# Patient Record
Sex: Male | Born: 1950 | Race: White | Hispanic: No | Smoking: Current every day smoker
Health system: Southern US, Community
[De-identification: ages and names within clinical notes are randomized; demographics above are authoritative.]

## PROBLEM LIST (undated history)

## (undated) DIAGNOSIS — R809 Proteinuria, unspecified: Secondary | ICD-10-CM

## (undated) DIAGNOSIS — I10 Essential (primary) hypertension: Secondary | ICD-10-CM

## (undated) DIAGNOSIS — I255 Ischemic cardiomyopathy: Secondary | ICD-10-CM

## (undated) DIAGNOSIS — N189 Chronic kidney disease, unspecified: Secondary | ICD-10-CM

## (undated) DIAGNOSIS — E119 Type 2 diabetes mellitus without complications: Secondary | ICD-10-CM

## (undated) DIAGNOSIS — I2510 Atherosclerotic heart disease of native coronary artery without angina pectoris: Secondary | ICD-10-CM

## (undated) DIAGNOSIS — E875 Hyperkalemia: Secondary | ICD-10-CM

## (undated) DIAGNOSIS — E785 Hyperlipidemia, unspecified: Secondary | ICD-10-CM

## (undated) HISTORY — DX: Hyperlipidemia, unspecified: E78.5

## (undated) HISTORY — PX: SHOULDER SURGERY: SHX246

## (undated) HISTORY — DX: Type 2 diabetes mellitus without complications: E11.9

## (undated) HISTORY — DX: Essential (primary) hypertension: I10

## (undated) HISTORY — DX: Chronic kidney disease, unspecified: N18.9

---

## 1997-05-08 ENCOUNTER — Inpatient Hospital Stay: Admission: AD | Admit: 1997-05-08 | Discharge: 1997-05-11 | Payer: Self-pay | Admitting: Cardiology

## 2003-02-24 ENCOUNTER — Ambulatory Visit (HOSPITAL_BASED_OUTPATIENT_CLINIC_OR_DEPARTMENT_OTHER): Admission: RE | Admit: 2003-02-24 | Discharge: 2003-02-24 | Payer: Self-pay | Admitting: Orthopaedic Surgery

## 2003-02-24 ENCOUNTER — Ambulatory Visit (HOSPITAL_COMMUNITY): Admission: RE | Admit: 2003-02-24 | Discharge: 2003-02-24 | Payer: Self-pay | Admitting: Orthopaedic Surgery

## 2010-05-09 ENCOUNTER — Inpatient Hospital Stay (HOSPITAL_COMMUNITY)
Admission: EM | Admit: 2010-05-09 | Discharge: 2010-05-16 | DRG: 247 | Disposition: A | Discharge status: Home / Self Care | Payer: 59 | Source: Ambulatory Visit | Attending: Cardiology | Admitting: Cardiology

## 2010-05-09 DIAGNOSIS — IMO0001 Reserved for inherently not codable concepts without codable children: Secondary | ICD-10-CM | POA: Diagnosis present

## 2010-05-09 DIAGNOSIS — I252 Old myocardial infarction: Secondary | ICD-10-CM

## 2010-05-09 DIAGNOSIS — F17200 Nicotine dependence, unspecified, uncomplicated: Secondary | ICD-10-CM | POA: Diagnosis present

## 2010-05-09 DIAGNOSIS — Z79899 Other long term (current) drug therapy: Secondary | ICD-10-CM

## 2010-05-09 DIAGNOSIS — I10 Essential (primary) hypertension: Secondary | ICD-10-CM | POA: Diagnosis present

## 2010-05-09 DIAGNOSIS — I2510 Atherosclerotic heart disease of native coronary artery without angina pectoris: Secondary | ICD-10-CM | POA: Diagnosis present

## 2010-05-09 DIAGNOSIS — Z7982 Long term (current) use of aspirin: Secondary | ICD-10-CM

## 2010-05-09 DIAGNOSIS — E7800 Pure hypercholesterolemia, unspecified: Secondary | ICD-10-CM | POA: Diagnosis present

## 2010-05-09 DIAGNOSIS — I2119 ST elevation (STEMI) myocardial infarction involving other coronary artery of inferior wall: Principal | ICD-10-CM | POA: Diagnosis present

## 2010-05-09 DIAGNOSIS — Z8249 Family history of ischemic heart disease and other diseases of the circulatory system: Secondary | ICD-10-CM

## 2010-05-09 LAB — LIPID PANEL
Cholesterol: 209 mg/dL — ABNORMAL HIGH (ref 0–200)
LDL Cholesterol: UNDETERMINED mg/dL (ref 0–99)

## 2010-05-09 LAB — COMPREHENSIVE METABOLIC PANEL
Alkaline Phosphatase: 65 U/L (ref 39–117)
BUN: 18 mg/dL (ref 6–23)
Chloride: 97 mEq/L (ref 96–112)
GFR calc non Af Amer: >60 mL/min (ref >60)
Glucose, Bld: 267 mg/dL — ABNORMAL HIGH (ref 70–99)
Potassium: 3.6 mEq/L (ref 3.5–5.1)
Total Bilirubin: 0.4 mg/dL (ref 0.3–1.2)

## 2010-05-09 LAB — PROTIME-INR: INR: 3.98 — ABNORMAL HIGH (ref 0.00–1.49)

## 2010-05-09 LAB — MRSA PCR SCREENING: MRSA by PCR: NEGATIVE

## 2010-05-09 LAB — CBC
HCT: 42.7 % (ref 39.0–52.0)
MCHC: 35.6 g/dL (ref 30.0–36.0)
RDW: 11.9 % (ref 11.5–15.5)

## 2010-05-09 LAB — CARDIAC PANEL(CRET KIN+CKTOT+MB+TROPI)
CK, MB: 6.9 ng/mL (ref 0.3–4.0)
Total CK: 87 U/L (ref 7–232)

## 2010-05-10 LAB — BASIC METABOLIC PANEL
BUN: 17 mg/dL (ref 6–23)
CO2: 24 mEq/L (ref 19–32)
Chloride: 105 mEq/L (ref 96–112)
Creatinine, Ser: 0.74 mg/dL (ref 0.4–1.5)
GFR calc Af Amer: >60 mL/min (ref >60)
Potassium: 3.4 mEq/L — ABNORMAL LOW (ref 3.5–5.1)

## 2010-05-10 LAB — CARDIAC PANEL(CRET KIN+CKTOT+MB+TROPI)
Relative Index: INVALID (ref 0.0–2.5)
Total CK: 78 U/L (ref 7–232)
Troponin I: 1.85 ng/mL (ref <0.30)

## 2010-05-10 LAB — GLUCOSE, CAPILLARY: Glucose-Capillary: 221 mg/dL — ABNORMAL HIGH (ref 70–99)

## 2010-05-10 LAB — POCT I-STAT, CHEM 8
Calcium, Ion: 1.28 mmol/L (ref 1.12–1.32)
Chloride: 105 mEq/L (ref 96–112)
Creatinine, Ser: 1 mg/dL (ref 0.4–1.5)
Glucose, Bld: 278 mg/dL — ABNORMAL HIGH (ref 70–99)
HCT: 46 % (ref 39.0–52.0)

## 2010-05-10 LAB — HEMOGLOBIN A1C: Hgb A1c MFr Bld: 11.3 % — ABNORMAL HIGH (ref <5.7)

## 2010-05-10 LAB — CBC
MCH: 31.9 pg (ref 26.0–34.0)
MCHC: 34.5 g/dL (ref 30.0–36.0)
Platelets: 132 10*3/uL — ABNORMAL LOW (ref 150–400)

## 2010-05-10 LAB — CK TOTAL AND CKMB (NOT AT ARMC): Relative Index: INVALID (ref 0.0–2.5)

## 2010-05-10 NOTE — Cardiovascular Report (Signed)
NAME:  YANDRIEL, BEHM              ACCOUNT NO.:  [id number]  MEDICAL RECORD NO.:  [id number]           PATIENT TYPE:  I  LOCATION:  2905                         FACILITY:  [facility]  PHYSICIAN:  Allegra Lai. Terrence Dupont, M.D. DATE OF BIRTH:  [birth year]  DATE OF PROCEDURE:  05/09/2010 DATE OF DISCHARGE:                           CARDIAC CATHETERIZATION   PROCEDURES: 1. Left cardiac cath with selective left and right coronary     angiography via right groin using Judkins technique. 2. Successful PTCA to mid RCA using 2.5 x 12 mm long mini Trek     balloon. 3. Successful deployment of 4.0 x 24 mm long Promus Element drug-     eluting stent in mid RCA. 4. Successful postdilatation of this stent using 4.0 x 15 mm long [place]     Trek balloon.  INDICATIONS FOR PROCEDURE:  Mr. Sahaj Raj is a 60 year old white male with past medical history significant for coronary artery disease, history of MI approximately 13 years ago, hypertension, non-insulin- dependent diabetes mellitus, hypercholesteremia, morbid obesity, tobacco abuse, positive family history of coronary artery disease.  Father died at the age of 102 due to MI.  He came to the [place] from [facility] by EMS, complaining of recurrent retrosternal chest pain, radiating to the left arm, associated with diaphoresis and mild shortness of breath since 1 a.m.  He states chest pain resolved in 15-20 minutes, did not seek medical attention, and again this afternoon had similar chest pain associated with nausea, diaphoresis, chest pain radiating to the left shoulder and left arm.  He took 4 baby aspirin with relief of chest pain.  He states stopped all his medications except metformin and aspirin.  PAST MEDICAL HISTORY:  As above.  PAST SURGICAL HISTORY:  He had left shoulder surgery in the past.  ALLERGIES:  No known drug allergies.  MEDICATIONS AT HOME:  He takes aspirin and metformin.  SOCIAL HISTORY:  He is  married, smokes 1-1/2 pack per day.  No history of alcohol abuse.  Works as Games developer.  FAMILY HISTORY:  Positive for coronary artery disease,  PHYSICAL EXAMINATION:  GENERAL:  He was alert, awake, and oriented x3, in no acute distress. VITAL SIGNS:  Blood pressure was 154/90, pulse was 96 and regular. HEENT:  Conjunctivae pink. NECK:  Supple.  No JVD.  No bruit. LUNGS:  Clear to auscultation. CARDIOVASCULAR:  S1 and S2 was normal.  There was soft systolic murmur and S4 gallop. ABDOMEN:  Soft.  Bowel sounds were present, nontender. EXTREMITIES:  There is no clubbing, cyanosis, or edema.  EKG done on the field showed sinus tachycardia with minimal ST elevation in inferior leads with no significant reciprocal changes, also had poor R-wave progression in V1-V3.  Discussed with the patient regarding emergency left cath, possible PTCA stenting, its risks and benefits i.e. death, MI, stroke, need for emergency CABG, local vascular complications, etc., and consented for PCI.  DESCRIPTION OF PROCEDURE:  After obtaining the informed consent, the patient was brought to the cath lab and was placed on fluoroscopy table. The right groin was prepped and draped in usual fashion.  A 1% Xylocaine was used for local anesthesia in the right groin.  With the help of thin- wall needle, a 6-French arterial sheath was placed.  The sheath was aspirated and flushed.  Next, a 6-French right Judkins catheter was advanced over the wire under fluoroscopic guidance up to the ascending aorta.  Wire was pulled out.  The catheter was aspirated and connected to the manifold.  Catheter was further advanced and engaged into right coronary ostium.  The single view of right coronary artery was obtained. Next, the catheter was disengaged and was pulled out over the wire and was replaced with 6-French left Judkins catheter which was advanced over the wire under fluoroscopic guidance up to the ascending aorta.  Wire was  pulled out.  The catheter was aspirated and connected to the manifold.  Catheter was further advanced and engaged into left coronary ostium.  Multiple views of the left system were taken.  Next, the catheter was disengaged and was pulled out over the wire and was replaced with 6-French pigtail catheter which was advanced over the wire under fluoroscopic guidance up to the ascending aorta.  At the end of the procedure, catheter was further advanced across the aortic valve into the LV.  LV pressures were recorded.  Next, LV graft was done in 30- degree RAO position.  Post angiographic pressures were recorded from LV and then pullback pressures were recorded from aorta.  There was no gradient across aortic valve.  Next, the pigtail catheter was pulled out over the wire.  Sheaths were aspirated and flushed.  FINDINGS:  LV showed anterolateral and inferior wall hypokinesia, LVH, EF of 30-35%.  Left main was patent.  The LAD has 20-25% proximal stenosis and 70-75% proximal and mid junction focal stenosis.  Diagonal 1 is small which has 30-40% stenosis.  Diagonal 2 and 3 were very small. Left circumflex has 30-40% proximal stenosis.  OM-1 is very, very small. OM-2 is moderate size which has 20% proximal and 30% mid stenosis.  RCA has 20-25% proximal stenosis and 95-99% mid stenosis with ruptured plaque with haziness with TIMI grade 2 plus distal flow.  INTERVENTIONAL PROCEDURE:  Successful PTCA to mid RCA was done using 2.5 x 12 mm long mini Trek balloon for predilatation and then 4.0 x 24 mm long Promus Element Plus drug-eluting stent was deployed at 11 atmospheric pressure.  Stent was postdilated using 4.0 x 15 mm long [place] Trek balloon, going up to 15 atmospheric pressure.  Lesion dilated from 95 to 99% with ruptured plaque and thrombosed to 0% residual with excellent TIMI grade 3 distal flow without evidence of dissection or distal embolization.  The patient received weight-based Angiomax and  60 mg of Effient and also 5 mg of IV Lopressor q.5 minutes x2 during the procedure.  The patient tolerated procedure well.  There were no complications.  The patient was transferred to CCU in stable condition. Total balloon time was 24 minutes.     Allegra Lai. Terrence Dupont, M.D.     MNH/MEDQ  D:  05/09/2010  T:  05/10/2010  Job:  [id number]  Electronically Signed by Charolette Forward M.D. on 05/10/2010 08:39:02 AM

## 2010-05-11 LAB — GLUCOSE, CAPILLARY
Glucose-Capillary: 183 mg/dL — ABNORMAL HIGH (ref 70–99)
Glucose-Capillary: 198 mg/dL — ABNORMAL HIGH (ref 70–99)
Glucose-Capillary: 172 mg/dL — ABNORMAL HIGH (ref 70–99)

## 2010-05-11 LAB — BASIC METABOLIC PANEL
Chloride: 103 mEq/L (ref 96–112)
GFR calc Af Amer: >60 mL/min (ref >60)
GFR calc non Af Amer: >60 mL/min (ref >60)
Potassium: 3.6 mEq/L (ref 3.5–5.1)
Sodium: 136 mEq/L (ref 135–145)

## 2010-05-11 LAB — CARDIAC PANEL(CRET KIN+CKTOT+MB+TROPI)
CK, MB: 3 ng/mL (ref 0.3–4.0)
Total CK: 44 U/L (ref 7–232)

## 2010-05-11 LAB — CBC
HCT: 41.3 % (ref 39.0–52.0)
Hemoglobin: 14.2 g/dL (ref 13.0–17.0)
MCH: 31.6 pg (ref 26.0–34.0)
MCV: 91.8 fL (ref 78.0–100.0)
Platelets: 124 10*3/uL — ABNORMAL LOW (ref 150–400)
RBC: 4.5 MIL/uL (ref 4.22–5.81)

## 2010-05-12 LAB — GLUCOSE, CAPILLARY
Glucose-Capillary: 195 mg/dL — ABNORMAL HIGH (ref 70–99)
Glucose-Capillary: 111 mg/dL — ABNORMAL HIGH (ref 70–99)

## 2010-05-13 LAB — GLUCOSE, CAPILLARY
Glucose-Capillary: 200 mg/dL — ABNORMAL HIGH (ref 70–99)
Glucose-Capillary: 279 mg/dL — ABNORMAL HIGH (ref 70–99)

## 2010-05-14 LAB — GLUCOSE, CAPILLARY
Glucose-Capillary: 194 mg/dL — ABNORMAL HIGH (ref 70–99)
Glucose-Capillary: 201 mg/dL — ABNORMAL HIGH (ref 70–99)

## 2010-05-14 LAB — CK TOTAL AND CKMB (NOT AT ARMC)
CK, MB: 1.7 ng/mL (ref 0.3–4.0)
Relative Index: INVALID (ref 0.0–2.5)
Total CK: 43 U/L (ref 7–232)

## 2010-05-14 LAB — POCT ACTIVATED CLOTTING TIME: Activated Clotting Time: 364 seconds

## 2010-05-15 LAB — BASIC METABOLIC PANEL
CO2: 24 mEq/L (ref 19–32)
Calcium: 9.2 mg/dL (ref 8.4–10.5)
Creatinine, Ser: 0.68 mg/dL (ref 0.4–1.5)
GFR calc Af Amer: >60 mL/min (ref >60)
GFR calc non Af Amer: >60 mL/min (ref >60)
Sodium: 138 mEq/L (ref 135–145)

## 2010-05-15 LAB — CBC
MCH: 31.8 pg (ref 26.0–34.0)
MCHC: 34.9 g/dL (ref 30.0–36.0)
Platelets: 147 10*3/uL — ABNORMAL LOW (ref 150–400)
RDW: 11.7 % (ref 11.5–15.5)

## 2010-05-15 LAB — GLUCOSE, CAPILLARY
Glucose-Capillary: 253 mg/dL — ABNORMAL HIGH (ref 70–99)
Glucose-Capillary: 173 mg/dL — ABNORMAL HIGH (ref 70–99)

## 2010-05-27 NOTE — Cardiovascular Report (Signed)
NAME:  Austin Weber, Austin Weber              ACCOUNT NO.:  [id number]  MEDICAL RECORD NO.:  [id number]           PATIENT TYPE:  I  LOCATION:  6533                         FACILITY:  [facility]  PHYSICIAN:  Wadell Craddock N. Terrence Dupont, M.D. DATE OF BIRTH:  Jun 09, 1950  DATE OF PROCEDURE:  05/14/2010 DATE OF DISCHARGE:                           CARDIAC CATHETERIZATION   PROCEDURE: 1. Left cardiac cath with selective left and right coronary     angiography via right groin using Judkins technique. 2. Successful PTCA to proximal and mid junction of LAD using 2.5 x 8-     mm long mini Trek balloon. 3. Successful deployment of 3.0 x 16-mm long thrombosed element Plus     drug-eluting stent in proximal and mid junction of LAD. 4. Successful postdilatation of this stent using 3.5 x 12-mm long [place]     Trek balloon going up to 20 atmospheric pressure.  INDICATIONS FOR PROCEDURE:  Austin Weber is a 60 year old white male with past medical history significant for coronary artery disease, history of MI approximately 13 years ago, hypertension, non-insulin-dependent diabetes mellitus, hypercholesteremia, morbid obesity, tobacco abuse, positive family history of coronary artery disease who was admitted on May 09, 2010, as code STEMI was called and the patient was noted to have inferior wall MI requiring emergency PCI to mid RCA.  The patient was noted to have critical stenosis in proximal LAD.  The patient is brought to the cath lab for staged PCI to LAD.  After obtaining the informed consent, the patient was brought to the cath lab and was placed on fluoroscopy table.  Right groin was prepped and draped in usual fashion.  Xylocaine 1% was used for local anesthesia in the right groin.  With the help of thin-wall needle, 6-French arterial sheath was placed.  The sheath was aspirated and flushed. Next, a 6-French right Judkins catheter was advanced over the wire under fluoroscopic guidance up to the ascending aorta.  Wire  was pulled out, the catheter was aspirated and connected to the manifold.  Catheter was further advanced and engaged into right coronary ostium.  The single view of right coronary artery was obtained.  Next, the catheter was disengaged and was pulled out over the wire and was replaced with 6- [place] XB LAD guiding catheter, which was advanced over the wire under fluoroscopic guidance up to the ascending aorta.  Wire was pulled out, the catheter was aspirated and connected to the manifold.  Catheter was further advanced and engaged into left coronary ostium.  Multiple views of the left system were taken.  FINDINGS:  RCA was patent at prior PTCA and stented site.  LAD had 75- 80% proximal and mid junction stenosis as before.  INTERVENTIONAL PROCEDURE:  Successful PTCA to proximal and mid junction of LAD was done using 2.5 x 8-mm long mini Trek balloon for predilatation and then 3.0 x 16-mm long thrombosed element Plus drug- eluting stent was deployed in proximal and mid junction of LAD at 11 atmospheric pressure.  Stent was postdilated using 3.5 x 12-mm long [place] Trek balloon going up to 20 atmospheric pressure.  Lesion dilated from 75-80% to 0%  residual with excellent TIMI grade 3 distal flow without evidence of dissection or distal embolization.  The patient received weight-based Angiomax and 30 mg of prasugrel during the procedure.  Arteriotomy was brought by [facility] without difficulty with good hemostasis.  The patient tolerated procedure well.  There were no complications.  The patient was transferred to recovery room in stable condition.     Allegra Lai. Terrence Dupont, M.D.     MNH/MEDQ  D:  05/15/2010  T:  05/16/2010  Job:  [id number]  Electronically Signed by Charolette Forward M.D. on 05/27/2010 08:55:40 PM

## 2010-05-27 NOTE — Discharge Summary (Signed)
NAME:  Austin Weber, Austin Weber              ACCOUNT NO.:  [id number]  MEDICAL RECORD NO.:  [id number]           PATIENT TYPE:  I  LOCATION:  6533                         FACILITY:  [facility]  PHYSICIAN:  Kadeem Hyle N. Terrence Dupont, M.D. DATE OF BIRTH:  1950/10/20  DATE OF ADMISSION:  05/09/2010 DATE OF DISCHARGE:  05/16/2010                              DISCHARGE SUMMARY   DATE OF TENTATIVE DISCHARGE:  May 16, 2010.  ADMITTING DIAGNOSES: 1. Acute inferior wall myocardial infarction. 2. Coronary artery disease. 3. History of myocardial infarction in the past. 4. Uncontrolled hypertension. 5. Uncontrolled diabetes mellitus. 6. Hypercholesteremia. 7. Morbid obesity.  DISCHARGE DIAGNOSES: 1. Status post inferior wall myocardial infarction. 2. Status post percutaneous coronary intervention to right coronary     artery. 3. Multivessel coronary artery disease, status post percutaneous     transluminal coronary angioplasty/stenting to left anterior     descending. 4. Hypertension. 5. Diabetes mellitus. 6. Hypercholesteremia. 7. Tobacco abuse. 8. Morbid obesity.  DISCHARGE HOME MEDICATIONS: 1. Enteric-coated aspirin 325 mg 1 tablet daily. 2. Prasugrel 10 mg 1 tablet daily. 3. Ramipril 5 mg one capsule twice daily. 4. Carvedilol 6.25 mg 1 tablet twice daily. 5. Crestor 40 mg 1 tablet daily. 6. Metformin 500 mg 1 tablet twice daily. 7. Nitrostat 0.4 mg sublingual q.5 minutes as needed. 8. Avelox 400 mg 1 tablet daily for 5 days.  DIET:  Low-salt, low-cholesterol, 1800-calorie ADA diet.  The patient has been advised to monitor blood sugar and blood pressure daily.  Follow up with me in 1 week.  CONDITION AT DISCHARGE:  Stable.  Post PTCA stent instructions have been given.  BRIEF HISTORY AND HOSPITAL COURSE:  Austin Weber is a 60 year old white male with past medical history significant for coronary artery disease, history of MI approximately 13 years ago, hypertension, non-insulin- dependent  diabetes mellitus, hypercholesteremia, morbid obesity, tobacco abuse, positive family history of coronary artery disease.  He came to [place] from [facility] by EMS complaining of recurrent retrosternal chest pain radiating to the left arm associated with diaphoresis and mild shortness of breath since 1:00 a.m. States chest pain resolved in 15-20 minutes, did not seek any medical attention, and again this afternoon had similar chest pain associated with nausea, diaphoresis, radiating to left shoulder and left arm, four baby aspirin with relief of chest pain.  States stopped all his medication except metformin and aspirin.  PAST MEDICAL HISTORY:  As above.  PAST SURGICAL HISTORY:  He had left shoulder surgery in the past.  ALLERGIES:  No known drug allergies.  MEDICATION AT HOME:  He takes aspirin and metformin.  SOCIAL HISTORY:  He is married.  Smokes one and a half packs per day for many years.  No history of alcohol abuse.  Works as a Games developer.  FAMILY HISTORY:  Positive for coronary artery disease.  Father died of MI at the age of 67.  PHYSICAL EXAMINATION:  GENERAL:  He was alert, awake, and oriented x3 in no acute distress. VITAL SIGNS:  Blood pressure was 154/90, pulse was 96 and regular. HEENT:  Conjunctivae were pink. NECK:  Supple.  No JVD, no bruit. LUNGS:  Clear to auscultation without rhonchi or rales. CARDIOVASCULAR:  S1 and S2 were normal.  There was soft systolic murmur and S4 gallop. ABDOMEN:  Soft.  Bowel sounds were present, nontender. EXTREMITIES:  There is no clubbing, cyanosis, or edema.  EKG showed normal sinus rhythm with sinus tachycardia, minimal ST elevation in inferior leads with no significant reciprocal changes, and also poor R-wave progression in V1-V3.  LABORATORY DATA:  Hemoglobin was 15.2, hematocrit 42.7, white count of 8.3.  Sodium was 129, potassium 3.6, glucose 267, BUN 18, creatinine 0.85.  his cholesterol  was 209, triglycerides 490, HDL 28, CK was 87, MB 6.9, troponin-I was 1.27.  Repeat troponin-I was 2.71.  On May 3, CK 78, MB 7.0, troponin-I was 1.85.  On May 4, CK 44, MB 3.0, troponin-I was 1.07.  On May 7 post PCI, CK 43, MB 1.7.  His hemoglobin A1c was 11.3. His labs today, hemoglobin is 14.7, hematocrit 42.1, white count of 7.1, platelet count 147,000.  Sodium is 138, potassium 3.6, BUN 10, creatinine 0.68, glucose is 148.  BRIEF HOSPITAL COURSE:  The patient was directly brought to the [facility] and underwent emergency left cath and PTCA/stenting to RCA as per procedure report.  The patient tolerated the procedure well.  The patient was noted to have critical proximal LAD stenosis with some haziness.  The patient did not have any episodes of chest pain during the hospital stay.  Phase I Cardiac Rehab was called and the patient subsequently underwent PTCA/stenting to proximal LAD in view of critical stenosis with haziness and poor LV systolic function. The patient tolerated the procedure well.  There were no complications. Postprocedure, the patient has been ambulating in hallway without any problems.  The patient will be discharged home in morning and will be scheduled for Phase II Cardiac Rehab as outpatient.     Allegra Lai. Terrence Dupont, M.D.     MNH/MEDQ  D:  05/15/2010  T:  05/16/2010  Job:  [id number]  Electronically Signed by Charolette Forward M.D. on 05/27/2010 08:55:36 PM

## 2015-02-23 DIAGNOSIS — F17210 Nicotine dependence, cigarettes, uncomplicated: Secondary | ICD-10-CM | POA: Insufficient documentation

## 2018-08-24 ENCOUNTER — Other Ambulatory Visit: Payer: Self-pay

## 2018-08-25 ENCOUNTER — Encounter: Payer: Self-pay | Admitting: Family Medicine

## 2018-08-25 ENCOUNTER — Ambulatory Visit (INDEPENDENT_AMBULATORY_CARE_PROVIDER_SITE_OTHER): Payer: Medicare Other | Admitting: Family Medicine

## 2018-08-25 DIAGNOSIS — N529 Male erectile dysfunction, unspecified: Secondary | ICD-10-CM | POA: Diagnosis not present

## 2018-08-25 DIAGNOSIS — I10 Essential (primary) hypertension: Secondary | ICD-10-CM | POA: Insufficient documentation

## 2018-08-25 DIAGNOSIS — E1159 Type 2 diabetes mellitus with other circulatory complications: Secondary | ICD-10-CM

## 2018-08-25 DIAGNOSIS — E785 Hyperlipidemia, unspecified: Secondary | ICD-10-CM

## 2018-08-25 DIAGNOSIS — E1169 Type 2 diabetes mellitus with other specified complication: Secondary | ICD-10-CM | POA: Insufficient documentation

## 2018-08-25 MED ORDER — METFORMIN HCL ER 500 MG PO TB24: 1000.0000 mg | ORAL_TABLET | Freq: Two times a day (BID) | ORAL | 3 refills | Status: DC

## 2018-08-25 MED ORDER — CARVEDILOL 6.25 MG PO TABS: 6.2500 mg | ORAL_TABLET | Freq: Three times a day (TID) | ORAL | 3 refills | Status: DC

## 2018-08-25 MED ORDER — REPAGLINIDE 2 MG PO TABS: 2.0000 mg | ORAL_TABLET | Freq: Three times a day (TID) | ORAL | 11 refills | Status: DC

## 2018-08-25 MED ORDER — ATORVASTATIN CALCIUM 20 MG PO TABS: 20.0000 mg | ORAL_TABLET | Freq: Every day | ORAL | 3 refills | Status: DC

## 2018-08-25 MED ORDER — LOSARTAN POTASSIUM 100 MG PO TABS: 100.0000 mg | ORAL_TABLET | Freq: Every day | ORAL | 11 refills | Status: DC

## 2018-08-25 MED ORDER — TADALAFIL 5 MG PO TABS: 5.0000 mg | ORAL_TABLET | Freq: Every day | ORAL | 3 refills | Status: DC

## 2018-08-25 MED ORDER — GLIPIZIDE ER 10 MG PO TB24: 10.0000 mg | ORAL_TABLET | Freq: Two times a day (BID) | ORAL | 3 refills | Status: DC

## 2018-08-25 NOTE — Progress Notes (Signed)
BP (!) 188/111   Pulse 100   Temp 98.7 F (37.1 C) (Temporal)   Ht 6' (1.829 m)   Wt 210 lb (95.3 kg)   BMI 28.48 kg/m    Subjective:    Patient ID: Austin Weber, male    DOB: Jun 21, 1950, 68 y.o.   MRN: [id number]  HPI: Austin Weber is a 68 y.o. male presenting on 08/25/2018 for New Patient (Initial Visit) ([place] ) and Establish Care   HPI Type 2 diabetes mellitus Patient comes in today for recheck of his diabetes. Patient has been currently taking glipizide and metformin and Prandin. Patient is currently on an ACE inhibitor/ARB. Patient has not seen an ophthalmologist this year. Patient denies any issues with their feet.   Hypertension Patient is currently on losartan and carvedilol, and their blood pressure today is 188/111, patient says at home they are running in the 120s 130s and 140s and no higher.. Patient denies any lightheadedness or dizziness. Patient denies headaches, blurred vision, chest pains, shortness of breath, or weakness. Denies any side effects from medication and is content with current medication.   Hyperlipidemia Patient is coming in for recheck of his hyperlipidemia. The patient is currently taking atorvastatin. They deny any issues with myalgias or history of liver damage from it. They deny any focal numbness or weakness or chest pain.   Erectile dysfunction Patient currently takes Cialis 5 mg daily for erectile dysfunction.  Patient says it works very well for him and denies any side effects from it.  Relevant past medical, surgical, family and social history reviewed and updated as indicated. Interim medical history since our last visit reviewed. Allergies and medications reviewed and updated.  Review of Systems  Constitutional: Negative for chills and fever.  Eyes: Negative for visual disturbance.  Respiratory: Negative for shortness of breath and wheezing.   Cardiovascular: Negative for chest pain and leg swelling.  Musculoskeletal: Negative  for back pain and gait problem.  Skin: Negative for rash.  Neurological: Negative for dizziness, weakness, light-headedness and numbness.  All other systems reviewed and are negative.   Per HPI unless specifically indicated above  Social History   Socioeconomic History  . Marital status: Married    Spouse name: Not on file  . Number of children: Not on file  . Years of education: Not on file  . Highest education level: Not on file  Occupational History  . Not on file  Social Needs  . Financial resource strain: Not on file  . Food insecurity    Worry: Not on file    Inability: Not on file  . Transportation needs    Medical: Not on file    Non-medical: Not on file  Tobacco Use  . Smoking status: Current Every Day Smoker    Packs/day: 0.50    Types: Cigarettes  . Smokeless tobacco: Never Used  Substance and Sexual Activity  . Alcohol use: Never    Frequency: Never  . Drug use: Never  . Sexual activity: Not on file  Lifestyle  . Physical activity    Days per week: Not on file    Minutes per session: Not on file  . Stress: Not on file  Relationships  . Social Herbalist on phone: Not on file    Gets together: Not on file    Attends religious service: Not on file    Active member of club or organization: Not on file    Attends meetings of clubs or  organizations: Not on file    Relationship status: Not on file  . Intimate partner violence    Fear of current or ex partner: Not on file    Emotionally abused: Not on file    Physically abused: Not on file    Forced sexual activity: Not on file  Other Topics Concern  . Not on file  Social History Narrative  . Not on file    Past Surgical History:  Procedure Laterality Date  . SHOULDER SURGERY Left     Family History  Problem Relation Age of Onset  . Heart disease Father     Allergies as of 08/25/2018   Not on File     Medication List       Accurate as of August 25, 2018  9:56 AM. If you have  any questions, ask your nurse or doctor.        aspirin EC 81 MG tablet Take 81 mg by mouth 2 (two) times daily.   atorvastatin 20 MG tablet Commonly known as: LIPITOR Take 1 tablet by mouth daily.   carvedilol 6.25 MG tablet Commonly known as: COREG Take 1 tablet by mouth 3 (three) times daily.   glipiZIDE 10 MG 24 hr tablet Commonly known as: GLUCOTROL XL Take 1 tablet by mouth 2 (two) times daily.   losartan 100 MG tablet Commonly known as: COZAAR Take 1 tablet by mouth daily.   metFORMIN 500 MG 24 hr tablet Commonly known as: GLUCOPHAGE-XR Take 2 tablets by mouth 2 (two) times daily.   repaglinide 2 MG tablet Commonly known as: PRANDIN Take 1 tablet by mouth 3 (three) times daily.   tadalafil 5 MG tablet Commonly known as: CIALIS Take 1 tablet by mouth daily.          Objective:    BP (!) 188/111   Pulse 100   Temp 98.7 F (37.1 C) (Temporal)   Ht 6' (1.829 m)   Wt 210 lb (95.3 kg)   BMI 28.48 kg/m   Wt Readings from Last 3 Encounters:  08/25/18 210 lb (95.3 kg)    Physical Exam Vitals signs and nursing note reviewed.  Constitutional:      General: He is not in acute distress.    Appearance: He is well-developed. He is not diaphoretic.  Eyes:     General: No scleral icterus.    Conjunctiva/sclera: Conjunctivae normal.  Neck:     Musculoskeletal: Neck supple.     Thyroid: No thyromegaly.  Cardiovascular:     Rate and Rhythm: Normal rate and regular rhythm.     Heart sounds: Normal heart sounds. No murmur.  Pulmonary:     Effort: Pulmonary effort is normal. No respiratory distress.     Breath sounds: Normal breath sounds. No wheezing.  Musculoskeletal: Normal range of motion.  Lymphadenopathy:     Cervical: No cervical adenopathy.  Skin:    General: Skin is warm and dry.     Findings: No rash.  Neurological:     Mental Status: He is alert and oriented to person, place, and time.     Coordination: Coordination normal.  Psychiatric:         Behavior: Behavior normal.         Assessment & Plan:   Problem List Items Addressed This Visit      Cardiovascular and Mediastinum   Hypertension associated with diabetes ([place])   Relevant Medications   aspirin EC 81 MG tablet   atorvastatin (LIPITOR) 20 MG  tablet   carvedilol (COREG) 6.25 MG tablet   glipiZIDE (GLUCOTROL XL) 10 MG 24 hr tablet   losartan (COZAAR) 100 MG tablet   metFORMIN (GLUCOPHAGE-XR) 500 MG 24 hr tablet   repaglinide (PRANDIN) 2 MG tablet   tadalafil (CIALIS) 5 MG tablet   Other Relevant Orders   CMP14+EGFR     Endocrine   Type 2 diabetes mellitus with other specified complication (HCC)   Relevant Medications   aspirin EC 81 MG tablet   atorvastatin (LIPITOR) 20 MG tablet   glipiZIDE (GLUCOTROL XL) 10 MG 24 hr tablet   losartan (COZAAR) 100 MG tablet   metFORMIN (GLUCOPHAGE-XR) 500 MG 24 hr tablet   repaglinide (PRANDIN) 2 MG tablet   Other Relevant Orders   CBC with Differential/Platelet   Bayer DCA Hb A1c Waived   Hyperlipidemia associated with type 2 diabetes mellitus (HCC)   Relevant Medications   aspirin EC 81 MG tablet   atorvastatin (LIPITOR) 20 MG tablet   glipiZIDE (GLUCOTROL XL) 10 MG 24 hr tablet   losartan (COZAAR) 100 MG tablet   metFORMIN (GLUCOPHAGE-XR) 500 MG 24 hr tablet   repaglinide (PRANDIN) 2 MG tablet   Other Relevant Orders   Lipid panel     Other   Erectile dysfunction      Continue medication and recheck in 13mhs bp  Changes in blood pressure because home BPs are better, he will call in some numbers over the next 2 weeks. Follow up plan: Return in about 3 months (around 11/25/2018) for diabetes.  JCaryl Pina MD [facility] 08/25/2018, 9:56 AM

## 2018-11-26 ENCOUNTER — Other Ambulatory Visit: Payer: Medicare Other

## 2018-11-26 ENCOUNTER — Other Ambulatory Visit: Payer: Self-pay

## 2018-11-26 DIAGNOSIS — E1159 Type 2 diabetes mellitus with other circulatory complications: Secondary | ICD-10-CM

## 2018-11-26 DIAGNOSIS — E1169 Type 2 diabetes mellitus with other specified complication: Secondary | ICD-10-CM

## 2018-11-26 DIAGNOSIS — E785 Hyperlipidemia, unspecified: Secondary | ICD-10-CM

## 2018-11-26 LAB — BAYER DCA HB A1C WAIVED: HB A1C (BAYER DCA - WAIVED): 7 % — ABNORMAL HIGH (ref <7.0)

## 2018-11-27 ENCOUNTER — Encounter: Payer: Self-pay | Admitting: Family Medicine

## 2018-11-27 ENCOUNTER — Ambulatory Visit (INDEPENDENT_AMBULATORY_CARE_PROVIDER_SITE_OTHER): Payer: Medicare Other | Admitting: Family Medicine

## 2018-11-27 VITALS — BP 194/106 | HR 97 | Temp 98.0°F | Ht 73.0 in | Wt 209.8 lb

## 2018-11-27 DIAGNOSIS — E1169 Type 2 diabetes mellitus with other specified complication: Secondary | ICD-10-CM

## 2018-11-27 DIAGNOSIS — I10 Essential (primary) hypertension: Secondary | ICD-10-CM

## 2018-11-27 DIAGNOSIS — I152 Hypertension secondary to endocrine disorders: Secondary | ICD-10-CM

## 2018-11-27 DIAGNOSIS — E1159 Type 2 diabetes mellitus with other circulatory complications: Secondary | ICD-10-CM

## 2018-11-27 DIAGNOSIS — N179 Acute kidney failure, unspecified: Secondary | ICD-10-CM

## 2018-11-27 DIAGNOSIS — Z23 Encounter for immunization: Secondary | ICD-10-CM

## 2018-11-27 DIAGNOSIS — E785 Hyperlipidemia, unspecified: Secondary | ICD-10-CM

## 2018-11-27 LAB — CMP14+EGFR
ALT: 13 IU/L (ref 0–44)
AST: 12 IU/L (ref 0–40)
Albumin/Globulin Ratio: 1.8 (ref 1.2–2.2)
Albumin: 4.5 g/dL (ref 3.8–4.8)
Alkaline Phosphatase: 85 IU/L (ref 39–117)
BUN/Creatinine Ratio: 22 (ref 10–24)
BUN: 30 mg/dL — ABNORMAL HIGH (ref 8–27)
Bilirubin Total: 0.4 mg/dL (ref 0.0–1.2)
CO2: 18 mmol/L — ABNORMAL LOW (ref 20–29)
Calcium: 9.6 mg/dL (ref 8.6–10.2)
Chloride: 108 mmol/L — ABNORMAL HIGH (ref 96–106)
Creatinine, Ser: 1.35 mg/dL — ABNORMAL HIGH (ref 0.76–1.27)
GFR calc Af Amer: 61 mL/min/{1.73_m2} (ref >59)
GFR calc non Af Amer: 53 mL/min/{1.73_m2} — ABNORMAL LOW (ref >59)
Globulin, Total: 2.5 g/dL (ref 1.5–4.5)
Glucose: 106 mg/dL — ABNORMAL HIGH (ref 65–99)
Potassium: 4.8 mmol/L (ref 3.5–5.2)
Sodium: 142 mmol/L (ref 134–144)
Total Protein: 7 g/dL (ref 6.0–8.5)

## 2018-11-27 LAB — CBC WITH DIFFERENTIAL/PLATELET
Basophils Absolute: 0.1 10*3/uL (ref 0.0–0.2)
Basos: 1 %
EOS (ABSOLUTE): 0.2 10*3/uL (ref 0.0–0.4)
Eos: 3 %
Hematocrit: 45.5 % (ref 37.5–51.0)
Hemoglobin: 15.1 g/dL (ref 13.0–17.7)
Immature Grans (Abs): 0 10*3/uL (ref 0.0–0.1)
Immature Granulocytes: 0 %
Lymphocytes Absolute: 2.1 10*3/uL (ref 0.7–3.1)
Lymphs: 25 %
MCH: 31.1 pg (ref 26.6–33.0)
MCHC: 33.2 g/dL (ref 31.5–35.7)
MCV: 94 fL (ref 79–97)
Monocytes Absolute: 0.7 10*3/uL (ref 0.1–0.9)
Monocytes: 8 %
Neutrophils Absolute: 5.4 10*3/uL (ref 1.4–7.0)
Neutrophils: 63 %
Platelets: 184 10*3/uL (ref 150–450)
RBC: 4.85 x10E6/uL (ref 4.14–5.80)
RDW: 11.7 % (ref 11.6–15.4)
WBC: 8.5 10*3/uL (ref 3.4–10.8)

## 2018-11-27 LAB — LIPID PANEL
Chol/HDL Ratio: 3.8 ratio (ref 0.0–5.0)
Cholesterol, Total: 137 mg/dL (ref 100–199)
HDL: 36 mg/dL — ABNORMAL LOW (ref >39)
LDL Chol Calc (NIH): 74 mg/dL (ref 0–99)
Triglycerides: 156 mg/dL — ABNORMAL HIGH (ref 0–149)
VLDL Cholesterol Cal: 27 mg/dL (ref 5–40)

## 2018-11-27 MED ORDER — AMLODIPINE BESYLATE 5 MG PO TABS: 5.0000 mg | ORAL_TABLET | Freq: Every day | ORAL | 3 refills | Status: DC

## 2018-11-27 NOTE — Progress Notes (Signed)
BP (!) 202/102   Pulse 97   Temp 98 F (36.7 C) (Temporal)   Ht '6\' 1"'  (1.854 m)   Wt 209 lb 12.8 oz (95.2 kg)   SpO2 98%   BMI 27.68 kg/m    Subjective:   Patient ID: Austin Weber, male    DOB: 07-14-1950, 68 y.o.   MRN: [id number]  HPI: Austin Weber is a 68 y.o. male presenting on 11/27/2018 for Diabetes (3 month follow up)   HPI Type 2 diabetes mellitus Patient comes in today for recheck of his diabetes. Patient has been currently taking glipizide and metformin and repaglinide. Patient is currently on an ACE inhibitor/ARB. Patient has not seen an ophthalmologist this year. Patient denies any issues with their feet.   Hypertension Patient is currently on carvedilol and losartan, and their blood pressure today is 202/102. Patient denies any lightheadedness or dizziness. Patient denies headaches, blurred vision, chest pains, shortness of breath, or weakness. Denies any side effects from medication and is content with current medication.   Hyperlipidemia Patient is coming in for recheck of his hyperlipidemia. The patient is currently taking Lipitor. They deny any issues with myalgias or history of liver damage from it. They deny any focal numbness or weakness or chest pain.   Relevant past medical, surgical, family and social history reviewed and updated as indicated. Interim medical history since our last visit reviewed. Allergies and medications reviewed and updated.  Review of Systems  Constitutional: Negative for chills and fever.  Eyes: Negative for discharge.  Respiratory: Negative for shortness of breath and wheezing.   Cardiovascular: Negative for chest pain and leg swelling.  Musculoskeletal: Negative for back pain and gait problem.  Skin: Negative for rash.  All other systems reviewed and are negative.   Per HPI unless specifically indicated above   Allergies as of 11/27/2018   No Known Allergies     Medication List       Accurate as of November 27, 2018  8:55 AM. If you have any questions, ask your nurse or doctor.        amLODipine 5 MG tablet Commonly known as: NORVASC Take 1 tablet (5 mg total) by mouth daily. Started by: Worthy Rancher, MD   aspirin EC 81 MG tablet Take 81 mg by mouth 2 (two) times daily.   atorvastatin 20 MG tablet Commonly known as: LIPITOR Take 1 tablet (20 mg total) by mouth daily.   carvedilol 6.25 MG tablet Commonly known as: COREG Take 1 tablet (6.25 mg total) by mouth 3 (three) times daily.   glipiZIDE 10 MG 24 hr tablet Commonly known as: GLUCOTROL XL Take 1 tablet (10 mg total) by mouth 2 (two) times daily.   losartan 100 MG tablet Commonly known as: COZAAR Take 1 tablet (100 mg total) by mouth daily.   metFORMIN 500 MG 24 hr tablet Commonly known as: GLUCOPHAGE-XR Take 2 tablets (1,000 mg total) by mouth 2 (two) times daily.   repaglinide 2 MG tablet Commonly known as: PRANDIN Take 1 tablet (2 mg total) by mouth 3 (three) times daily.   tadalafil 5 MG tablet Commonly known as: CIALIS Take 1 tablet (5 mg total) by mouth daily.        Objective:   BP (!) 202/102   Pulse 97   Temp 98 F (36.7 C) (Temporal)   Ht '6\' 1"'  (1.854 m)   Wt 209 lb 12.8 oz (95.2 kg)   SpO2 98%   BMI 27.68 kg/m  Wt Readings from Last 3 Encounters:  11/27/18 209 lb 12.8 oz (95.2 kg)  08/25/18 210 lb (95.3 kg)    Physical Exam Vitals signs and nursing note reviewed.  Constitutional:      General: He is not in acute distress.    Appearance: He is well-developed. He is not diaphoretic.  Eyes:     General: No scleral icterus.    Conjunctiva/sclera: Conjunctivae normal.  Neck:     Musculoskeletal: Neck supple.     Thyroid: No thyromegaly.  Cardiovascular:     Rate and Rhythm: Normal rate and regular rhythm.     Heart sounds: Normal heart sounds. No murmur.  Pulmonary:     Effort: Pulmonary effort is normal. No respiratory distress.     Breath sounds: Normal breath sounds. No wheezing.   Musculoskeletal: Normal range of motion.  Lymphadenopathy:     Cervical: No cervical adenopathy.  Skin:    General: Skin is warm and dry.     Findings: No rash.  Neurological:     Mental Status: He is alert and oriented to person, place, and time.     Coordination: Coordination normal.  Psychiatric:        Behavior: Behavior normal.     Results for orders placed or performed in visit on 11/26/18  Lipid panel  Result Value Ref Range   Cholesterol, Total 137 100 - 199 mg/dL   Triglycerides 156 (H) 0 - 149 mg/dL   HDL 36 (L) >39 mg/dL   VLDL Cholesterol Cal 27 5 - 40 mg/dL   LDL Chol Calc (NIH) 74 0 - 99 mg/dL   Chol/HDL Ratio 3.8 0.0 - 5.0 ratio  CBC with Differential/Platelet  Result Value Ref Range   WBC 8.5 3.4 - 10.8 x10E3/uL   RBC 4.85 4.14 - 5.80 x10E6/uL   Hemoglobin 15.1 13.0 - 17.7 g/dL   Hematocrit 45.5 37.5 - 51.0 %   MCV 94 79 - 97 fL   MCH 31.1 26.6 - 33.0 pg   MCHC 33.2 31.5 - 35.7 g/dL   RDW 11.7 11.6 - 15.4 %   Platelets 184 150 - 450 x10E3/uL   Neutrophils 63 Not Estab. %   Lymphs 25 Not Estab. %   Monocytes 8 Not Estab. %   Eos 3 Not Estab. %   Basos 1 Not Estab. %   Neutrophils Absolute 5.4 1.4 - 7.0 x10E3/uL   Lymphocytes Absolute 2.1 0.7 - 3.1 x10E3/uL   Monocytes Absolute 0.7 0.1 - 0.9 x10E3/uL   EOS (ABSOLUTE) 0.2 0.0 - 0.4 x10E3/uL   Basophils Absolute 0.1 0.0 - 0.2 x10E3/uL   Immature Granulocytes 0 Not Estab. %   Immature Grans (Abs) 0.0 0.0 - 0.1 x10E3/uL  CMP14+EGFR  Result Value Ref Range   Glucose 106 (H) 65 - 99 mg/dL   BUN 30 (H) 8 - 27 mg/dL   Creatinine, Ser 1.35 (H) 0.76 - 1.27 mg/dL   GFR calc non Af Amer 53 (L) >59 mL/min/1.73   GFR calc Af Amer 61 >59 mL/min/1.73   BUN/Creatinine Ratio 22 10 - 24   Sodium 142 134 - 144 mmol/L   Potassium 4.8 3.5 - 5.2 mmol/L   Chloride 108 (H) 96 - 106 mmol/L   CO2 18 (L) 20 - 29 mmol/L   Calcium 9.6 8.6 - 10.2 mg/dL   Total Protein 7.0 6.0 - 8.5 g/dL   Albumin 4.5 3.8 - 4.8 g/dL    Globulin, Total 2.5 1.5 - 4.5 g/dL  Albumin/Globulin Ratio 1.8 1.2 - 2.2   Bilirubin Total 0.4 0.0 - 1.2 mg/dL   Alkaline Phosphatase 85 39 - 117 IU/L   AST 12 0 - 40 IU/L   ALT 13 0 - 44 IU/L  hgba1c  Result Value Ref Range   HB A1C (BAYER DCA - WAIVED) 7.0 (H) <7.0 %    Assessment & Plan:   Problem List Items Addressed This Visit      Cardiovascular and Mediastinum   Hypertension associated with diabetes ([place]) - Primary   Relevant Medications   amLODipine (NORVASC) 5 MG tablet     Endocrine   Type 2 diabetes mellitus with other specified complication (HCC)   Relevant Orders   hgba1c   CMP14+EGFR   BMP8+EGFR   Hyperlipidemia associated with type 2 diabetes mellitus ([place])   Relevant Orders   Lipid panel    Other Visit Diagnoses    AKI (acute kidney injury) ([place])       Relevant Orders   BMP8+EGFR      Will add amlodipine to his regiment, continue current medication for diabetes, A1c was 7.0.  The rest of his blood work looks pretty good, his renal function was up slightly and we will recheck that today  Follow up plan: Return in about 3 months (around 02/27/2019), or if symptoms worsen or fail to improve, for Hypertension and diabetes and cholesterol.  Counseling provided for all of the vaccine components Orders Placed This Encounter  Procedures  . hgba1c  . CMP14+EGFR  . Lipid panel    Caryl Pina, MD [facility] 11/27/2018, 8:55 AM

## 2019-03-03 ENCOUNTER — Other Ambulatory Visit: Payer: Self-pay

## 2019-03-03 ENCOUNTER — Other Ambulatory Visit: Payer: Medicare Other

## 2019-03-03 DIAGNOSIS — E785 Hyperlipidemia, unspecified: Secondary | ICD-10-CM | POA: Diagnosis not present

## 2019-03-03 DIAGNOSIS — E1169 Type 2 diabetes mellitus with other specified complication: Secondary | ICD-10-CM | POA: Diagnosis not present

## 2019-03-03 LAB — BAYER DCA HB A1C WAIVED: HB A1C (BAYER DCA - WAIVED): 7.3 % — ABNORMAL HIGH (ref <7.0)

## 2019-03-04 ENCOUNTER — Encounter: Payer: Self-pay | Admitting: Family Medicine

## 2019-03-04 ENCOUNTER — Ambulatory Visit (INDEPENDENT_AMBULATORY_CARE_PROVIDER_SITE_OTHER): Payer: Medicare Other | Admitting: Family Medicine

## 2019-03-04 VITALS — BP 195/93 | HR 100 | Temp 98.4°F | Ht 73.0 in | Wt 210.0 lb

## 2019-03-04 DIAGNOSIS — N183 Chronic kidney disease, stage 3 unspecified: Secondary | ICD-10-CM

## 2019-03-04 DIAGNOSIS — E1122 Type 2 diabetes mellitus with diabetic chronic kidney disease: Secondary | ICD-10-CM | POA: Diagnosis not present

## 2019-03-04 DIAGNOSIS — E1159 Type 2 diabetes mellitus with other circulatory complications: Secondary | ICD-10-CM | POA: Diagnosis not present

## 2019-03-04 DIAGNOSIS — I10 Essential (primary) hypertension: Secondary | ICD-10-CM

## 2019-03-04 DIAGNOSIS — E785 Hyperlipidemia, unspecified: Secondary | ICD-10-CM | POA: Diagnosis not present

## 2019-03-04 DIAGNOSIS — E1169 Type 2 diabetes mellitus with other specified complication: Secondary | ICD-10-CM | POA: Diagnosis not present

## 2019-03-04 DIAGNOSIS — N189 Chronic kidney disease, unspecified: Secondary | ICD-10-CM | POA: Insufficient documentation

## 2019-03-04 LAB — CMP14+EGFR
ALT: 15 IU/L (ref 0–44)
AST: 15 IU/L (ref 0–40)
Albumin/Globulin Ratio: 1.8 (ref 1.2–2.2)
Albumin: 4.2 g/dL (ref 3.8–4.8)
Alkaline Phosphatase: 83 IU/L (ref 39–117)
BUN/Creatinine Ratio: 24 (ref 10–24)
BUN: 40 mg/dL — ABNORMAL HIGH (ref 8–27)
Bilirubin Total: 0.5 mg/dL (ref 0.0–1.2)
CO2: 18 mmol/L — ABNORMAL LOW (ref 20–29)
Calcium: 9.4 mg/dL (ref 8.6–10.2)
Chloride: 106 mmol/L (ref 96–106)
Creatinine, Ser: 1.66 mg/dL — ABNORMAL HIGH (ref 0.76–1.27)
GFR calc Af Amer: 48 mL/min/{1.73_m2} — ABNORMAL LOW (ref >59)
GFR calc non Af Amer: 41 mL/min/{1.73_m2} — ABNORMAL LOW (ref >59)
Globulin, Total: 2.4 g/dL (ref 1.5–4.5)
Glucose: 117 mg/dL — ABNORMAL HIGH (ref 65–99)
Potassium: 4.8 mmol/L (ref 3.5–5.2)
Sodium: 140 mmol/L (ref 134–144)
Total Protein: 6.6 g/dL (ref 6.0–8.5)

## 2019-03-04 LAB — LIPID PANEL
Chol/HDL Ratio: 4 ratio (ref 0.0–5.0)
Cholesterol, Total: 140 mg/dL (ref 100–199)
HDL: 35 mg/dL — ABNORMAL LOW (ref >39)
LDL Chol Calc (NIH): 76 mg/dL (ref 0–99)
Triglycerides: 168 mg/dL — ABNORMAL HIGH (ref 0–149)
VLDL Cholesterol Cal: 29 mg/dL (ref 5–40)

## 2019-03-04 MED ORDER — AMLODIPINE BESYLATE 10 MG PO TABS: 10.0000 mg | ORAL_TABLET | Freq: Every day | ORAL | 3 refills | Status: DC

## 2019-03-04 NOTE — Progress Notes (Signed)
BP (!) 195/93   Pulse 100   Temp 98.4 F (36.9 C) (Temporal)   Ht '6\' 1"'  (1.854 m)   Wt 210 lb (95.3 kg)   SpO2 96%   BMI 27.71 kg/m    Subjective:   Patient ID: Austin Weber, male    DOB: 02/28/1950, 69 y.o.   MRN: [id number]  HPI: Austin Weber is a 69 y.o. male presenting on 03/04/2019 for Diabetes (3 month follow up)   HPI Type 2 diabetes mellitus Patient comes in today for recheck of his diabetes. Patient has been currently taking glipizide and Metformin and Prandin, A1c yesterday was 7.3 which is slightly up. Patient is currently on an ACE inhibitor/ARB. Patient has not seen an ophthalmologist this year. Patient denies any issues with their feet.  Patient is showing signs of early CKD over the past 6 months, but likely due to elevated blood pressure, will just get blood pressure down and see if that improves it.  Hypertension Patient is currently on amlodipine 5 and carvedilol and losartan, and their blood pressure today is 195/93. Patient denies any lightheadedness or dizziness. Patient denies headaches, blurred vision, chest pains, shortness of breath, or weakness. Denies any side effects from medication and is content with current medication.   Hyperlipidemia Patient is coming in for recheck of his hyperlipidemia. The patient is currently taking atorvastatin. They deny any issues with myalgias or history of liver damage from it. They deny any focal numbness or weakness or chest pain.   Relevant past medical, surgical, family and social history reviewed and updated as indicated. Interim medical history since our last visit reviewed. Allergies and medications reviewed and updated.  Review of Systems  Constitutional: Negative for chills and fever.  Eyes: Negative for visual disturbance.  Respiratory: Negative for shortness of breath and wheezing.   Cardiovascular: Negative for chest pain and leg swelling.  Musculoskeletal: Negative for back pain and gait problem.  Skin:  Negative for rash.  Neurological: Negative for dizziness, weakness and light-headedness.  All other systems reviewed and are negative.   Per HPI unless specifically indicated above   Allergies as of 03/04/2019   No Known Allergies     Medication List       Accurate as of March 04, 2019  8:27 AM. If you have any questions, ask your nurse or doctor.        amLODipine 10 MG tablet Commonly known as: NORVASC Take 1 tablet (10 mg total) by mouth daily. What changed:   medication strength  how much to take Changed by: Worthy Rancher, MD   aspirin EC 81 MG tablet Take 81 mg by mouth 2 (two) times daily.   atorvastatin 20 MG tablet Commonly known as: LIPITOR Take 1 tablet (20 mg total) by mouth daily.   carvedilol 6.25 MG tablet Commonly known as: COREG Take 1 tablet (6.25 mg total) by mouth 3 (three) times daily.   glipiZIDE 10 MG 24 hr tablet Commonly known as: GLUCOTROL XL Take 1 tablet (10 mg total) by mouth 2 (two) times daily.   losartan 100 MG tablet Commonly known as: COZAAR Take 1 tablet (100 mg total) by mouth daily.   metFORMIN 500 MG 24 hr tablet Commonly known as: GLUCOPHAGE-XR Take 2 tablets (1,000 mg total) by mouth 2 (two) times daily.   repaglinide 2 MG tablet Commonly known as: PRANDIN Take 1 tablet (2 mg total) by mouth 3 (three) times daily.   tadalafil 5 MG tablet Commonly known as:  CIALIS Take 1 tablet (5 mg total) by mouth daily.        Objective:   BP (!) 195/93   Pulse 100   Temp 98.4 F (36.9 C) (Temporal)   Ht '6\' 1"'  (1.854 m)   Wt 210 lb (95.3 kg)   SpO2 96%   BMI 27.71 kg/m   Wt Readings from Last 3 Encounters:  03/04/19 210 lb (95.3 kg)  11/27/18 209 lb 12.8 oz (95.2 kg)  08/25/18 210 lb (95.3 kg)    Physical Exam Vitals and nursing note reviewed.  Constitutional:      General: He is not in acute distress.    Appearance: He is well-developed. He is not diaphoretic.  Eyes:     General: No scleral  icterus.    Conjunctiva/sclera: Conjunctivae normal.  Neck:     Thyroid: No thyromegaly.  Cardiovascular:     Rate and Rhythm: Normal rate and regular rhythm.     Heart sounds: Normal heart sounds. No murmur.  Pulmonary:     Effort: Pulmonary effort is normal. No respiratory distress.     Breath sounds: Normal breath sounds. No wheezing.  Abdominal:     General: Abdomen is flat. Bowel sounds are normal. There is no distension.     Tenderness: There is no abdominal tenderness. There is no right CVA tenderness, left CVA tenderness, guarding or rebound.  Musculoskeletal:        General: Normal range of motion.     Cervical back: Neck supple.  Lymphadenopathy:     Cervical: No cervical adenopathy.  Skin:    General: Skin is warm and dry.     Findings: No rash.  Neurological:     Mental Status: He is alert and oriented to person, place, and time.     Coordination: Coordination normal.  Psychiatric:        Behavior: Behavior normal.     Results for orders placed or performed in visit on 03/03/19  Lipid panel  Result Value Ref Range   Cholesterol, Total 140 100 - 199 mg/dL   Triglycerides 168 (H) 0 - 149 mg/dL   HDL 35 (L) >39 mg/dL   VLDL Cholesterol Cal 29 5 - 40 mg/dL   LDL Chol Calc (NIH) 76 0 - 99 mg/dL   Chol/HDL Ratio 4.0 0.0 - 5.0 ratio  CMP14+EGFR  Result Value Ref Range   Glucose 117 (H) 65 - 99 mg/dL   BUN 40 (H) 8 - 27 mg/dL   Creatinine, Ser 1.66 (H) 0.76 - 1.27 mg/dL   GFR calc non Af Amer 41 (L) >59 mL/min/1.73   GFR calc Af Amer 48 (L) >59 mL/min/1.73   BUN/Creatinine Ratio 24 10 - 24   Sodium 140 134 - 144 mmol/L   Potassium 4.8 3.5 - 5.2 mmol/L   Chloride 106 96 - 106 mmol/L   CO2 18 (L) 20 - 29 mmol/L   Calcium 9.4 8.6 - 10.2 mg/dL   Total Protein 6.6 6.0 - 8.5 g/dL   Albumin 4.2 3.8 - 4.8 g/dL   Globulin, Total 2.4 1.5 - 4.5 g/dL   Albumin/Globulin Ratio 1.8 1.2 - 2.2   Bilirubin Total 0.5 0.0 - 1.2 mg/dL   Alkaline Phosphatase 83 39 - 117 IU/L    AST 15 0 - 40 IU/L   ALT 15 0 - 44 IU/L  hgba1c  Result Value Ref Range   HB A1C (BAYER DCA - WAIVED) 7.3 (H) <7.0 %    Assessment & Plan:  Problem List Items Addressed This Visit      Cardiovascular and Mediastinum   Hypertension associated with diabetes ([place])   Relevant Medications   amLODipine (NORVASC) 10 MG tablet   Other Relevant Orders   CMP14+EGFR     Endocrine   Type 2 diabetes mellitus with other specified complication ([place]) - Primary   Relevant Orders   Bayer DCA Hb A1c Waived   CMP14+EGFR   Hyperlipidemia associated with type 2 diabetes mellitus ([place])   Relevant Orders   Lipid panel   CKD stage 3 due to type 2 diabetes mellitus ([place])   Relevant Orders   CBC with Differential/Platelet   CMP14+EGFR      Increased amlodipine to 10 mg to see if helps blood pressure and will monitor closely kidney function.  A1c 7.3 the rest of blood work looks pretty good, no other medication changes Follow up plan: Return in about 3 months (around 06/01/2019), or if symptoms worsen or fail to improve, for Hypertension and diabetes.  Counseling provided for all of the vaccine components Orders Placed This Encounter  Procedures  . Bayer DCA Hb A1c Waived  . CBC with Differential/Platelet  . CMP14+EGFR  . Lipid panel    Caryl Pina, MD [facility] 03/04/2019, 8:27 AM

## 2019-03-08 ENCOUNTER — Ambulatory Visit: Payer: Medicare Other

## 2019-04-01 ENCOUNTER — Ambulatory Visit (INDEPENDENT_AMBULATORY_CARE_PROVIDER_SITE_OTHER): Payer: Medicare Other | Admitting: *Deleted

## 2019-04-01 DIAGNOSIS — Z0000 Encounter for general adult medical examination without abnormal findings: Secondary | ICD-10-CM | POA: Diagnosis not present

## 2019-04-01 NOTE — Progress Notes (Signed)
MEDICARE ANNUAL WELLNESS VISIT  04/01/2019  Telephone Visit Disclaimer This Medicare AWV was conducted by telephone due to national recommendations for restrictions regarding the COVID-19 Pandemic (e.g. social distancing).  I verified, using two identifiers, that I am speaking with Austin Weber or their authorized healthcare agent. I discussed the limitations, risks, security, and privacy concerns of performing an evaluation and management service by telephone and the potential availability of an in-person appointment in the future. The patient expressed understanding and agreed to proceed.   Subjective:  Austin Weber is a 69 y.o. male patient of Dettinger, Fransisca Kaufmann, MD who had a Medicare Annual Wellness Visit today via telephone. Austin Weber is Retired and lives alone. He has 1 children. He reports that he is socially active and does interact with friends/family regularly. He is not physically active and enjoys fishing.  Patient Care Team: Dettinger, Fransisca Kaufmann, MD as PCP - General (Family Medicine)  Advanced Directives 04/01/2019  Does Patient Have a Medical Advance Directive? No;Yes  Type of Advance Directive Living will  Does patient want to make changes to medical advance directive? No - Patient declined    Hospital Utilization Over the Past 12 Months: # of hospitalizations or ER visits: 0 # of surgeries: 0  Review of Systems    Patient reports that his overall health is unchanged compared to last year.  History obtained from chart review and the patient  Patient Reported Readings (BP, Pulse, CBG, Weight, etc) CBG-108  Pain Assessment Pain : No/denies pain     Current Medications & Allergies (verified) Allergies as of 04/01/2019   No Known Allergies     Medication List       Accurate as of April 01, 2019  8:36 AM. If you have any questions, ask your nurse or doctor.        amLODipine 10 MG tablet Commonly known as: NORVASC Take 1 tablet (10 mg total) by mouth  daily.   aspirin EC 81 MG tablet Take 81 mg by mouth 2 (two) times daily.   atorvastatin 20 MG tablet Commonly known as: LIPITOR Take 1 tablet (20 mg total) by mouth daily.   carvedilol 6.25 MG tablet Commonly known as: COREG Take 1 tablet (6.25 mg total) by mouth 3 (three) times daily.   glipiZIDE 10 MG 24 hr tablet Commonly known as: GLUCOTROL XL Take 1 tablet (10 mg total) by mouth 2 (two) times daily.   losartan 100 MG tablet Commonly known as: COZAAR Take 1 tablet (100 mg total) by mouth daily.   metFORMIN 500 MG 24 hr tablet Commonly known as: GLUCOPHAGE-XR Take 2 tablets (1,000 mg total) by mouth 2 (two) times daily.   repaglinide 2 MG tablet Commonly known as: PRANDIN Take 1 tablet (2 mg total) by mouth 3 (three) times daily.   tadalafil 5 MG tablet Commonly known as: CIALIS Take 1 tablet (5 mg total) by mouth daily.       History (reviewed): Past Medical History:  Diagnosis Date  . Chronic kidney disease   . Diabetes mellitus without complication ([place])   . Hyperlipidemia   . Hypertension    Past Surgical History:  Procedure Laterality Date  . SHOULDER SURGERY Left    Family History  Problem Relation Age of Onset  . Heart disease Father    Social History   Socioeconomic History  . Marital status: Married    Spouse name: Zay Yeargan  . Number of children: 1  . Years of education: Not  on file  . Highest education level: 12th grade  Occupational History  . Occupation: Retired  Tobacco Use  . Smoking status: Current Every Day Smoker    Packs/day: 0.50    Years: 40.00    Pack years: 20.00    Types: Cigarettes  . Smokeless tobacco: Never Used  . Tobacco comment: down from 2 ppd  Substance and Sexual Activity  . Alcohol use: Never  . Drug use: Never  . Sexual activity: Not Currently  Other Topics Concern  . Not on file  Social History Narrative  . Not on file   Social Determinants of Health   Financial Resource Strain:   .  Difficulty of Paying Living Expenses:   Food Insecurity:   . Worried About Charity fundraiser in the Last Year:   . Arboriculturist in the Last Year:   Transportation Needs:   . Film/video editor (Medical):   Marland Kitchen Lack of Transportation (Non-Medical):   Physical Activity:   . Days of Exercise per Week:   . Minutes of Exercise per Session:   Stress:   . Feeling of Stress :   Social Connections:   . Frequency of Communication with Friends and Family:   . Frequency of Social Gatherings with Friends and Family:   . Attends Religious Services:   . Active Member of Clubs or Organizations:   . Attends Archivist Meetings:   Marland Kitchen Marital Status:     Activities of Daily Living In your present state of health, do you have any difficulty performing the following activities: 04/01/2019  Hearing? N  Vision? N  Difficulty concentrating or making decisions? Y  Comment Every Now and then will forget things  Walking or climbing stairs? N  Dressing or bathing? N  Doing errands, shopping? N  Preparing Food and eating ? N  Using the Toilet? N  In the past six months, have you accidently leaked urine? N  Do you have problems with loss of bowel control? N  Managing your Medications? N  Managing your Finances? N  Housekeeping or managing your Housekeeping? N  Some recent data might be hidden    Patient Education/ Literacy How often do you need to have someone help you when you read instructions, pamphlets, or other written materials from your doctor or pharmacy?: 1 - Never What is the last grade level you completed in school?: 12th  Exercise Current Exercise Habits: The patient does not participate in regular exercise at present  Diet Patient reports consuming 3 meals a day and 1 snack(s) a day Patient reports that his primary diet is: Regular Patient reports that she does have regular access to food.   Depression Screen PHQ 2/9 Scores 04/01/2019 03/04/2019 11/27/2018 08/25/2018    PHQ - 2 Score 0 0 0 0     Fall Risk Fall Risk  04/01/2019 03/04/2019 11/27/2018 08/25/2018  Falls in the past year? 0 0 0 0     Objective:  Austin Weber seemed alert and oriented and he participated appropriately during our telephone visit.  Blood Pressure Weight BMI  BP Readings from Last 3 Encounters:  03/04/19 (!) 195/93  11/27/18 (!) 194/106  08/25/18 (!) 188/111   Wt Readings from Last 3 Encounters:  03/04/19 210 lb (95.3 kg)  11/27/18 209 lb 12.8 oz (95.2 kg)  08/25/18 210 lb (95.3 kg)   BMI Readings from Last 1 Encounters:  03/04/19 27.71 kg/m    *Unable to obtain current vital signs,  weight, and BMI due to telephone visit type  Hearing/Vision  . Aum did not seem to have difficulty with hearing/understanding during the telephone conversation . Reports that he has had a formal eye exam by an eye care professional within the past year . Reports that he has not had a formal hearing evaluation within the past year *Unable to fully assess hearing and vision during telephone visit type  Cognitive Function: 6CIT Screen 04/01/2019  What Year? 0 points  What month? 0 points  What time? 0 points  Count back from 20 0 points  Months in reverse 0 points  Repeat phrase 0 points  Total Score 0   (Normal:0-7, Significant for Dysfunction: >8)  Normal Cognitive Function Screening: Yes   Immunization & Health Maintenance Record Immunization History  Administered Date(s) Administered  . Fluad Quad(high Dose 65+) 11/27/2018  . Influenza, High Dose Seasonal PF 11/17/2014, 11/29/2015, 09/26/2016, 10/09/2017  . Influenza-Unspecified 12/29/2013  . Pneumococcal Conjugate-13 02/28/2016  . Pneumococcal Polysaccharide-23 03/31/2017  . Tdap 05/07/2009    Health Maintenance  Topic Date Due  . OPHTHALMOLOGY EXAM  Never done  . COLONOSCOPY  11/27/2019 (Originally 06/04/1998)  . Hepatitis C Screening  11/27/2019 (Originally 01-Jul-1950)  . TETANUS/TDAP  05/08/2019  . HEMOGLOBIN  A1C  08/31/2019  . FOOT EXAM  11/27/2019  . INFLUENZA VACCINE  Completed  . PNA vac Low Risk Adult  Completed       Assessment  This is a routine wellness examination for Austin Weber.  Health Maintenance: Due or Overdue Health Maintenance Due  Topic Date Due  . OPHTHALMOLOGY EXAM  Never done    Austin Weber does not need a referral for Community Assistance: Care Management:   no Social Work:    no Prescription Assistance:  no Nutrition/Diabetes Education:  no   Plan:  Personalized Goals Goals Addressed   None    Personalized Health Maintenance & Screening Recommendations  Diabetic Eye Exam. Pt states he has had and we will request records.   Lung Cancer Screening Recommended: yes- Discuss with Dettinger at next visit (Low Dose CT Chest recommended if Age 48-80 years, 30 pack-year currently smoking OR have quit w/in past 15 years) Hepatitis C Screening recommended: no HIV Screening recommended: no  Advanced Directives: Written information was not prepared per patient's request.  Referrals & Orders No orders of the defined types were placed in this encounter.   Follow-up Plan . Follow-up with Dettinger, Fransisca Kaufmann, MD as planned . Discuss Low Dose CT scan with Dettinger at next appointment.     I have personally reviewed and noted the following in the patient's chart:   . Medical and social history . Use of alcohol, tobacco or illicit drugs  . Current medications and supplements . Functional ability and status . Nutritional status . Physical activity . Advanced directives . List of other physicians . Hospitalizations, surgeries, and ER visits in previous 12 months . Vitals . Screenings to include cognitive, depression, and falls . Referrals and appointments  In addition, I have reviewed and discussed with Austin Weber certain preventive protocols, quality metrics, and best practice recommendations. A written personalized care plan for preventive  services as well as general preventive health recommendations is available and can be mailed to the patient at his request.      Lynnea Ferrier, LPN  1/95/[id number]

## 2019-04-23 ENCOUNTER — Other Ambulatory Visit: Payer: Self-pay | Admitting: Family Medicine

## 2019-05-31 ENCOUNTER — Other Ambulatory Visit: Payer: Self-pay

## 2019-05-31 ENCOUNTER — Other Ambulatory Visit: Payer: Medicare Other

## 2019-05-31 DIAGNOSIS — E1122 Type 2 diabetes mellitus with diabetic chronic kidney disease: Secondary | ICD-10-CM

## 2019-05-31 DIAGNOSIS — E1169 Type 2 diabetes mellitus with other specified complication: Secondary | ICD-10-CM | POA: Diagnosis not present

## 2019-05-31 DIAGNOSIS — I152 Hypertension secondary to endocrine disorders: Secondary | ICD-10-CM

## 2019-05-31 DIAGNOSIS — I10 Essential (primary) hypertension: Secondary | ICD-10-CM | POA: Diagnosis not present

## 2019-05-31 DIAGNOSIS — E1159 Type 2 diabetes mellitus with other circulatory complications: Secondary | ICD-10-CM | POA: Diagnosis not present

## 2019-05-31 DIAGNOSIS — E785 Hyperlipidemia, unspecified: Secondary | ICD-10-CM | POA: Diagnosis not present

## 2019-05-31 LAB — CMP14+EGFR
ALT: 13 IU/L (ref 0–44)
AST: 12 IU/L (ref 0–40)
Albumin/Globulin Ratio: 1.5 (ref 1.2–2.2)
Albumin: 4 g/dL (ref 3.8–4.8)
Alkaline Phosphatase: 79 IU/L (ref 48–121)
BUN/Creatinine Ratio: 20 (ref 10–24)
BUN: 30 mg/dL — ABNORMAL HIGH (ref 8–27)
Bilirubin Total: 0.2 mg/dL (ref 0.0–1.2)
CO2: 18 mmol/L — ABNORMAL LOW (ref 20–29)
Calcium: 9.4 mg/dL (ref 8.6–10.2)
Chloride: 109 mmol/L — ABNORMAL HIGH (ref 96–106)
Creatinine, Ser: 1.49 mg/dL — ABNORMAL HIGH (ref 0.76–1.27)
GFR calc Af Amer: 54 mL/min/{1.73_m2} — ABNORMAL LOW (ref >59)
GFR calc non Af Amer: 47 mL/min/{1.73_m2} — ABNORMAL LOW (ref >59)
Globulin, Total: 2.6 g/dL (ref 1.5–4.5)
Glucose: 125 mg/dL — ABNORMAL HIGH (ref 65–99)
Potassium: 4.7 mmol/L (ref 3.5–5.2)
Sodium: 140 mmol/L (ref 134–144)
Total Protein: 6.6 g/dL (ref 6.0–8.5)

## 2019-05-31 LAB — CBC WITH DIFFERENTIAL/PLATELET
Basophils Absolute: 0 10*3/uL (ref 0.0–0.2)
Basos: 1 %
EOS (ABSOLUTE): 0.2 10*3/uL (ref 0.0–0.4)
Eos: 3 %
Hematocrit: 40 % (ref 37.5–51.0)
Hemoglobin: 13.5 g/dL (ref 13.0–17.7)
Immature Grans (Abs): 0 10*3/uL (ref 0.0–0.1)
Immature Granulocytes: 0 %
Lymphocytes Absolute: 2 10*3/uL (ref 0.7–3.1)
Lymphs: 24 %
MCH: 31.5 pg (ref 26.6–33.0)
MCHC: 33.8 g/dL (ref 31.5–35.7)
MCV: 94 fL (ref 79–97)
Monocytes Absolute: 0.7 10*3/uL (ref 0.1–0.9)
Monocytes: 9 %
Neutrophils Absolute: 5.3 10*3/uL (ref 1.4–7.0)
Neutrophils: 63 %
Platelets: 189 10*3/uL (ref 150–450)
RBC: 4.28 x10E6/uL (ref 4.14–5.80)
RDW: 11.6 % (ref 11.6–15.4)
WBC: 8.4 10*3/uL (ref 3.4–10.8)

## 2019-05-31 LAB — LIPID PANEL
Chol/HDL Ratio: 3.5 ratio (ref 0.0–5.0)
Cholesterol, Total: 120 mg/dL (ref 100–199)
HDL: 34 mg/dL — ABNORMAL LOW (ref >39)
LDL Chol Calc (NIH): 58 mg/dL (ref 0–99)
Triglycerides: 161 mg/dL — ABNORMAL HIGH (ref 0–149)
VLDL Cholesterol Cal: 28 mg/dL (ref 5–40)

## 2019-05-31 LAB — BAYER DCA HB A1C WAIVED: HB A1C (BAYER DCA - WAIVED): 7.4 % — ABNORMAL HIGH (ref <7.0)

## 2019-06-02 ENCOUNTER — Encounter: Payer: Self-pay | Admitting: Family Medicine

## 2019-06-02 ENCOUNTER — Other Ambulatory Visit: Payer: Self-pay

## 2019-06-02 ENCOUNTER — Ambulatory Visit (INDEPENDENT_AMBULATORY_CARE_PROVIDER_SITE_OTHER): Payer: Medicare Other | Admitting: Family Medicine

## 2019-06-02 VITALS — BP 156/75 | HR 90 | Temp 98.0°F | Ht 73.0 in | Wt 202.4 lb

## 2019-06-02 DIAGNOSIS — I10 Essential (primary) hypertension: Secondary | ICD-10-CM | POA: Diagnosis not present

## 2019-06-02 DIAGNOSIS — E785 Hyperlipidemia, unspecified: Secondary | ICD-10-CM | POA: Diagnosis not present

## 2019-06-02 DIAGNOSIS — E1159 Type 2 diabetes mellitus with other circulatory complications: Secondary | ICD-10-CM

## 2019-06-02 DIAGNOSIS — E1122 Type 2 diabetes mellitus with diabetic chronic kidney disease: Secondary | ICD-10-CM

## 2019-06-02 DIAGNOSIS — E1169 Type 2 diabetes mellitus with other specified complication: Secondary | ICD-10-CM | POA: Diagnosis not present

## 2019-06-02 DIAGNOSIS — N183 Chronic kidney disease, stage 3 unspecified: Secondary | ICD-10-CM

## 2019-06-02 NOTE — Progress Notes (Signed)
BP (!) 156/75   Pulse 90   Temp 98 F (36.7 C) (Temporal)   Ht '6\' 1"'  (1.854 m)   Wt 202 lb 6 oz (91.8 kg)   BMI 26.70 kg/m    Subjective:   Patient ID: Austin Weber, male    DOB: 12/17/1950, 69 y.o.   MRN: [id number]  HPI: Austin Weber is a 69 y.o. male presenting on 06/02/2019 for Medical Management of Chronic Issues   HPI Type 2 diabetes mellitus Patient comes in today for recheck of his diabetes. Patient has been currently taking Prandin and Metformin and glipizide. Patient is currently on an ACE inhibitor/ARB. Patient has not seen an ophthalmologist this year. Patient denies any issues with their feet. The symptom started onset as an adult hypertension and hyperlipidemia and CKD stage III, CKD is stable. ARE RELATED TO DM   Hypertension Patient is currently on amlodipine and carvedilol and losartan, and their blood pressure today is 156/75. Patient denies any lightheadedness or dizziness. Patient denies headaches, blurred vision, chest pains, shortness of breath, or weakness. Denies any side effects from medication and is content with current medication.   Hyperlipidemia Patient is coming in for recheck of his hyperlipidemia. The patient is currently taking Lipitor. They deny any issues with myalgias or history of liver damage from it. They deny any focal numbness or weakness or chest pain.   Relevant past medical, surgical, family and social history reviewed and updated as indicated. Interim medical history since our last visit reviewed. Allergies and medications reviewed and updated.  Review of Systems  Constitutional: Negative for chills and fever.  Respiratory: Negative for shortness of breath and wheezing.   Cardiovascular: Negative for chest pain and leg swelling.  Musculoskeletal: Negative for back pain and gait problem.  Skin: Negative for rash.  Neurological: Negative for dizziness, weakness and light-headedness.  All other systems reviewed and are  negative.   Per HPI unless specifically indicated above   Allergies as of 06/02/2019   No Known Allergies     Medication List       Accurate as of Jun 02, 2019  8:24 AM. If you have any questions, ask your nurse or doctor.        amLODipine 10 MG tablet Commonly known as: NORVASC Take 1 tablet (10 mg total) by mouth daily.   aspirin EC 81 MG tablet Take 81 mg by mouth 2 (two) times daily.   atorvastatin 20 MG tablet Commonly known as: LIPITOR Take 1 tablet (20 mg total) by mouth daily.   carvedilol 6.25 MG tablet Commonly known as: COREG Take 1 tablet (6.25 mg total) by mouth 3 (three) times daily.   glipiZIDE 10 MG 24 hr tablet Commonly known as: GLUCOTROL XL Take 1 tablet (10 mg total) by mouth 2 (two) times daily.   losartan 100 MG tablet Commonly known as: COZAAR Take 1 tablet (100 mg total) by mouth daily.   metFORMIN 500 MG 24 hr tablet Commonly known as: GLUCOPHAGE-XR Take 2 tablets (1,000 mg total) by mouth 2 (two) times daily.   repaglinide 2 MG tablet Commonly known as: PRANDIN Take 1 tablet (2 mg total) by mouth 3 (three) times daily.   tadalafil 5 MG tablet Commonly known as: CIALIS TAKE ONE (1) TABLET EACH DAY        Objective:   BP (!) 156/75   Pulse 90   Temp 98 F (36.7 C) (Temporal)   Ht '6\' 1"'  (1.854 m)   Wt 202 lb  6 oz (91.8 kg)   BMI 26.70 kg/m   Wt Readings from Last 3 Encounters:  06/02/19 202 lb 6 oz (91.8 kg)  03/04/19 210 lb (95.3 kg)  11/27/18 209 lb 12.8 oz (95.2 kg)    Physical Exam Vitals and nursing note reviewed.  Constitutional:      General: He is not in acute distress.    Appearance: He is well-developed. He is not diaphoretic.  Eyes:     General: No scleral icterus.    Conjunctiva/sclera: Conjunctivae normal.  Neck:     Thyroid: No thyromegaly.  Cardiovascular:     Rate and Rhythm: Normal rate and regular rhythm.     Heart sounds: Normal heart sounds. No murmur.  Pulmonary:     Effort: Pulmonary  effort is normal. No respiratory distress.     Breath sounds: Normal breath sounds. No wheezing.  Musculoskeletal:        General: Normal range of motion.     Cervical back: Neck supple.  Lymphadenopathy:     Cervical: No cervical adenopathy.  Skin:    General: Skin is warm and dry.     Findings: No rash.  Neurological:     Mental Status: He is alert and oriented to person, place, and time.     Coordination: Coordination normal.  Psychiatric:        Behavior: Behavior normal.     Results for orders placed or performed in visit on 05/31/19  Lipid panel  Result Value Ref Range   Cholesterol, Total 120 100 - 199 mg/dL   Triglycerides 161 (H) 0 - 149 mg/dL   HDL 34 (L) >39 mg/dL   VLDL Cholesterol Cal 28 5 - 40 mg/dL   LDL Chol Calc (NIH) 58 0 - 99 mg/dL   Chol/HDL Ratio 3.5 0.0 - 5.0 ratio  CMP14+EGFR  Result Value Ref Range   Glucose 125 (H) 65 - 99 mg/dL   BUN 30 (H) 8 - 27 mg/dL   Creatinine, Ser 1.49 (H) 0.76 - 1.27 mg/dL   GFR calc non Af Amer 47 (L) >59 mL/min/1.73   GFR calc Af Amer 54 (L) >59 mL/min/1.73   BUN/Creatinine Ratio 20 10 - 24   Sodium 140 134 - 144 mmol/L   Potassium 4.7 3.5 - 5.2 mmol/L   Chloride 109 (H) 96 - 106 mmol/L   CO2 18 (L) 20 - 29 mmol/L   Calcium 9.4 8.6 - 10.2 mg/dL   Total Protein 6.6 6.0 - 8.5 g/dL   Albumin 4.0 3.8 - 4.8 g/dL   Globulin, Total 2.6 1.5 - 4.5 g/dL   Albumin/Globulin Ratio 1.5 1.2 - 2.2   Bilirubin Total 0.2 0.0 - 1.2 mg/dL   Alkaline Phosphatase 79 48 - 121 IU/L   AST 12 0 - 40 IU/L   ALT 13 0 - 44 IU/L  CBC with Differential/Platelet  Result Value Ref Range   WBC 8.4 3.4 - 10.8 x10E3/uL   RBC 4.28 4.14 - 5.80 x10E6/uL   Hemoglobin 13.5 13.0 - 17.7 g/dL   Hematocrit 40.0 37.5 - 51.0 %   MCV 94 79 - 97 fL   MCH 31.5 26.6 - 33.0 pg   MCHC 33.8 31.5 - 35.7 g/dL   RDW 11.6 11.6 - 15.4 %   Platelets 189 150 - 450 x10E3/uL   Neutrophils 63 Not Estab. %   Lymphs 24 Not Estab. %   Monocytes 9 Not Estab. %   Eos 3  Not Estab. %  Basos 1 Not Estab. %   Neutrophils Absolute 5.3 1.4 - 7.0 x10E3/uL   Lymphocytes Absolute 2.0 0.7 - 3.1 x10E3/uL   Monocytes Absolute 0.7 0.1 - 0.9 x10E3/uL   EOS (ABSOLUTE) 0.2 0.0 - 0.4 x10E3/uL   Basophils Absolute 0.0 0.0 - 0.2 x10E3/uL   Immature Granulocytes 0 Not Estab. %   Immature Grans (Abs) 0.0 0.0 - 0.1 x10E3/uL  Bayer DCA Hb A1c Waived  Result Value Ref Range   HB A1C (BAYER DCA - WAIVED) 7.4 (H) <7.0 %    Assessment & Plan:   Problem List Items Addressed This Visit      Cardiovascular and Mediastinum   Hypertension associated with diabetes ([place])     Endocrine   Type 2 diabetes mellitus with other specified complication ([place]) - Primary   Relevant Orders   Bayer DCA Hb A1c Waived   Hyperlipidemia associated with type 2 diabetes mellitus ([place])   CKD stage 3 due to type 2 diabetes mellitus ([place])   Relevant Orders   CMP14+EGFR      Allowing permissive hypertension, slightly elevated, he does not want to do any new medicines, will monitor for now.  A1c is slightly elevated as well and we discussed that, he will focus on avoiding Posta as it sounds like his biggest culprit at this point. Follow up plan: Return in about 3 months (around 09/02/2019), or if symptoms worsen or fail to improve, for Diabetes and hypertension.  Counseling provided for all of the vaccine components Orders Placed This Encounter  Procedures  . Bayer DCA Hb A1c Waived  . [place] Rupinder Livingston, MD [facility] 06/02/2019, 8:24 AM

## 2019-08-20 ENCOUNTER — Other Ambulatory Visit: Payer: Self-pay | Admitting: Family Medicine

## 2019-08-20 NOTE — Telephone Encounter (Signed)
Upcoming appointment 09/06/2019 Last office visit 06/02/2019 Last refill 04/23/2019, #30, 3 refills

## 2019-08-27 ENCOUNTER — Other Ambulatory Visit: Payer: Self-pay | Admitting: Family Medicine

## 2019-09-03 ENCOUNTER — Other Ambulatory Visit: Payer: Self-pay

## 2019-09-03 ENCOUNTER — Other Ambulatory Visit: Payer: Medicare Other

## 2019-09-03 DIAGNOSIS — E1122 Type 2 diabetes mellitus with diabetic chronic kidney disease: Secondary | ICD-10-CM | POA: Diagnosis not present

## 2019-09-03 DIAGNOSIS — N183 Chronic kidney disease, stage 3 unspecified: Secondary | ICD-10-CM | POA: Diagnosis not present

## 2019-09-03 DIAGNOSIS — E1169 Type 2 diabetes mellitus with other specified complication: Secondary | ICD-10-CM | POA: Diagnosis not present

## 2019-09-03 LAB — BAYER DCA HB A1C WAIVED: HB A1C (BAYER DCA - WAIVED): 6.7 % (ref <7.0)

## 2019-09-04 LAB — CMP14+EGFR
ALT: 15 IU/L (ref 0–44)
AST: 14 IU/L (ref 0–40)
Albumin/Globulin Ratio: 1.8 (ref 1.2–2.2)
Albumin: 4.2 g/dL (ref 3.7–4.7)
Alkaline Phosphatase: 73 IU/L (ref 48–121)
BUN/Creatinine Ratio: 24 (ref 10–24)
BUN: 37 mg/dL — ABNORMAL HIGH (ref 8–27)
Bilirubin Total: 0.3 mg/dL (ref 0.0–1.2)
CO2: 19 mmol/L — ABNORMAL LOW (ref 20–29)
Calcium: 9.7 mg/dL (ref 8.6–10.2)
Chloride: 108 mmol/L — ABNORMAL HIGH (ref 96–106)
Creatinine, Ser: 1.53 mg/dL — ABNORMAL HIGH (ref 0.76–1.27)
GFR calc Af Amer: 52 mL/min/{1.73_m2} — ABNORMAL LOW (ref >59)
GFR calc non Af Amer: 45 mL/min/{1.73_m2} — ABNORMAL LOW (ref >59)
Globulin, Total: 2.4 g/dL (ref 1.5–4.5)
Glucose: 132 mg/dL — ABNORMAL HIGH (ref 65–99)
Potassium: 5.3 mmol/L — ABNORMAL HIGH (ref 3.5–5.2)
Sodium: 141 mmol/L (ref 134–144)
Total Protein: 6.6 g/dL (ref 6.0–8.5)

## 2019-09-06 ENCOUNTER — Other Ambulatory Visit: Payer: Self-pay

## 2019-09-06 ENCOUNTER — Ambulatory Visit (INDEPENDENT_AMBULATORY_CARE_PROVIDER_SITE_OTHER): Payer: Medicare Other | Admitting: Family Medicine

## 2019-09-06 ENCOUNTER — Encounter: Payer: Self-pay | Admitting: Family Medicine

## 2019-09-06 VITALS — BP 170/84 | HR 90 | Temp 98.6°F | Ht 73.0 in | Wt 204.4 lb

## 2019-09-06 DIAGNOSIS — E1122 Type 2 diabetes mellitus with diabetic chronic kidney disease: Secondary | ICD-10-CM

## 2019-09-06 DIAGNOSIS — I10 Essential (primary) hypertension: Secondary | ICD-10-CM

## 2019-09-06 DIAGNOSIS — E1169 Type 2 diabetes mellitus with other specified complication: Secondary | ICD-10-CM | POA: Diagnosis not present

## 2019-09-06 DIAGNOSIS — E785 Hyperlipidemia, unspecified: Secondary | ICD-10-CM

## 2019-09-06 DIAGNOSIS — E1159 Type 2 diabetes mellitus with other circulatory complications: Secondary | ICD-10-CM | POA: Diagnosis not present

## 2019-09-06 DIAGNOSIS — N183 Chronic kidney disease, stage 3 unspecified: Secondary | ICD-10-CM

## 2019-09-06 MED ORDER — ATORVASTATIN CALCIUM 20 MG PO TABS: 20.0000 mg | ORAL_TABLET | Freq: Every day | ORAL | 3 refills | Status: DC

## 2019-09-06 MED ORDER — METFORMIN HCL ER 500 MG PO TB24: 1000.0000 mg | ORAL_TABLET | Freq: Two times a day (BID) | ORAL | 3 refills | Status: DC

## 2019-09-06 MED ORDER — GLIPIZIDE ER 10 MG PO TB24: 10.0000 mg | ORAL_TABLET | Freq: Two times a day (BID) | ORAL | 3 refills | Status: DC

## 2019-09-06 MED ORDER — REPAGLINIDE 2 MG PO TABS: 2.0000 mg | ORAL_TABLET | Freq: Three times a day (TID) | ORAL | 3 refills | Status: DC

## 2019-09-06 MED ORDER — LOSARTAN POTASSIUM 100 MG PO TABS: 100.0000 mg | ORAL_TABLET | Freq: Every day | ORAL | 11 refills | Status: DC

## 2019-09-06 NOTE — Progress Notes (Signed)
BP (!) 170/84   Pulse 90   Temp 98.6 F (37 C)   Ht '6\' 1"'  (1.854 m)   Wt 204 lb 6 oz (92.7 kg)   SpO2 97%   BMI 26.96 kg/m    Subjective:   Patient ID: Austin Weber, male    DOB: [birth year], 69 y.o.   MRN: [id number]  HPI: Austin Weber is a 69 y.o. male presenting on 09/06/2019 for Medical Management of Chronic Issues, Diabetes, and Hypertension   HPI Type 2 diabetes mellitus Patient comes in today for recheck of his diabetes. Patient has been currently taking Metformin and glipizide and Prandin. Patient is currently on an ACE inhibitor/ARB. Patient has not seen an ophthalmologist this year. Patient denies any issues with their feet. The symptom started onset as an adult hypertension and hyperlipidemia ARE RELATED TO DM   Hypertension Patient is currently on losartan and amlodipine, and their blood pressure today is 185/93 and 174/80. Patient denies any lightheadedness or dizziness. Patient denies headaches, blurred vision, chest pains, shortness of breath, or weakness. Denies any side effects from medication and is content with current medication.   Hyperlipidemia Patient is coming in for recheck of his hyperlipidemia. The patient is currently taking atorvastatin. They deny any issues with myalgias or history of liver damage from it. They deny any focal numbness or weakness or chest pain.   Relevant past medical, surgical, family and social history reviewed and updated as indicated. Interim medical history since our last visit reviewed. Allergies and medications reviewed and updated.  Review of Systems  Constitutional: Negative for chills and fever.  Eyes: Negative for visual disturbance.  Respiratory: Negative for shortness of breath and wheezing.   Cardiovascular: Negative for chest pain and leg swelling.  Musculoskeletal: Negative for back pain and gait problem.  Skin: Negative for rash.  Neurological: Negative for dizziness, weakness and numbness.  All other systems  reviewed and are negative.   Per HPI unless specifically indicated above   Allergies as of 09/06/2019   No Known Allergies     Medication List       Accurate as of September 06, 2019  8:30 AM. If you have any questions, ask your nurse or doctor.        amLODipine 10 MG tablet Commonly known as: NORVASC Take 1 tablet (10 mg total) by mouth daily.   aspirin EC 81 MG tablet Take 81 mg by mouth 2 (two) times daily.   atorvastatin 20 MG tablet Commonly known as: LIPITOR Take 1 tablet (20 mg total) by mouth daily.   carvedilol 6.25 MG tablet Commonly known as: COREG TAKE ONE (1) TABLET THREE (3) TIMES EACH DAY   glipiZIDE 10 MG 24 hr tablet Commonly known as: GLUCOTROL XL Take 1 tablet (10 mg total) by mouth 2 (two) times daily.   losartan 100 MG tablet Commonly known as: COZAAR Take 1 tablet (100 mg total) by mouth daily.   metFORMIN 500 MG 24 hr tablet Commonly known as: GLUCOPHAGE-XR Take 2 tablets (1,000 mg total) by mouth 2 (two) times daily.   repaglinide 2 MG tablet Commonly known as: PRANDIN Take 1 tablet (2 mg total) by mouth 3 (three) times daily before meals. What changed: See the new instructions. Changed by: Fransisca Kaufmann Azaiah Mello, MD   tadalafil 5 MG tablet Commonly known as: CIALIS TAKE ONE (1) TABLET EACH DAY        Objective:   BP (!) 170/84   Pulse 90   Temp 98.6  F (37 C)   Ht '6\' 1"'  (1.854 m)   Wt 204 lb 6 oz (92.7 kg)   SpO2 97%   BMI 26.96 kg/m   Wt Readings from Last 3 Encounters:  09/06/19 204 lb 6 oz (92.7 kg)  06/02/19 202 lb 6 oz (91.8 kg)  03/04/19 210 lb (95.3 kg)    Physical Exam Vitals and nursing note reviewed.  Constitutional:      General: He is not in acute distress.    Appearance: He is well-developed. He is not diaphoretic.  Eyes:     General: No scleral icterus.    Conjunctiva/sclera: Conjunctivae normal.  Neck:     Thyroid: No thyromegaly.  Cardiovascular:     Rate and Rhythm: Normal rate and regular rhythm.       Heart sounds: Normal heart sounds. No murmur heard.   Pulmonary:     Effort: Pulmonary effort is normal. No respiratory distress.     Breath sounds: Normal breath sounds. No wheezing.  Musculoskeletal:        General: Normal range of motion.     Cervical back: Neck supple.  Lymphadenopathy:     Cervical: No cervical adenopathy.  Skin:    General: Skin is warm and dry.     Findings: No rash.  Neurological:     Mental Status: He is alert and oriented to person, place, and time.     Coordination: Coordination normal.  Psychiatric:        Behavior: Behavior normal.     Results for orders placed or performed in visit on 09/03/19  CMP14+EGFR  Result Value Ref Range   Glucose 132 (H) 65 - 99 mg/dL   BUN 37 (H) 8 - 27 mg/dL   Creatinine, Ser 1.53 (H) 0.76 - 1.27 mg/dL   GFR calc non Af Amer 45 (L) >59 mL/min/1.73   GFR calc Af Amer 52 (L) >59 mL/min/1.73   BUN/Creatinine Ratio 24 10 - 24   Sodium 141 134 - 144 mmol/L   Potassium 5.3 (H) 3.5 - 5.2 mmol/L   Chloride 108 (H) 96 - 106 mmol/L   CO2 19 (L) 20 - 29 mmol/L   Calcium 9.7 8.6 - 10.2 mg/dL   Total Protein 6.6 6.0 - 8.5 g/dL   Albumin 4.2 3.7 - 4.7 g/dL   Globulin, Total 2.4 1.5 - 4.5 g/dL   Albumin/Globulin Ratio 1.8 1.2 - 2.2   Bilirubin Total 0.3 0.0 - 1.2 mg/dL   Alkaline Phosphatase 73 48 - 121 IU/L   AST 14 0 - 40 IU/L   ALT 15 0 - 44 IU/L  Bayer DCA Hb A1c Waived  Result Value Ref Range   HB A1C (BAYER DCA - WAIVED) 6.7 <7.0 %    Assessment & Plan:   Problem List Items Addressed This Visit      Cardiovascular and Mediastinum   Hypertension associated with diabetes ([facility])   Relevant Medications   losartan (COZAAR) 100 MG tablet   glipiZIDE (GLUCOTROL XL) 10 MG 24 hr tablet   atorvastatin (LIPITOR) 20 MG tablet   metFORMIN (GLUCOPHAGE-XR) 500 MG 24 hr tablet   repaglinide (PRANDIN) 2 MG tablet     Endocrine   Type 2 diabetes mellitus with other specified complication ([facility]) - Primary   Relevant  Medications   losartan (COZAAR) 100 MG tablet   glipiZIDE (GLUCOTROL XL) 10 MG 24 hr tablet   atorvastatin (LIPITOR) 20 MG tablet   metFORMIN (GLUCOPHAGE-XR) 500 MG 24 hr tablet  repaglinide (PRANDIN) 2 MG tablet   Other Relevant Orders   CBC with Differential/Platelet   Bayer DCA Hb A1c Waived   Hyperlipidemia associated with type 2 diabetes mellitus ([facility])   Relevant Medications   losartan (COZAAR) 100 MG tablet   glipiZIDE (GLUCOTROL XL) 10 MG 24 hr tablet   atorvastatin (LIPITOR) 20 MG tablet   metFORMIN (GLUCOPHAGE-XR) 500 MG 24 hr tablet   repaglinide (PRANDIN) 2 MG tablet   Other Relevant Orders   Lipid panel   CKD stage 3 due to type 2 diabetes mellitus ([facility])   Relevant Medications   losartan (COZAAR) 100 MG tablet   glipiZIDE (GLUCOTROL XL) 10 MG 24 hr tablet   atorvastatin (LIPITOR) 20 MG tablet   metFORMIN (GLUCOPHAGE-XR) 500 MG 24 hr tablet   repaglinide (PRANDIN) 2 MG tablet   Other Relevant Orders   CBC with Differential/Platelet   CMP14+EGFR      Patient has elevated blood pressure, he just took his medicine right before, we will see where it is, come back in a 1 to 2-week recheck with a nurse visit to check his blood pressure. Follow up plan: Return if symptoms worsen or fail to improve, for 3 to 54-monthdiabetes recheck.  Counseling provided for all of the vaccine components Orders Placed This Encounter  Procedures  . CBC with Differential/Platelet  . CMP14+EGFR  . Lipid panel  . [facility] [place] [facility] 09/06/2019, 8:30 AM

## 2019-09-14 ENCOUNTER — Ambulatory Visit (INDEPENDENT_AMBULATORY_CARE_PROVIDER_SITE_OTHER): Payer: Medicare Other | Admitting: *Deleted

## 2019-09-14 ENCOUNTER — Other Ambulatory Visit: Payer: Self-pay

## 2019-09-14 VITALS — BP 170/82

## 2019-09-14 DIAGNOSIS — I152 Hypertension secondary to endocrine disorders: Secondary | ICD-10-CM

## 2019-09-14 DIAGNOSIS — I10 Essential (primary) hypertension: Secondary | ICD-10-CM

## 2019-09-14 DIAGNOSIS — E1159 Type 2 diabetes mellitus with other circulatory complications: Secondary | ICD-10-CM

## 2019-09-14 NOTE — Progress Notes (Signed)
Patient in today for blood pressure check. BP 170/82. Unchanged since last visit. Advised patient will call if provider wishes to make any changes. Also scheduled patient for 1st Covid vaccine tomorrow.

## 2019-09-15 ENCOUNTER — Ambulatory Visit: Payer: Medicare Other

## 2019-09-15 DIAGNOSIS — Z23 Encounter for immunization: Secondary | ICD-10-CM

## 2019-09-15 NOTE — Progress Notes (Signed)
   Covid-19 Vaccination Clinic  Name:  Deontay Ladnier    MRN: [id number] DOB: [birth year]  09/15/2019  Mr. Reindl was observed post Covid-19 immunization for 15 minutes without incident. He was provided with Vaccine Information Sheet and instruction to access the V-Safe system.   Mr. Schiff was instructed to call 911 with any severe reactions post vaccine: Marland Kitchen Difficulty breathing  . Swelling of face and throat  . A fast heartbeat  . A bad rash all over body  . Dizziness and weakness   Immunizations Administered    Name Date Dose VIS Date Route   Moderna COVID-19 Vaccine 09/15/2019  8:54 AM 0.5 mL 12/2018 Intramuscular   Manufacturer: Moderna   Lot: [id number]   Al[place]: [phone]

## 2019-10-13 ENCOUNTER — Ambulatory Visit (INDEPENDENT_AMBULATORY_CARE_PROVIDER_SITE_OTHER): Payer: Medicare Other

## 2019-10-13 ENCOUNTER — Other Ambulatory Visit: Payer: Self-pay

## 2019-10-13 DIAGNOSIS — Z23 Encounter for immunization: Secondary | ICD-10-CM | POA: Diagnosis not present

## 2019-10-13 NOTE — Progress Notes (Signed)
° °  Covid-19 Vaccination Clinic  Name:  Orvil Faraone    MRN: [id number] DOB: [birth year]  10/13/2019  Mr. Monten[place]ro was observed post Covid-19 immunization for 15 minutes without incident. He was provided with Vaccine Information Sheet and instruction to access the V-Safe system.   Mr. Heacock was instructed to call 911 with any severe reactions post vaccine:  Difficulty breathing   Swelling of face and throat   A fast heartbeat   A bad rash all over body   Dizziness and weakness   Immunizations Administered    Name Date Dose VIS Date Route   Moderna COVID-19 Vaccine 10/13/2019  8:25 AM 0.5 mL 12/2018 Intramuscular   Manufacturer: Moderna   Lot: [id number]   Nellie: [id number]

## 2019-12-07 ENCOUNTER — Other Ambulatory Visit: Payer: Medicare Other

## 2019-12-07 ENCOUNTER — Other Ambulatory Visit: Payer: Self-pay

## 2019-12-07 DIAGNOSIS — N183 Chronic kidney disease, stage 3 unspecified: Secondary | ICD-10-CM

## 2019-12-07 DIAGNOSIS — E785 Hyperlipidemia, unspecified: Secondary | ICD-10-CM | POA: Diagnosis not present

## 2019-12-07 DIAGNOSIS — E1169 Type 2 diabetes mellitus with other specified complication: Secondary | ICD-10-CM | POA: Diagnosis not present

## 2019-12-07 DIAGNOSIS — E1122 Type 2 diabetes mellitus with diabetic chronic kidney disease: Secondary | ICD-10-CM | POA: Diagnosis not present

## 2019-12-07 LAB — BAYER DCA HB A1C WAIVED: HB A1C (BAYER DCA - WAIVED): 7.9 % — ABNORMAL HIGH (ref <7.0)

## 2019-12-08 ENCOUNTER — Ambulatory Visit (INDEPENDENT_AMBULATORY_CARE_PROVIDER_SITE_OTHER): Payer: Medicare Other | Admitting: Family Medicine

## 2019-12-08 ENCOUNTER — Encounter: Payer: Self-pay | Admitting: Family Medicine

## 2019-12-08 VITALS — BP 169/82 | HR 102 | Temp 97.5°F | Ht 73.0 in | Wt 203.0 lb

## 2019-12-08 DIAGNOSIS — E785 Hyperlipidemia, unspecified: Secondary | ICD-10-CM

## 2019-12-08 DIAGNOSIS — I152 Hypertension secondary to endocrine disorders: Secondary | ICD-10-CM

## 2019-12-08 DIAGNOSIS — E1159 Type 2 diabetes mellitus with other circulatory complications: Secondary | ICD-10-CM

## 2019-12-08 DIAGNOSIS — N183 Chronic kidney disease, stage 3 unspecified: Secondary | ICD-10-CM

## 2019-12-08 DIAGNOSIS — Z23 Encounter for immunization: Secondary | ICD-10-CM

## 2019-12-08 DIAGNOSIS — E1169 Type 2 diabetes mellitus with other specified complication: Secondary | ICD-10-CM | POA: Diagnosis not present

## 2019-12-08 DIAGNOSIS — E1122 Type 2 diabetes mellitus with diabetic chronic kidney disease: Secondary | ICD-10-CM | POA: Diagnosis not present

## 2019-12-08 LAB — CBC WITH DIFFERENTIAL/PLATELET
Basophils Absolute: 0.1 10*3/uL (ref 0.0–0.2)
Basos: 1 %
EOS (ABSOLUTE): 0.2 10*3/uL (ref 0.0–0.4)
Eos: 3 %
Hematocrit: 41.1 % (ref 37.5–51.0)
Hemoglobin: 14.2 g/dL (ref 13.0–17.7)
Immature Grans (Abs): 0 10*3/uL (ref 0.0–0.1)
Immature Granulocytes: 0 %
Lymphocytes Absolute: 1.9 10*3/uL (ref 0.7–3.1)
Lymphs: 23 %
MCH: 32.1 pg (ref 26.6–33.0)
MCHC: 34.5 g/dL (ref 31.5–35.7)
MCV: 93 fL (ref 79–97)
Monocytes Absolute: 0.7 10*3/uL (ref 0.1–0.9)
Monocytes: 8 %
Neutrophils Absolute: 5.2 10*3/uL (ref 1.4–7.0)
Neutrophils: 65 %
Platelets: 208 10*3/uL (ref 150–450)
RBC: 4.43 x10E6/uL (ref 4.14–5.80)
RDW: 11.7 % (ref 11.6–15.4)
WBC: 8.1 10*3/uL (ref 3.4–10.8)

## 2019-12-08 LAB — LIPID PANEL
Chol/HDL Ratio: 3.6 ratio (ref 0.0–5.0)
Cholesterol, Total: 111 mg/dL (ref 100–199)
HDL: 31 mg/dL — ABNORMAL LOW (ref >39)
LDL Chol Calc (NIH): 54 mg/dL (ref 0–99)
Triglycerides: 147 mg/dL (ref 0–149)
VLDL Cholesterol Cal: 26 mg/dL (ref 5–40)

## 2019-12-08 LAB — CMP14+EGFR
ALT: 12 IU/L (ref 0–44)
AST: 13 IU/L (ref 0–40)
Albumin/Globulin Ratio: 1.6 (ref 1.2–2.2)
Albumin: 4.1 g/dL (ref 3.7–4.7)
Alkaline Phosphatase: 81 IU/L (ref 44–121)
BUN/Creatinine Ratio: 14 (ref 10–24)
BUN: 27 mg/dL (ref 8–27)
Bilirubin Total: 0.4 mg/dL (ref 0.0–1.2)
CO2: 21 mmol/L (ref 20–29)
Calcium: 9.3 mg/dL (ref 8.6–10.2)
Chloride: 105 mmol/L (ref 96–106)
Creatinine, Ser: 1.93 mg/dL — ABNORMAL HIGH (ref 0.76–1.27)
GFR calc Af Amer: 39 mL/min/{1.73_m2} — ABNORMAL LOW (ref >59)
GFR calc non Af Amer: 34 mL/min/{1.73_m2} — ABNORMAL LOW (ref >59)
Globulin, Total: 2.5 g/dL (ref 1.5–4.5)
Glucose: 161 mg/dL — ABNORMAL HIGH (ref 65–99)
Potassium: 5.1 mmol/L (ref 3.5–5.2)
Sodium: 139 mmol/L (ref 134–144)
Total Protein: 6.6 g/dL (ref 6.0–8.5)

## 2019-12-08 MED ORDER — GLIPIZIDE ER 10 MG PO TB24: 10.0000 mg | ORAL_TABLET | Freq: Two times a day (BID) | ORAL | 3 refills | Status: DC

## 2019-12-08 MED ORDER — CARVEDILOL 6.25 MG PO TABS
ORAL_TABLET | ORAL | 3 refills | Status: DC
Start: 2019-12-08 — End: 2021-02-09

## 2019-12-08 MED ORDER — METFORMIN HCL ER 500 MG PO TB24: 1000.0000 mg | ORAL_TABLET | Freq: Two times a day (BID) | ORAL | 3 refills | Status: DC

## 2019-12-08 MED ORDER — AMLODIPINE BESYLATE 10 MG PO TABS: 10.0000 mg | ORAL_TABLET | Freq: Every day | ORAL | 3 refills | Status: DC

## 2019-12-08 MED ORDER — TADALAFIL 5 MG PO TABS
ORAL_TABLET | ORAL | 3 refills | Status: DC
Start: 2019-12-08 — End: 2020-04-17

## 2019-12-08 MED ORDER — ATORVASTATIN CALCIUM 20 MG PO TABS: 20.0000 mg | ORAL_TABLET | Freq: Every day | ORAL | 3 refills | Status: DC

## 2019-12-08 MED ORDER — LOSARTAN POTASSIUM 100 MG PO TABS: 100.0000 mg | ORAL_TABLET | Freq: Every day | ORAL | 3 refills | Status: DC

## 2019-12-08 MED ORDER — REPAGLINIDE 2 MG PO TABS: 2.0000 mg | ORAL_TABLET | Freq: Three times a day (TID) | ORAL | 3 refills | Status: DC

## 2019-12-08 NOTE — Progress Notes (Signed)
BP (!) 169/82   Pulse (!) 102   Temp (!) 97.5 F (36.4 C)   Ht _0  (1.854 m)   Wt 203 lb (92.1 kg)   SpO2 99%   BMI 26.78 kg/m    Subjective:   Patient ID: Austin Weber, male    DOB: April 15, 1950, 69 y.o.   MRN: [id number]  HPI: Austin Weber is a 69 y.o. male presenting on 12/08/2019 for Medical Management of Chronic Issues, Diabetes, and Hypertension   HPI Type 2 diabetes mellitus Patient comes in today for recheck of his diabetes. Patient has been currently taking glipizide and Metformin and Prandin. Patient is currently on an ACE inhibitor/ARB. Patient has seen an ophthalmologist this year. Patient denies any issues with their feet. The symptom started onset as an adult hypertension hyperlipidemia and CKD stage III, slowly worsening, will send to nephrology ARE RELATED TO DM   Hypertension Patient is currently on amlodipine and carvedilol and losartan, and their blood pressure today is 169/87. Patient denies any lightheadedness or dizziness. Patient denies headaches, blurred vision, chest pains, shortness of breath, or weakness. Denies any side effects from medication and is content with current medication.   Hyperlipidemia Patient is coming in for recheck of his hyperlipidemia. The patient is currently taking atorvastatin. They deny any issues with myalgias or history of liver damage from it. They deny any focal numbness or weakness or chest pain.   CKD3 Patient is a stage III recheck  Relevant past medical, surgical, family and social history reviewed and updated as indicated. Interim medical history since our last visit reviewed. Allergies and medications reviewed and updated.  Review of Systems  Constitutional: Negative for chills and fever.  Respiratory: Negative for shortness of breath and wheezing.   Cardiovascular: Negative for chest pain and leg swelling.  Musculoskeletal: Negative for back pain and gait problem.  Skin: Negative for rash.  Neurological:  Negative for dizziness, seizures, weakness and numbness.  All other systems reviewed and are negative.   Per HPI unless specifically indicated above   Allergies as of 12/08/2019   No Known Allergies     Medication List       Accurate as of December 08, 2019  8:19 AM. If you have any questions, ask your nurse or doctor.        amLODipine 10 MG tablet Commonly known as: NORVASC Take 1 tablet (10 mg total) by mouth daily.   aspirin EC 81 MG tablet Take 81 mg by mouth 2 (two) times daily.   atorvastatin 20 MG tablet Commonly known as: LIPITOR Take 1 tablet (20 mg total) by mouth daily.   carvedilol 6.25 MG tablet Commonly known as: COREG TAKE ONE (1) TABLET THREE (3) TIMES EACH DAY   glipiZIDE 10 MG 24 hr tablet Commonly known as: GLUCOTROL XL Take 1 tablet (10 mg total) by mouth 2 (two) times daily.   losartan 100 MG tablet Commonly known as: COZAAR Take 1 tablet (100 mg total) by mouth daily.   metFORMIN 500 MG 24 hr tablet Commonly known as: GLUCOPHAGE-XR Take 2 tablets (1,000 mg total) by mouth 2 (two) times daily.   repaglinide 2 MG tablet Commonly known as: PRANDIN Take 1 tablet (2 mg total) by mouth 3 (three) times daily before meals.   tadalafil 5 MG tablet Commonly known as: CIALIS TAKE ONE (1) TABLET EACH DAY        Objective:   BP (!) 169/82   Pulse (!) 102   Temp (!)  97.5 F (36.4 C)   Ht _0  (1.854 m)   Wt 203 lb (92.1 kg)   SpO2 99%   BMI 26.78 kg/m   Wt Readings from Last 3 Encounters:  12/08/19 203 lb (92.1 kg)  09/06/19 204 lb 6 oz (92.7 kg)  06/02/19 202 lb 6 oz (91.8 kg)    Physical Exam Vitals and nursing note reviewed.  Constitutional:      General: He is not in acute distress.    Appearance: He is well-developed and well-nourished. He is not diaphoretic.  Eyes:     General: No scleral icterus.    Extraocular Movements: EOM normal.     Conjunctiva/sclera: Conjunctivae normal.  Neck:     Thyroid: No thyromegaly.   Cardiovascular:     Rate and Rhythm: Normal rate and regular rhythm.     Pulses: Intact distal pulses.     Heart sounds: Normal heart sounds. No murmur heard.   Pulmonary:     Effort: Pulmonary effort is normal. No respiratory distress.     Breath sounds: Normal breath sounds. No wheezing.  Musculoskeletal:        General: No edema. Normal range of motion.     Cervical back: Neck supple.  Lymphadenopathy:     Cervical: No cervical adenopathy.  Skin:    General: Skin is warm and dry.     Findings: No rash.  Neurological:     Mental Status: He is alert and oriented to person, place, and time.     Coordination: Coordination normal.  Psychiatric:        Mood and Affect: Mood and affect normal.        Behavior: Behavior normal.     Results for orders placed or performed in visit on 12/07/19  Bayer DCA Hb A1c Waived  Result Value Ref Range   HB A1C (BAYER DCA - WAIVED) 7.9 (H) <7.0 %  Lipid panel  Result Value Ref Range   Cholesterol, Total 111 100 - 199 mg/dL   Triglycerides 147 0 - 149 mg/dL   HDL 31 (L) >39 mg/dL   VLDL Cholesterol Cal 26 5 - 40 mg/dL   LDL Chol Calc (NIH) 54 0 - 99 mg/dL   Chol/HDL Ratio 3.6 0.0 - 5.0 ratio  CMP14+EGFR  Result Value Ref Range   Glucose 161 (H) 65 - 99 mg/dL   BUN 27 8 - 27 mg/dL   Creatinine, Ser 1.93 (H) 0.76 - 1.27 mg/dL   GFR calc non Af Amer 34 (L) >59 mL/min/1.73   GFR calc Af Amer 39 (L) >59 mL/min/1.73   BUN/Creatinine Ratio 14 10 - 24   Sodium 139 134 - 144 mmol/L   Potassium 5.1 3.5 - 5.2 mmol/L   Chloride 105 96 - 106 mmol/L   CO2 21 20 - 29 mmol/L   Calcium 9.3 8.6 - 10.2 mg/dL   Total Protein 6.6 6.0 - 8.5 g/dL   Albumin 4.1 3.7 - 4.7 g/dL   Globulin, Total 2.5 1.5 - 4.5 g/dL   Albumin/Globulin Ratio 1.6 1.2 - 2.2   Bilirubin Total 0.4 0.0 - 1.2 mg/dL   Alkaline Phosphatase 81 44 - 121 IU/L   AST 13 0 - 40 IU/L   ALT 12 0 - 44 IU/L  CBC with Differential/Platelet  Result Value Ref Range   WBC 8.1 3.4 - 10.8  x10E3/uL   RBC 4.43 4.14 - 5.80 x10E6/uL   Hemoglobin 14.2 13.0 - 17.7 g/dL   Hematocrit 41.1 37.5 -  51.0 %   MCV 93 79 - 97 fL   MCH 32.1 26.6 - 33.0 pg   MCHC 34.5 31 - 35 g/dL   RDW 11.7 11.6 - 15.4 %   Platelets 208 150 - 450 x10E3/uL   Neutrophils 65 Not Estab. %   Lymphs 23 Not Estab. %   Monocytes 8 Not Estab. %   Eos 3 Not Estab. %   Basos 1 Not Estab. %   Neutrophils Absolute 5.2 1.40 - 7.00 x10E3/uL   Lymphocytes Absolute 1.9 0 - 3 x10E3/uL   Monocytes Absolute 0.7 0 - 0 x10E3/uL   EOS (ABSOLUTE) 0.2 0.0 - 0.4 x10E3/uL   Basophils Absolute 0.1 0 - 0 x10E3/uL   Immature Granulocytes 0 Not Estab. %   Immature Grans (Abs) 0.0 0.0 - 0.1 x10E3/uL    Assessment & Plan:   Problem List Items Addressed This Visit      Cardiovascular and Mediastinum   Hypertension associated with diabetes (HCC)   Relevant Medications   amLODipine (NORVASC) 10 MG tablet   atorvastatin (LIPITOR) 20 MG tablet   carvedilol (COREG) 6.25 MG tablet   glipiZIDE (GLUCOTROL XL) 10 MG 24 hr tablet   losartan (COZAAR) 100 MG tablet   metFORMIN (GLUCOPHAGE-XR) 500 MG 24 hr tablet   repaglinide (PRANDIN) 2 MG tablet   tadalafil (CIALIS) 5 MG tablet     Endocrine   Type 2 diabetes mellitus with other specified complication (HCC) - Primary   Relevant Medications   atorvastatin (LIPITOR) 20 MG tablet   glipiZIDE (GLUCOTROL XL) 10 MG 24 hr tablet   losartan (COZAAR) 100 MG tablet   metFORMIN (GLUCOPHAGE-XR) 500 MG 24 hr tablet   repaglinide (PRANDIN) 2 MG tablet   Other Relevant Orders   Ambulatory referral to Nephrology   BMP8+EGFR   Bayer DCA Hb A1c Waived   Hyperlipidemia associated with type 2 diabetes mellitus (HCC)   Relevant Medications   atorvastatin (LIPITOR) 20 MG tablet   glipiZIDE (GLUCOTROL XL) 10 MG 24 hr tablet   losartan (COZAAR) 100 MG tablet   metFORMIN (GLUCOPHAGE-XR) 500 MG 24 hr tablet   repaglinide (PRANDIN) 2 MG tablet   CKD stage 3 due to type 2 diabetes mellitus (HCC)    Relevant Medications   atorvastatin (LIPITOR) 20 MG tablet   glipiZIDE (GLUCOTROL XL) 10 MG 24 hr tablet   losartan (COZAAR) 100 MG tablet   metFORMIN (GLUCOPHAGE-XR) 500 MG 24 hr tablet   repaglinide (PRANDIN) 2 MG tablet   Other Relevant Orders   Ambulatory referral to Nephrology   BMP8+EGFR    Other Visit Diagnoses    Need for immunization against influenza       Relevant Orders   Flu Vaccine QUAD High Dose(Fluad) (Completed)      A1c is up slightly and renal function is up slightly, will refer to nephrology.  Focus on diet and exercise, if still in the next time may have to adjust medications. Follow up plan: Return if symptoms worsen or fail to improve, for 3 to 44-monthdiabetes.  Counseling provided for all of the vaccine components Orders Placed This Encounter  Procedures  . Flu Vaccine QUAD High Dose(Fluad)    JCaryl Pina MD [facility] 12/08/2019, 8:19 AM

## 2020-01-07 DIAGNOSIS — Z79899 Other long term (current) drug therapy: Secondary | ICD-10-CM | POA: Diagnosis not present

## 2020-01-07 DIAGNOSIS — I129 Hypertensive chronic kidney disease with stage 1 through stage 4 chronic kidney disease, or unspecified chronic kidney disease: Secondary | ICD-10-CM | POA: Diagnosis not present

## 2020-01-07 DIAGNOSIS — R809 Proteinuria, unspecified: Secondary | ICD-10-CM | POA: Diagnosis not present

## 2020-01-07 DIAGNOSIS — I5032 Chronic diastolic (congestive) heart failure: Secondary | ICD-10-CM | POA: Diagnosis not present

## 2020-01-07 DIAGNOSIS — N189 Chronic kidney disease, unspecified: Secondary | ICD-10-CM | POA: Diagnosis not present

## 2020-01-17 ENCOUNTER — Other Ambulatory Visit: Payer: Medicare Other

## 2020-01-17 ENCOUNTER — Other Ambulatory Visit: Payer: Self-pay

## 2020-01-17 DIAGNOSIS — E1129 Type 2 diabetes mellitus with other diabetic kidney complication: Secondary | ICD-10-CM | POA: Diagnosis not present

## 2020-01-17 DIAGNOSIS — N189 Chronic kidney disease, unspecified: Secondary | ICD-10-CM | POA: Diagnosis not present

## 2020-01-17 DIAGNOSIS — E559 Vitamin D deficiency, unspecified: Secondary | ICD-10-CM | POA: Diagnosis not present

## 2020-01-17 DIAGNOSIS — D509 Iron deficiency anemia, unspecified: Secondary | ICD-10-CM | POA: Diagnosis not present

## 2020-01-17 DIAGNOSIS — Z79899 Other long term (current) drug therapy: Secondary | ICD-10-CM | POA: Diagnosis not present

## 2020-01-17 DIAGNOSIS — E1122 Type 2 diabetes mellitus with diabetic chronic kidney disease: Secondary | ICD-10-CM | POA: Diagnosis not present

## 2020-01-17 DIAGNOSIS — R809 Proteinuria, unspecified: Secondary | ICD-10-CM | POA: Diagnosis not present

## 2020-01-17 DIAGNOSIS — I129 Hypertensive chronic kidney disease with stage 1 through stage 4 chronic kidney disease, or unspecified chronic kidney disease: Secondary | ICD-10-CM | POA: Diagnosis not present

## 2020-01-17 DIAGNOSIS — Z1159 Encounter for screening for other viral diseases: Secondary | ICD-10-CM | POA: Diagnosis not present

## 2020-02-04 ENCOUNTER — Other Ambulatory Visit (HOSPITAL_COMMUNITY): Payer: Self-pay | Admitting: Nephrology

## 2020-02-04 DIAGNOSIS — E211 Secondary hyperparathyroidism, not elsewhere classified: Secondary | ICD-10-CM | POA: Diagnosis not present

## 2020-02-04 DIAGNOSIS — E1122 Type 2 diabetes mellitus with diabetic chronic kidney disease: Secondary | ICD-10-CM | POA: Diagnosis not present

## 2020-02-04 DIAGNOSIS — N189 Chronic kidney disease, unspecified: Secondary | ICD-10-CM

## 2020-02-04 DIAGNOSIS — E559 Vitamin D deficiency, unspecified: Secondary | ICD-10-CM | POA: Diagnosis not present

## 2020-02-04 DIAGNOSIS — I5032 Chronic diastolic (congestive) heart failure: Secondary | ICD-10-CM | POA: Diagnosis not present

## 2020-02-04 DIAGNOSIS — I129 Hypertensive chronic kidney disease with stage 1 through stage 4 chronic kidney disease, or unspecified chronic kidney disease: Secondary | ICD-10-CM | POA: Diagnosis not present

## 2020-02-04 DIAGNOSIS — R808 Other proteinuria: Secondary | ICD-10-CM | POA: Diagnosis not present

## 2020-02-11 ENCOUNTER — Ambulatory Visit (HOSPITAL_COMMUNITY)
Admission: RE | Admit: 2020-02-11 | Discharge: 2020-02-11 | Disposition: A | Discharge status: Home / Self Care | Payer: Medicare Other | Source: Ambulatory Visit | Attending: Nephrology | Admitting: Nephrology

## 2020-02-11 ENCOUNTER — Other Ambulatory Visit: Payer: Self-pay

## 2020-02-11 DIAGNOSIS — N2889 Other specified disorders of kidney and ureter: Secondary | ICD-10-CM | POA: Diagnosis not present

## 2020-02-11 DIAGNOSIS — N281 Cyst of kidney, acquired: Secondary | ICD-10-CM | POA: Diagnosis not present

## 2020-02-11 DIAGNOSIS — N189 Chronic kidney disease, unspecified: Secondary | ICD-10-CM | POA: Diagnosis not present

## 2020-02-24 DIAGNOSIS — E559 Vitamin D deficiency, unspecified: Secondary | ICD-10-CM | POA: Diagnosis not present

## 2020-02-24 DIAGNOSIS — I129 Hypertensive chronic kidney disease with stage 1 through stage 4 chronic kidney disease, or unspecified chronic kidney disease: Secondary | ICD-10-CM | POA: Diagnosis not present

## 2020-02-24 DIAGNOSIS — R808 Other proteinuria: Secondary | ICD-10-CM | POA: Diagnosis not present

## 2020-02-24 DIAGNOSIS — E1122 Type 2 diabetes mellitus with diabetic chronic kidney disease: Secondary | ICD-10-CM | POA: Diagnosis not present

## 2020-02-24 DIAGNOSIS — I5032 Chronic diastolic (congestive) heart failure: Secondary | ICD-10-CM | POA: Diagnosis not present

## 2020-02-24 DIAGNOSIS — N189 Chronic kidney disease, unspecified: Secondary | ICD-10-CM | POA: Diagnosis not present

## 2020-02-24 DIAGNOSIS — N2889 Other specified disorders of kidney and ureter: Secondary | ICD-10-CM | POA: Diagnosis not present

## 2020-02-25 ENCOUNTER — Other Ambulatory Visit (HOSPITAL_COMMUNITY): Payer: Self-pay | Admitting: Nephrology

## 2020-02-25 ENCOUNTER — Other Ambulatory Visit: Payer: Self-pay | Admitting: Nephrology

## 2020-02-25 DIAGNOSIS — E1122 Type 2 diabetes mellitus with diabetic chronic kidney disease: Secondary | ICD-10-CM

## 2020-02-25 DIAGNOSIS — R809 Proteinuria, unspecified: Secondary | ICD-10-CM

## 2020-02-25 DIAGNOSIS — N2889 Other specified disorders of kidney and ureter: Secondary | ICD-10-CM

## 2020-03-09 ENCOUNTER — Encounter: Payer: Self-pay | Admitting: Family Medicine

## 2020-03-09 ENCOUNTER — Ambulatory Visit: Payer: Medicare Other | Admitting: Family Medicine

## 2020-03-10 ENCOUNTER — Other Ambulatory Visit: Payer: Self-pay

## 2020-03-10 ENCOUNTER — Ambulatory Visit (HOSPITAL_COMMUNITY)
Admission: RE | Admit: 2020-03-10 | Discharge: 2020-03-10 | Disposition: A | Discharge status: Home / Self Care | Payer: Medicare Other | Source: Ambulatory Visit | Attending: Nephrology | Admitting: Nephrology

## 2020-03-10 DIAGNOSIS — R809 Proteinuria, unspecified: Secondary | ICD-10-CM | POA: Diagnosis not present

## 2020-03-10 DIAGNOSIS — N2889 Other specified disorders of kidney and ureter: Secondary | ICD-10-CM | POA: Insufficient documentation

## 2020-03-10 DIAGNOSIS — E1122 Type 2 diabetes mellitus with diabetic chronic kidney disease: Secondary | ICD-10-CM | POA: Diagnosis not present

## 2020-03-10 DIAGNOSIS — I700 Atherosclerosis of aorta: Secondary | ICD-10-CM | POA: Diagnosis not present

## 2020-03-10 DIAGNOSIS — N281 Cyst of kidney, acquired: Secondary | ICD-10-CM | POA: Diagnosis not present

## 2020-03-10 MED ORDER — GADOBUTROL 1 MMOL/ML IV SOLN
10.0000 mL | Freq: Once | INTRAVENOUS | Status: AC | PRN
  Administered 2020-03-10: 10 mL via INTRAVENOUS

## 2020-03-31 ENCOUNTER — Other Ambulatory Visit: Payer: Self-pay

## 2020-03-31 ENCOUNTER — Ambulatory Visit (INDEPENDENT_AMBULATORY_CARE_PROVIDER_SITE_OTHER): Payer: Medicare Other | Admitting: Urology

## 2020-03-31 ENCOUNTER — Encounter: Payer: Self-pay | Admitting: Urology

## 2020-03-31 VITALS — BP 174/85 | HR 91 | Temp 97.8°F | Resp 14 | Ht 73.0 in | Wt 200.0 lb

## 2020-03-31 DIAGNOSIS — N281 Cyst of kidney, acquired: Secondary | ICD-10-CM | POA: Diagnosis not present

## 2020-03-31 LAB — MICROSCOPIC EXAMINATION
Bacteria, UA: NONE SEEN
Epithelial Cells (non renal): NONE SEEN /hpf (ref 0–10)
Renal Epithel, UA: NONE SEEN /hpf
WBC, UA: NONE SEEN /hpf (ref 0–5)

## 2020-03-31 LAB — URINALYSIS, ROUTINE W REFLEX MICROSCOPIC
Bilirubin, UA: NEGATIVE
Ketones, UA: NEGATIVE
Leukocytes,UA: NEGATIVE
Nitrite, UA: NEGATIVE
Specific Gravity, UA: 1.02 (ref 1.005–1.030)
Urobilinogen, Ur: 0.2 mg/dL (ref 0.2–1.0)
pH, UA: 7 (ref 5.0–7.5)

## 2020-03-31 NOTE — Progress Notes (Signed)
03/31/2020 10:27 AM   [name] 1950/06/17 [id number]  Referring provider: Dettinger, Fransisca Kaufmann, MD [place],  [place] [address]  Chief Complaint  Patient presents with  . New Patient (Initial Visit)    HPI: Mr Wendler is a 70yo here for evaluation of a right renal mass. He underwent renal US on 02/11/2020 which showed a right renal cyst and a 1.8cm solid mass. 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 Creatinine 15-1.9. No issues urinating.    PMH: Past Medical History:  Diagnosis Date  . Chronic kidney disease   . Diabetes mellitus without complication ([place])   . Hyperlipidemia   . Hypertension     Surgical History: Past Surgical History:  Procedure Laterality Date  . SHOULDER SURGERY Left     Home Medications:  Allergies as of 03/31/2020   No Known Allergies     Medication List       Accurate as of March 31, 2020 10:27 AM. If you have any questions, ask your nurse or doctor.        amLODipine 10 MG tablet Commonly known as: NORVASC Take 1 tablet (10 mg total) by mouth daily.   aspirin EC 81 MG tablet Take 81 mg by mouth 2 (two) times daily.   atorvastatin 20 MG tablet Commonly known as: LIPITOR Take 1 tablet (20 mg total) by mouth daily.   carvedilol 6.25 MG tablet Commonly known as: COREG TAKE ONE (1) TABLET THREE (3) TIMES EACH DAY   chlorthalidone 25 MG tablet Commonly known as: HYGROTON Take by mouth.   glipiZIDE 10 MG 24 hr tablet Commonly known as: GLUCOTROL XL Take 1 tablet (10 mg total) by mouth 2 (two) times daily.   losartan 100 MG tablet Commonly known as: COZAAR Take 1 tablet (100 mg total) by mouth daily.   metFORMIN 500 MG 24 hr tablet Commonly known as: GLUCOPHAGE-XR Take 2 tablets (1,000 mg total) by mouth 2 (two) times daily.   repaglinide 2 MG tablet Commonly known as: PRANDIN Take 1 tablet (2 mg total) by mouth 3 (three) times daily before meals.   tadalafil  5 MG tablet Commonly known as: CIALIS TAKE ONE (1) TABLET EACH DAY       Allergies: No Known Allergies  Family History: Family History  Problem Relation Age of Onset  . Heart disease Father     Social History:  reports that he has been smoking cigarettes. He has a 20.00 pack-year smoking history. He has never used smokeless tobacco. He reports that he does not drink alcohol and does not use drugs.  ROS: All other review of systems were reviewed and are negative except what is noted above in HPI  Physical Exam: BP (!) 174/85   Pulse 91   Temp 97.8 F (36.6 C) (Other (Comment))   Resp 14   Ht '6\' 1"'$  (1.854 m)   Wt 200 lb (90.7 kg)   BMI 26.39 kg/m   Constitutional:  Alert and oriented, No acute distress. HEENT: [place] AT, moist mucus membranes.  Trachea midline, no masses. Cardiovascular: No clubbing, cyanosis, or edema. Respiratory: Normal respiratory effort, no increased work of breathing. GI: Abdomen is soft, nontender, nondistended, no abdominal masses GU: No CVA tenderness.  Lymph: No cervical or inguinal lymphadenopathy. Skin: No rashes, bruises or suspicious lesions. Neurologic: Grossly intact, no focal deficits, moving all 4 extremities. Psychiatric: Normal mood and affect.  Laboratory Data: Lab Results  Component Value Date   WBC 8.1 12/07/2019   HGB 14.2 12/07/2019   HCT 41.1 12/07/2019   MCV 93 12/07/2019   PLT 208 12/07/2019    Lab Results  Component Value Date   CREATININE 1.93 (H) 12/07/2019    No results found for: PSA  No results found for: TESTOSTERONE  Lab Results  Component Value Date   HGBA1C 7.9 (H) 12/07/2019    Urinalysis No results found for: COLORURINE, APPEARANCEUR, LABSPEC, PHURINE, GLUCOSEU, HGBUR, BILIRUBINUR, KETONESUR, PROTEINUR, UROBILINOGEN, NITRITE, LEUKOCYTESUR  No results found for: LABMICR, [place], RBCUA, LABEPIT, MUCUS, BACTERIA  Pertinent Imaging: Renal US 2/4 and MRi 3/4: Images reviewed and discussed with the  patient No results found for this or any previous visit.  No results found for this or any previous visit.  No results found for this or any previous visit.  No results found for this or any previous visit.  Results for orders placed during the hospital encounter of 02/11/20  US RENAL  Narrative CLINICAL DATA:  Diabetes  EXAM: RENAL / URINARY TRACT ULTRASOUND COMPLETE  COMPARISON:  None.  FINDINGS: Right Kidney:  Renal measurements: 10.5 x 5.7 x 5.9 cm = volume: 186 mL. Echogenicity is within normal limits. No hydronephrosis. Within the interpolar kidney there is a hypoechoic mass with internal blood flow which measures 1.8 x 1.7 by 1.7 cm. There is a simple cyst in the superior pole which measures 0.8 x 1.1 by 1.4 cm.  Left Kidney:  Renal measurements: 12.5 x 6.4 x 5.1 cm = volume: 215 mL. Echogenicity within normal limits. No mass or hydronephrosis visualized.  Bladder:  Appears normal for degree of bladder distention.  Other:  None.  IMPRESSION: 1. There is a 1.8 cm hypodense mass in the RIGHT interpolar kidney. Recommend additional evaluation with dedicated renal cyst protocol CT or MRI. 2. No hydronephrosis.  These results will be called to the ordering clinician or representative by the Radiologist Assistant, and communication documented in the PACS or [employer].   Electronically Signed By: Valentino Saxon MD On: 02/11/2020 16:59  No results found for this or any previous visit.  No results found for this or any previous visit.  No results found for this or any previous visit.   Assessment & Plan:    1. Renal cyst -We discussed the benign nature of renal cysts. The patient needs no further workup for his renal cysts. RTC prn   No follow-ups on file.  Nicolette Bang, MD  [facility] [place]

## 2020-04-05 ENCOUNTER — Ambulatory Visit (INDEPENDENT_AMBULATORY_CARE_PROVIDER_SITE_OTHER): Payer: Medicare Other | Admitting: Family Medicine

## 2020-04-05 ENCOUNTER — Other Ambulatory Visit: Payer: Self-pay

## 2020-04-05 ENCOUNTER — Encounter: Payer: Self-pay | Admitting: Family Medicine

## 2020-04-05 VITALS — BP 153/72 | HR 83 | Temp 97.0°F | Ht 73.0 in | Wt 200.6 lb

## 2020-04-05 DIAGNOSIS — E1122 Type 2 diabetes mellitus with diabetic chronic kidney disease: Secondary | ICD-10-CM

## 2020-04-05 DIAGNOSIS — I152 Hypertension secondary to endocrine disorders: Secondary | ICD-10-CM

## 2020-04-05 DIAGNOSIS — N183 Chronic kidney disease, stage 3 unspecified: Secondary | ICD-10-CM

## 2020-04-05 DIAGNOSIS — E785 Hyperlipidemia, unspecified: Secondary | ICD-10-CM | POA: Diagnosis not present

## 2020-04-05 DIAGNOSIS — E1159 Type 2 diabetes mellitus with other circulatory complications: Secondary | ICD-10-CM | POA: Diagnosis not present

## 2020-04-05 DIAGNOSIS — E1169 Type 2 diabetes mellitus with other specified complication: Secondary | ICD-10-CM | POA: Diagnosis not present

## 2020-04-05 LAB — CBC WITH DIFFERENTIAL/PLATELET
Basophils Absolute: 0.1 10*3/uL (ref 0.0–0.2)
Basos: 1 %
EOS (ABSOLUTE): 0.3 10*3/uL (ref 0.0–0.4)
Eos: 4 %
Hematocrit: 40.4 % (ref 37.5–51.0)
Hemoglobin: 13.8 g/dL (ref 13.0–17.7)
Immature Grans (Abs): 0 10*3/uL (ref 0.0–0.1)
Immature Granulocytes: 1 %
Lymphocytes Absolute: 1.8 10*3/uL (ref 0.7–3.1)
Lymphs: 25 %
MCH: 31.4 pg (ref 26.6–33.0)
MCHC: 34.2 g/dL (ref 31.5–35.7)
MCV: 92 fL (ref 79–97)
Monocytes Absolute: 0.7 10*3/uL (ref 0.1–0.9)
Monocytes: 10 %
Neutrophils Absolute: 4.3 10*3/uL (ref 1.4–7.0)
Neutrophils: 59 %
Platelets: 183 10*3/uL (ref 150–450)
RBC: 4.39 x10E6/uL (ref 4.14–5.80)
RDW: 11.6 % (ref 11.6–15.4)
WBC: 7.2 10*3/uL (ref 3.4–10.8)

## 2020-04-05 LAB — LIPID PANEL
Chol/HDL Ratio: 3.5 ratio (ref 0.0–5.0)
Cholesterol, Total: 124 mg/dL (ref 100–199)
HDL: 35 mg/dL — ABNORMAL LOW (ref >39)
LDL Chol Calc (NIH): 66 mg/dL (ref 0–99)
Triglycerides: 131 mg/dL (ref 0–149)
VLDL Cholesterol Cal: 23 mg/dL (ref 5–40)

## 2020-04-05 LAB — CMP14+EGFR
ALT: 16 IU/L (ref 0–44)
AST: 10 IU/L (ref 0–40)
Albumin/Globulin Ratio: 1.6 (ref 1.2–2.2)
Albumin: 3.9 g/dL (ref 3.7–4.7)
Alkaline Phosphatase: 68 IU/L (ref 44–121)
BUN/Creatinine Ratio: 22 (ref 10–24)
BUN: 43 mg/dL — ABNORMAL HIGH (ref 8–27)
Bilirubin Total: 0.3 mg/dL (ref 0.0–1.2)
CO2: 20 mmol/L (ref 20–29)
Calcium: 9.3 mg/dL (ref 8.6–10.2)
Chloride: 106 mmol/L (ref 96–106)
Creatinine, Ser: 1.92 mg/dL — ABNORMAL HIGH (ref 0.76–1.27)
Globulin, Total: 2.4 g/dL (ref 1.5–4.5)
Glucose: 153 mg/dL — ABNORMAL HIGH (ref 65–99)
Potassium: 5 mmol/L (ref 3.5–5.2)
Sodium: 141 mmol/L (ref 134–144)
Total Protein: 6.3 g/dL (ref 6.0–8.5)
eGFR: 37 mL/min/{1.73_m2} — ABNORMAL LOW (ref >59)

## 2020-04-05 LAB — BAYER DCA HB A1C WAIVED: HB A1C (BAYER DCA - WAIVED): 7.7 % — ABNORMAL HIGH (ref <7.0)

## 2020-04-05 MED ORDER — TERAZOSIN HCL 2 MG PO CAPS: 2.0000 mg | ORAL_CAPSULE | Freq: Every day | ORAL | 3 refills | Status: DC

## 2020-04-05 MED ORDER — GLIPIZIDE ER 10 MG PO TB24: 10.0000 mg | ORAL_TABLET | Freq: Two times a day (BID) | ORAL | 3 refills | Status: DC

## 2020-04-05 MED ORDER — METFORMIN HCL ER 500 MG PO TB24: 500.0000 mg | ORAL_TABLET | Freq: Two times a day (BID) | ORAL | 3 refills | Status: DC

## 2020-04-05 MED ORDER — AMLODIPINE BESYLATE 10 MG PO TABS: 10.0000 mg | ORAL_TABLET | Freq: Every day | ORAL | 3 refills | Status: DC

## 2020-04-05 MED ORDER — LOSARTAN POTASSIUM 100 MG PO TABS: 100.0000 mg | ORAL_TABLET | Freq: Every day | ORAL | 3 refills | Status: DC

## 2020-04-05 MED ORDER — ATORVASTATIN CALCIUM 20 MG PO TABS: 20.0000 mg | ORAL_TABLET | Freq: Every day | ORAL | 3 refills | Status: DC

## 2020-04-05 NOTE — Patient Instructions (Signed)
Will stop the chlorthalidone because of symptoms of urinary frequency.  We will start Terazosin and see if that helps with his blood pressure.  Kidney function is stable but because of kidney function will have to reduce Metformin.  Only take 500 twice a day

## 2020-04-05 NOTE — Progress Notes (Signed)
BP (!) 153/72   Pulse 83   Temp (!) 97 F (36.1 C) (Temporal)   Ht _0  (1.854 m)   Wt 200 lb 9.6 oz (91 kg)   SpO2 99%   BMI 26.47 kg/m    Subjective:   Patient ID: Austin Weber, male    DOB: 04-Jan-1951, 70 y.o.   MRN: [id number]  HPI: Austin Weber is a 70 y.o. male presenting on 04/05/2020 for Medical Management of Chronic Issues, Diabetes (DM eye request sent to Austin Weber through epic), and Hypertension   HPI Type 2 diabetes mellitus Patient comes in today for recheck of his diabetes. Patient has been currently taking Metformin and glipizide and Prandin, A1c 7.7. Patient is currently on an ACE inhibitor/ARB. Patient has not seen an ophthalmologist this year. Patient denies any issues with their feet. The symptom started onset as an adult hypertension and hyperlipidemia and CKD 3 ARE RELATED TO DM   Hypertension Patient is currently on carvedilol and chlorthalidone and losartan and amlodipine, and their blood pressure today is 153/72. Patient denies any lightheadedness or dizziness. Patient denies headaches, blurred vision, chest pains, shortness of breath, or weakness. Denies any side effects from medication and is content with current medication.  Patient is having a lot of urination, very bothersome for him and has to urinate multiple times a day.  He says that he has good stream and good flow and good volume but he just has to go very frequently.  He says this is been worsening recently.  Hyperlipidemia Patient is coming in for recheck of his hyperlipidemia. The patient is currently taking atorvastatin. They deny any issues with myalgias or history of liver damage from it. They deny any focal numbness or weakness or chest pain.   Relevant past medical, surgical, family and social history reviewed and updated as indicated. Interim medical history since our last visit reviewed. Allergies and medications reviewed and updated.  Review of Systems  Constitutional: Negative for  chills and fever.  Eyes: Negative for visual disturbance.  Respiratory: Negative for shortness of breath and wheezing.   Cardiovascular: Negative for chest pain and leg swelling.  Genitourinary: Positive for frequency.  Musculoskeletal: Negative for back pain and gait problem.  Skin: Negative for rash.  Neurological: Negative for dizziness, weakness and light-headedness.  All other systems reviewed and are negative.   Per HPI unless specifically indicated above   Allergies as of 04/05/2020   No Known Allergies     Medication List       Accurate as of April 05, 2020  8:19 AM. If you have any questions, ask your nurse or doctor.        amLODipine 10 MG tablet Commonly known as: NORVASC Take 1 tablet (10 mg total) by mouth daily.   aspirin EC 81 MG tablet Take 81 mg by mouth 2 (two) times daily.   atorvastatin 20 MG tablet Commonly known as: LIPITOR Take 1 tablet (20 mg total) by mouth daily.   carvedilol 6.25 MG tablet Commonly known as: COREG TAKE ONE (1) TABLET THREE (3) TIMES EACH DAY   chlorthalidone 25 MG tablet Commonly known as: HYGROTON Take by mouth.   glipiZIDE 10 MG 24 hr tablet Commonly known as: GLUCOTROL XL Take 1 tablet (10 mg total) by mouth 2 (two) times daily.   losartan 100 MG tablet Commonly known as: COZAAR Take 1 tablet (100 mg total) by mouth daily.   metFORMIN 500 MG 24 hr tablet Commonly known as: GLUCOPHAGE-XR  Take 2 tablets (1,000 mg total) by mouth 2 (two) times daily.   repaglinide 2 MG tablet Commonly known as: PRANDIN Take 1 tablet (2 mg total) by mouth 3 (three) times daily before meals.   tadalafil 5 MG tablet Commonly known as: CIALIS TAKE ONE (1) TABLET EACH DAY        Objective:   BP (!) 153/72   Pulse 83   Temp (!) 97 F (36.1 C) (Temporal)   Ht _0  (1.854 m)   Wt 200 lb 9.6 oz (91 kg)   SpO2 99%   BMI 26.47 kg/m   Wt Readings from Last 3 Encounters:  04/05/20 200 lb 9.6 oz (91 kg)  03/31/20 200 lb  (90.7 kg)  12/08/19 203 lb (92.1 kg)    Physical Exam Vitals and nursing note reviewed.  Constitutional:      General: He is not in acute distress.    Appearance: He is well-developed. He is not diaphoretic.  Eyes:     General: No scleral icterus.    Conjunctiva/sclera: Conjunctivae normal.  Neck:     Thyroid: No thyromegaly.  Cardiovascular:     Rate and Rhythm: Normal rate and regular rhythm.     Heart sounds: Normal heart sounds. No murmur heard.   Pulmonary:     Effort: Pulmonary effort is normal. No respiratory distress.     Breath sounds: Normal breath sounds. No wheezing.  Musculoskeletal:        General: Normal range of motion.     Cervical back: Neck supple.  Lymphadenopathy:     Cervical: No cervical adenopathy.  Skin:    General: Skin is warm and dry.     Findings: No rash.  Neurological:     Mental Status: He is alert and oriented to person, place, and time.     Coordination: Coordination normal.  Psychiatric:        Behavior: Behavior normal.       Assessment & Plan:   Problem List Items Addressed This Visit      Cardiovascular and Mediastinum   Hypertension associated with diabetes ([place]) - Primary   Relevant Orders   Lipid panel     Endocrine   Type 2 diabetes mellitus with other specified complication ([place])   Relevant Orders   CBC with Differential/Platelet   Lipid panel   Bayer DCA Hb A1c Waived (Completed)   Microalbumin / creatinine urine ratio   Hyperlipidemia associated with type 2 diabetes mellitus ([place])   CKD stage 3 due to type 2 diabetes mellitus ([place])   Relevant Orders   CMP14+EGFR      Will stop the chlorthalidone because of symptoms of urinary frequency.  We will start Terazosin and see if that helps with his blood pressure.  Kidney function is stable but because of kidney function will have to reduce Metformin.  Only take 500 twice a day Follow up plan: Return in about 3 months (around 07/06/2020), or if symptoms worsen or  fail to improve, for Diabetes and hypertension recheck.  Counseling provided for all of the vaccine components Orders Placed This Encounter  Procedures  . CBC with Differential/Platelet  . CMP14+EGFR  . Lipid panel  . Bayer DCA Hb A1c Waived  . Microalbumin / creatinine urine ratio    Caryl Pina, MD [facility] 04/05/2020, 8:19 AM

## 2020-04-17 ENCOUNTER — Other Ambulatory Visit: Payer: Self-pay | Admitting: Family Medicine

## 2020-04-20 ENCOUNTER — Ambulatory Visit (INDEPENDENT_AMBULATORY_CARE_PROVIDER_SITE_OTHER): Payer: Medicare Other

## 2020-04-20 VITALS — Ht 73.0 in | Wt 200.0 lb

## 2020-04-20 DIAGNOSIS — Z0000 Encounter for general adult medical examination without abnormal findings: Secondary | ICD-10-CM

## 2020-04-20 NOTE — Patient Instructions (Addendum)
Mr. Austin Weber , Thank you for taking time to come for your Medicare Wellness Visit. I appreciate your ongoing commitment to your health goals. Please review the following plan we discussed and let me know if I can assist you in the future.   Screening recommendations/referrals: Colonoscopy: patient declines Recommended yearly ophthalmology/optometry visit for glaucoma screening and checkup Recommended yearly dental visit for hygiene and checkup  Vaccinations: Influenza vaccine: Done 12/08/2019 - Repeat annually Pneumococcal vaccine: Done 02/28/2016 & 03/31/2017 Tdap vaccine: Done 05/07/2009 - Due every 10 years Shingles vaccine: patient declined   Covid-19: Done 09/15/19 & 10/13/19 - due for booster  Advanced directives: Please bring a copy of your health care power of attorney and living will to the office to be added to your chart at your convenience.  Conditions/risks identified: If you wish to quit smoking, help is available. For free tobacco cessation program offerings call the [facility] at 231-506-2685 or Live Well Line at (779) 370-0834. You may also visit www.[email or web address] or email [email or web address]@[place]'$ [email or web address] for more information on other programs.   You may also call 1-800-QUIT-NOW (959) 181-1757) or visit www.VirusCrisis.dk or www.BecomeAnEx.org for additional resources on smoking cessation.    Next appointment: Follow up in one year for your annual wellness visit.   Preventive Care 72 Years and Older, Male  Preventive care refers to lifestyle choices and visits with your health care provider that can promote health and wellness. What does preventive care include?  A yearly physical exam. This is also called an annual well check.  Dental exams once or twice a year.  Routine eye exams. Ask your health care provider how often you should have your eyes checked.  Personal lifestyle choices, including:  Daily care of your teeth and gums.  Regular physical  activity.  Eating a healthy diet.  Avoiding tobacco and drug use.  Limiting alcohol use.  Practicing safe sex.  Taking low doses of aspirin every day.  Taking vitamin and mineral supplements as recommended by your health care provider. What happens during an annual well check? The services and screenings done by your health care provider during your annual well check will depend on your age, overall health, lifestyle risk factors, and family history of disease. Counseling  Your health care provider may ask you questions about your:  Alcohol use.  Tobacco use.  Drug use.  Emotional well-being.  Home and relationship well-being.  Sexual activity.  Eating habits.  History of falls.  Memory and ability to understand (cognition).  Work and work Statistician. Screening  You may have the following tests or measurements:  Height, weight, and BMI.  Blood pressure.  Lipid and cholesterol levels. These may be checked every 5 years, or more frequently if you are over 60 years old.  Skin check.  Lung cancer screening. You may have this screening every year starting at age 76 if you have a 30-pack-year history of smoking and currently smoke or have quit within the past 15 years.  Fecal occult blood test (FOBT) of the stool. You may have this test every year starting at age 3.  Flexible sigmoidoscopy or colonoscopy. You may have a sigmoidoscopy every 5 years or a colonoscopy every 10 years starting at age 91.  Prostate cancer screening. Recommendations will vary depending on your family history and other risks.  Hepatitis C blood test.  Hepatitis B blood test.  Sexually transmitted disease (STD) testing.  Diabetes screening. This is done by checking your blood sugar (glucose) after you  have not eaten for a while (fasting). You may have this done every 1-3 years.  Abdominal aortic aneurysm (AAA) screening. You may need this if you are a current or former  smoker.  Osteoporosis. You may be screened starting at age 62 if you are at high risk. Talk with your health care provider about your test results, treatment options, and if necessary, the need for more tests. Vaccines  Your health care provider may recommend certain vaccines, such as:  Influenza vaccine. This is recommended every year.  Tetanus, diphtheria, and acellular pertussis (Tdap, Td) vaccine. You may need a Td booster every 10 years.  Zoster vaccine. You may need this after age 27.  Pneumococcal 13-valent conjugate (PCV13) vaccine. One dose is recommended after age 21.  Pneumococcal polysaccharide (PPSV23) vaccine. One dose is recommended after age 38. Talk to your health care provider about which screenings and vaccines you need and how often you need them. This information is not intended to replace advice given to you by your health care provider. Make sure you discuss any questions you have with your health care provider. Document Released: 01/20/2015 Document Revised: 09/13/2015 Document Reviewed: 10/25/2014 Elsevier Interactive Patient Education  2017 [place] Prevention in the Home Falls can cause injuries. They can happen to people of all ages. There are many things you can do to make your home safe and to help prevent falls. What can I do on the outside of my home?  Regularly fix the edges of walkways and driveways and fix any cracks.  Remove anything that might make you trip as you walk through a door, such as a raised step or threshold.  Trim any bushes or trees on the path to your home.  Use bright outdoor lighting.  Clear any walking paths of anything that might make someone trip, such as rocks or tools.  Regularly check to see if handrails are loose or broken. Make sure that both sides of any steps have handrails.  Any raised decks and porches should have guardrails on the edges.  Have any leaves, snow, or ice cleared regularly.  Use sand or  salt on walking paths during winter.  Clean up any spills in your garage right away. This includes oil or grease spills. What can I do in the bathroom?  Use night lights.  Install grab bars by the toilet and in the tub and shower. Do not use towel bars as grab bars.  Use non-skid mats or decals in the tub or shower.  If you need to sit down in the shower, use a plastic, non-slip stool.  Keep the floor dry. Clean up any water that spills on the floor as soon as it happens.  Remove soap buildup in the tub or shower regularly.  Attach bath mats securely with double-sided non-slip rug tape.  Do not have throw rugs and other things on the floor that can make you trip. What can I do in the bedroom?  Use night lights.  Make sure that you have a light by your bed that is easy to reach.  Do not use any sheets or blankets that are too big for your bed. They should not hang down onto the floor.  Have a firm chair that has side arms. You can use this for support while you get dressed.  Do not have throw rugs and other things on the floor that can make you trip. What can I do in the kitchen?  Clean up  any spills right away.  Avoid walking on wet floors.  Keep items that you use a lot in easy-to-reach places.  If you need to reach something above you, use a strong step stool that has a grab bar.  Keep electrical cords out of the way.  Do not use floor polish or wax that makes floors slippery. If you must use wax, use non-skid floor wax.  Do not have throw rugs and other things on the floor that can make you trip. What can I do with my stairs?  Do not leave any items on the stairs.  Make sure that there are handrails on both sides of the stairs and use them. Fix handrails that are broken or loose. Make sure that handrails are as long as the stairways.  Check any carpeting to make sure that it is firmly attached to the stairs. Fix any carpet that is loose or worn.  Avoid having  throw rugs at the top or bottom of the stairs. If you do have throw rugs, attach them to the floor with carpet tape.  Make sure that you have a light switch at the top of the stairs and the bottom of the stairs. If you do not have them, ask someone to add them for you. What else can I do to help prevent falls?  Wear shoes that:  Do not have high heels.  Have rubber bottoms.  Are comfortable and fit you well.  Are closed at the toe. Do not wear sandals.  If you use a stepladder:  Make sure that it is fully opened. Do not climb a closed stepladder.  Make sure that both sides of the stepladder are locked into place.  Ask someone to hold it for you, if possible.  Clearly mark and make sure that you can see:  Any grab bars or handrails.  First and last steps.  Where the edge of each step is.  Use tools that help you move around (mobility aids) if they are needed. These include:  Canes.  Walkers.  Scooters.  Crutches.  Turn on the lights when you go into a dark area. Replace any light bulbs as soon as they burn out.  Set up your furniture so you have a clear path. Avoid moving your furniture around.  If any of your floors are uneven, fix them.  If there are any pets around you, be aware of where they are.  Review your medicines with your doctor. Some medicines can make you feel dizzy. This can increase your chance of falling. Ask your doctor what other things that you can do to help prevent falls. This information is not intended to replace advice given to you by your health care provider. Make sure you discuss any questions you have with your health care provider. Document Released: 10/20/2008 Document Revised: 06/01/2015 Document Reviewed: 01/28/2014 Elsevier Interactive Patient Education  2017 [employer]. 6

## 2020-04-20 NOTE — Progress Notes (Addendum)
Subjective:   Austin Weber is a 70 y.o. male who presents for Medicare Annual/Subsequent preventive examination.  Virtual Visit via Telephone Note  I connected with  Austin Weber on 04/20/20 at  8:15 AM EDT by telephone and verified that I am speaking with the correct person using two identifiers.  Location: Patient: Home Provider: [facility] Persons participating in the virtual visit: patient/Nurse Health Advisor   I discussed the limitations, risks, security and privacy concerns of performing an evaluation and management service by telephone and the availability of in person appointments. The patient expressed understanding and agreed to proceed.  Interactive audio and video telecommunications were attempted between this nurse and patient, however failed, due to patient having technical difficulties OR patient did not have access to video capability.  We continued and completed visit with audio only.  Some vital signs may be absent or patient reported.   Fenix Rorke E Junie Engram, LPN   Review of Systems     Cardiac Risk Factors include: advanced age (>53mn, >>59women);sedentary lifestyle;dyslipidemia;diabetes mellitus;hypertension;smoking/ tobacco exposure;male gender;microalbuminuria     Objective:    Today's Vitals   04/20/20 0817  Weight: 200 lb (90.7 kg)  Height: '6\' 1"'$  (1.854 m)   Body mass index is 26.39 kg/m.  Advanced Directives 04/20/2020 04/01/2019  Does Patient Have a Medical Advance Directive? Yes No;Yes  Type of [place]of [place] will Living will  Does patient want to make changes to medical advance directive? - No - Patient declined  Copy of [place] Chart? No - copy requested -    Current Medications (verified) Outpatient Encounter Medications as of 04/20/2020  Medication Sig  . amLODipine (NORVASC) 10 MG tablet Take 1 tablet (10 mg total) by mouth daily.  .Marland Kitchenaspirin EC 81 MG tablet Take 81 mg by mouth 2  (two) times daily.  .Marland Kitchenatorvastatin (LIPITOR) 20 MG tablet Take 1 tablet (20 mg total) by mouth daily.  . carvedilol (COREG) 6.25 MG tablet TAKE ONE (1) TABLET THREE (3) TIMES EACH DAY  . glipiZIDE (GLUCOTROL XL) 10 MG 24 hr tablet Take 1 tablet (10 mg total) by mouth 2 (two) times daily.  .Marland Kitchenlosartan (COZAAR) 100 MG tablet Take 1 tablet (100 mg total) by mouth daily.  . metFORMIN (GLUCOPHAGE-XR) 500 MG 24 hr tablet Take 1 tablet (500 mg total) by mouth 2 (two) times daily.  . repaglinide (PRANDIN) 2 MG tablet Take 1 tablet (2 mg total) by mouth 3 (three) times daily before meals.  . tadalafil (CIALIS) 5 MG tablet TAKE ONE (1) TABLET EACH DAY  . terazosin (HYTRIN) 2 MG capsule Take 1 capsule (2 mg total) by mouth at bedtime.   No facility-administered encounter medications on file as of 04/20/2020.    Allergies (verified) Patient has no known allergies.   History: Past Medical History:  Diagnosis Date  . Chronic kidney disease   . Diabetes mellitus without complication ([place]   . Hyperlipidemia   . Hypertension    Past Surgical History:  Procedure Laterality Date  . SHOULDER SURGERY Left    Family History  Problem Relation Age of Onset  . Heart disease Father    Social History   Socioeconomic History  . Marital status: Legally Separated    Spouse name: SSebashtian Zweig . Number of children: 1  . Years of education: Not on file  . Highest education level: 12th grade  Occupational History  . Occupation: Retired  Tobacco Use  . Smoking  status: Current Every Day Smoker    Packs/day: 0.50    Years: 40.00    Pack years: 20.00    Types: Cigarettes  . Smokeless tobacco: Never Used  . Tobacco comment: down from 2 ppd  Vaping Use  . Vaping Use: Never used  Substance and Sexual Activity  . Alcohol use: Never  . Drug use: Never  . Sexual activity: Not Currently  Other Topics Concern  . Not on file  Social History Narrative   Lives alone. Lots of family nearby   Social  Determinants of Health   Financial Resource Strain: Low Risk   . Difficulty of Paying Living Expenses: Not very hard  Food Insecurity: No Food Insecurity  . Worried About Charity fundraiser in the Last Year: Never true  . Ran Out of Food in the Last Year: Never true  Transportation Needs: No Transportation Needs  . Lack of Transportation (Medical): No  . Lack of Transportation (Non-Medical): No  Physical Activity: Insufficiently Active  . Days of Exercise per Week: 7 days  . Minutes of Exercise per Session: 20 min  Stress: No Stress Concern Present  . Feeling of Stress : Not at all  Social Connections: Socially Isolated  . Frequency of Communication with Friends and Family: Twice a week  . Frequency of Social Gatherings with Friends and Family: Once a week  . Attends Religious Services: Never  . Active Member of Clubs or Organizations: No  . Attends Archivist Meetings: Never  . Marital Status: Separated    Tobacco Counseling Ready to quit: No Counseling given: Yes Comment: down from 2 ppd   Clinical Intake:  Pre-visit preparation completed: Yes  Pain : No/denies pain     BMI - recorded: 26.39 Nutritional Status: BMI 25 -29 Overweight Nutritional Risks: None Diabetes: Yes CBG done?: No (86 fasting this morning per patient) Did pt. bring in CBG monitor from home?: No  How often do you need to have someone help you when you read instructions, pamphlets, or other written materials from your doctor or pharmacy?: 1 - Never  Nutrition Risk Assessment:  Has the patient had any N/V/D within the last 2 months?  No  Does the patient have any non-healing wounds?  No  Has the patient had any unintentional weight loss or weight gain?  No   Diabetes:  Is the patient diabetic?  Yes  If diabetic, was a CBG obtained today?  No  Did the patient bring in their glucometer from home?  No  How often do you monitor your CBG's? Once daily fasting - today it was 86.    Financial Strains and Diabetes Management:  Are you having any financial strains with the device, your supplies or your medication? No .  Does the patient want to be seen by Chronic Care Management for management of their diabetes?  No  Would the patient like to be referred to a Nutritionist or for Diabetic Management?  No   Diabetic Exams:  Diabetic Eye Exam: Completed 02/2020 - I will request records from Dr Marin Comment  Diabetic Foot Exam: Completed 12/08/2019. Pt has been advised about the importance in completing this exam. Pt is scheduled for diabetic foot exam on 06/28/2020.    Interpreter Needed?: No  Information entered by :: Carlisle Enke, LPN   Activities of Daily Living In your present state of health, do you have any difficulty performing the following activities: 04/20/2020  Hearing? N  Vision? N  Difficulty concentrating  or making decisions? N  Walking or climbing stairs? N  Dressing or bathing? N  Doing errands, shopping? N  Preparing Food and eating ? N  Using the Toilet? N  In the past six months, have you accidently leaked urine? N  Do you have problems with loss of bowel control? N  Managing your Medications? N  Managing your Finances? N  Housekeeping or managing your Housekeeping? N  Some recent data might be hidden    Patient Care Team: Dettinger, Fransisca Kaufmann, MD as PCP - General (Family Medicine) Harlen Labs, MD as Referring Physician (Optometry)  Indicate any recent Medical Services you may have received from other than [facility] providers in the past year (date may be approximate).     Assessment:   This is a routine wellness examination for Arlynn.  Hearing/Vision screen  Hearing Screening   '125Hz'$  '250Hz'$  '500Hz'$  '1000Hz'$  '2000Hz'$  '3000Hz'$  '4000Hz'$  '6000Hz'$  '8000Hz'$   Right ear:           Left ear:           Comments: Denies hearing difficulty  Vision Screening Comments: Annual visits with Dr Marin Comment in [place] - up to date with eye exams  Dietary issues and exercise  activities discussed: Current Exercise Habits: The patient does not participate in regular exercise at present, Exercise limited by: None identified  Goals    . Patient Stated     Stay active and healthy        Depression Screen PHQ 2/9 Scores 04/20/2020 04/05/2020 12/08/2019 09/06/2019 06/02/2019 04/01/2019 03/04/2019  PHQ - 2 Score 0 0 0 0 0 0 0    Fall Risk Fall Risk  04/20/2020 04/05/2020 12/08/2019 09/06/2019 06/02/2019  Falls in the past year? 0 0 0 0 0  Number falls in past yr: 0 - - - -  Injury with Fall? 0 - - - -  Risk for fall due to : No Fall Risks - - - -  Follow up Falls prevention discussed - - - -    FALL RISK PREVENTION PERTAINING TO THE HOME:  Any stairs in or around the home? Yes  If so, are there any without handrails? No  Home free of loose throw rugs in walkways, pet beds, electrical cords, etc? Yes  Adequate lighting in your home to reduce risk of falls? Yes   ASSISTIVE DEVICES UTILIZED TO PREVENT FALLS:  Life alert? No  Use of a cane, walker or w/c? No  Grab bars in the bathroom? Yes  Shower chair or bench in shower? No  Elevated toilet seat or a handicapped toilet? No   TIMED UP AND GO:  Was the test performed? No . Telephonic visit  Cognitive Function:     6CIT Screen 04/01/2019  What Year? 0 points  What month? 0 points  What time? 0 points  Count back from 20 0 points  Months in reverse 0 points  Repeat phrase 0 points  Total Score 0    Immunizations Immunization History  Administered Date(s) Administered  . Fluad Quad(high Dose 65+) 11/27/2018, 12/08/2019  . Influenza, High Dose Seasonal PF 11/17/2014, 11/29/2015, 09/26/2016, 10/09/2017  . Influenza-Unspecified 12/29/2013, 11/29/2015, 09/26/2016, 10/09/2017  . Moderna Sars-Covid-2 Vaccination 09/15/2019, 10/13/2019  . Pneumococcal Conjugate-13 02/28/2016  . Pneumococcal Polysaccharide-23 03/31/2017  . Tdap 05/07/2009    TDAP status: Due, Education has been provided regarding the  importance of this vaccine. Advised may receive this vaccine at local pharmacy or Health Dept. Aware to provide a copy of  the vaccination record if obtained from local pharmacy or Health Dept. Verbalized acceptance and understanding.  Flu Vaccine status: Up to date  Pneumococcal vaccine status: Up to date  Covid-19 vaccine status: Completed vaccines  Qualifies for Shingles Vaccine? Yes   Zostavax completed No   Shingrix Completed?: No.    Education has been provided regarding the importance of this vaccine. Patient has been advised to call insurance company to determine out of pocket expense if they have not yet received this vaccine. Advised may also receive vaccine at local pharmacy or Health Dept. Verbalized acceptance and understanding.  Screening Tests Health Maintenance  Topic Date Due  . OPHTHALMOLOGY EXAM  02/23/2020  . COVID-19 Vaccine (3 - Booster for Moderna series) 04/12/2020  . TETANUS/TDAP  06/01/2020 (Originally 05/08/2019)  . COLONOSCOPY (Pts 45-65yr Insurance coverage will need to be confirmed)  12/07/2020 (Originally 06/03/1993)  . Hepatitis C Screening  12/07/2020 (Originally 5September 02, 1952  . INFLUENZA VACCINE  08/07/2020  . HEMOGLOBIN A1C  10/06/2020  . FOOT EXAM  12/07/2020  . PNA vac Low Risk Adult  Completed  . HPV VACCINES  Aged Out    Health Maintenance  Health Maintenance Due  Topic Date Due  . OPHTHALMOLOGY EXAM  02/23/2020  . COVID-19 Vaccine (3 - Booster for Moderna series) 04/12/2020   Colorectal cancer screening: patient declines any  Lung Cancer Screening: (Low Dose CT Chest recommended if Age 72-80years, 30 pack-year currently smoking OR have quit w/in 15years.) does qualify.   Lung Cancer Screening Referral: patient declined  Additional Screening:  Hepatitis C Screening: does qualify; Needs to have this drawn with next routine labs  Vision Screening: Recommended annual ophthalmology exams for early detection of glaucoma and other disorders of  the eye. Is the patient up to date with their annual eye exam?  Yes  Who is the provider or what is the name of the office in which the patient attends annual eye exams? [place] pt is not established with a provider, would they like to be referred to a provider to establish care? No .   Dental Screening: Recommended annual dental exams for proper oral hygiene  Community Resource Referral / Chronic Care Management: CRR required this visit?  No   CCM required this visit?  No      Plan:     I have personally reviewed and noted the following in the patient's chart:   . Medical and social history . Use of alcohol, tobacco or illicit drugs  . Current medications and supplements . Functional ability and status . Nutritional status . Physical activity . Advanced directives . List of other physicians . Hospitalizations, surgeries, and ER visits in previous 12 months . Vitals . Screenings to include cognitive, depression, and falls . Referrals and appointments  In addition, I have reviewed and discussed with patient certain preventive protocols, quality metrics, and best practice recommendations. A written personalized care plan for preventive services as well as general preventive health recommendations were provided to patient.     ASandrea Hammond LPN   [id number]  Nurse Notes: He is due for tetanus vaccine and Hep C screening at next visit. He declines any other screenings or vaccines.  I have reviewed and agree with the above AWV documentation JCaryl Pina MD [facility] 04/24/2020, 10:53 AM

## 2020-04-25 ENCOUNTER — Other Ambulatory Visit: Payer: Self-pay | Admitting: Family Medicine

## 2020-04-25 DIAGNOSIS — E1169 Type 2 diabetes mellitus with other specified complication: Secondary | ICD-10-CM

## 2020-04-26 ENCOUNTER — Other Ambulatory Visit: Payer: Medicare Other

## 2020-04-26 DIAGNOSIS — R808 Other proteinuria: Secondary | ICD-10-CM | POA: Diagnosis not present

## 2020-04-26 DIAGNOSIS — I5032 Chronic diastolic (congestive) heart failure: Secondary | ICD-10-CM | POA: Diagnosis not present

## 2020-04-26 DIAGNOSIS — E559 Vitamin D deficiency, unspecified: Secondary | ICD-10-CM | POA: Diagnosis not present

## 2020-04-26 DIAGNOSIS — E1122 Type 2 diabetes mellitus with diabetic chronic kidney disease: Secondary | ICD-10-CM | POA: Diagnosis not present

## 2020-04-26 DIAGNOSIS — N189 Chronic kidney disease, unspecified: Secondary | ICD-10-CM | POA: Diagnosis not present

## 2020-05-01 ENCOUNTER — Ambulatory Visit (INDEPENDENT_AMBULATORY_CARE_PROVIDER_SITE_OTHER): Payer: Medicare Other | Admitting: Family Medicine

## 2020-05-01 ENCOUNTER — Encounter: Payer: Self-pay | Admitting: Family Medicine

## 2020-05-01 DIAGNOSIS — J0110 Acute frontal sinusitis, unspecified: Secondary | ICD-10-CM

## 2020-05-01 MED ORDER — AMOXICILLIN-POT CLAVULANATE 875-125 MG PO TABS: 1.0000 | ORAL_TABLET | Freq: Two times a day (BID) | ORAL | 0 refills | Status: DC

## 2020-05-01 MED ORDER — FLUTICASONE PROPIONATE 50 MCG/ACT NA SUSP: 1.0000 | Freq: Two times a day (BID) | NASAL | 6 refills | Status: DC | PRN

## 2020-05-01 MED ORDER — BENZONATATE 100 MG PO CAPS: 100.0000 mg | ORAL_CAPSULE | Freq: Two times a day (BID) | ORAL | 0 refills | Status: DC | PRN

## 2020-05-01 NOTE — Progress Notes (Signed)
Virtual Visit via telephone Note  I connected with Austin Weber on 05/01/20 at 1240 by telephone and verified that I am speaking with the correct person using two identifiers. Austin Weber is currently located at home and patient are currently with her during visit. The provider, Fransisca Kaufmann Radie Berges, MD is located in their office at time of visit.  Call ended at 1246  I discussed the limitations, risks, security and privacy concerns of performing an evaluation and management service by telephone and the availability of in person appointments. I also discussed with the patient that there may be a patient responsible charge related to this service. The patient expressed understanding and agreed to proceed.   History and Present Illness: Patient is calling in for head cold.  This started with a sore throat about 10 days ago.  He had phlegm and cough and sinus pressure.  He had pressure in his left ear.  He denies fevers but did have chills. He denies shortness of breath.  He has most pressure under his eyes. He denies sick contacts that he knows of.   No diagnosis found.  Outpatient Encounter Medications as of 05/01/2020  Medication Sig  . amLODipine (NORVASC) 10 MG tablet Take 1 tablet (10 mg total) by mouth daily.  Marland Kitchen aspirin EC 81 MG tablet Take 81 mg by mouth 2 (two) times daily.  Marland Kitchen atorvastatin (LIPITOR) 20 MG tablet Take 1 tablet (20 mg total) by mouth daily.  . carvedilol (COREG) 6.25 MG tablet TAKE ONE (1) TABLET THREE (3) TIMES EACH DAY  . glipiZIDE (GLUCOTROL XL) 10 MG 24 hr tablet Take 1 tablet (10 mg total) by mouth 2 (two) times daily.  Marland Kitchen losartan (COZAAR) 100 MG tablet Take 1 tablet (100 mg total) by mouth daily.  . metFORMIN (GLUCOPHAGE-XR) 500 MG 24 hr tablet Take 1 tablet (500 mg total) by mouth 2 (two) times daily.  . repaglinide (PRANDIN) 2 MG tablet TAKE ONE TABLET BY MOUTH THREE TIMES A DAY BEFORE MEALS  . tadalafil (CIALIS) 5 MG tablet TAKE ONE (1) TABLET EACH DAY  .  terazosin (HYTRIN) 2 MG capsule Take 1 capsule (2 mg total) by mouth at bedtime.   No facility-administered encounter medications on file as of 05/01/2020.    Review of Systems  Constitutional: Negative for chills and fever.  HENT: Positive for congestion, ear pain and sinus pressure. Negative for ear discharge, sinus pain, sneezing, sore throat and voice change.   Respiratory: Positive for cough. Negative for shortness of breath and wheezing.   Cardiovascular: Negative for chest pain and leg swelling.  Musculoskeletal: Negative for back pain and gait problem.  Skin: Negative for rash.  All other systems reviewed and are negative.   Observations/Objective: Patient sounds comfortable and in no acute distress  Assessment and Plan: Problem List Items Addressed This Visit   None   Visit Diagnoses    Acute non-recurrent frontal sinusitis    -  Primary   Relevant Medications   amoxicillin-clavulanate (AUGMENTIN) 875-125 MG tablet   fluticasone (FLONASE) 50 MCG/ACT nasal spray   benzonatate (TESSALON) 100 MG capsule      Sounds like small sinus infection, will treat as such   Follow up plan: Return if symptoms worsen or fail to improve.     I discussed the assessment and treatment plan with the patient. The patient was provided an opportunity to ask questions and all were answered. The patient agreed with the plan and demonstrated an understanding of the instructions.  The patient was advised to call back or seek an in-person evaluation if the symptoms worsen or if the condition fails to improve as anticipated.  The above assessment and management plan was discussed with the patient. The patient verbalized understanding of and has agreed to the management plan. Patient is aware to call the clinic if symptoms persist or worsen. Patient is aware when to return to the clinic for a follow-up visit. Patient educated on when it is appropriate to go to the emergency department.    I  provided 6 minutes of non-face-to-face time during this encounter.    Worthy Rancher, MD

## 2020-05-03 DIAGNOSIS — E211 Secondary hyperparathyroidism, not elsewhere classified: Secondary | ICD-10-CM | POA: Diagnosis not present

## 2020-05-03 DIAGNOSIS — D638 Anemia in other chronic diseases classified elsewhere: Secondary | ICD-10-CM | POA: Diagnosis not present

## 2020-05-03 DIAGNOSIS — R808 Other proteinuria: Secondary | ICD-10-CM | POA: Diagnosis not present

## 2020-05-03 DIAGNOSIS — E1122 Type 2 diabetes mellitus with diabetic chronic kidney disease: Secondary | ICD-10-CM | POA: Diagnosis not present

## 2020-05-03 DIAGNOSIS — E559 Vitamin D deficiency, unspecified: Secondary | ICD-10-CM | POA: Diagnosis not present

## 2020-05-03 DIAGNOSIS — I5032 Chronic diastolic (congestive) heart failure: Secondary | ICD-10-CM | POA: Diagnosis not present

## 2020-05-03 DIAGNOSIS — N189 Chronic kidney disease, unspecified: Secondary | ICD-10-CM | POA: Diagnosis not present

## 2020-06-28 ENCOUNTER — Ambulatory Visit (INDEPENDENT_AMBULATORY_CARE_PROVIDER_SITE_OTHER): Payer: Medicare Other | Admitting: Family Medicine

## 2020-06-28 ENCOUNTER — Encounter: Payer: Self-pay | Admitting: Family Medicine

## 2020-06-28 ENCOUNTER — Other Ambulatory Visit: Payer: Self-pay

## 2020-06-28 VITALS — BP 148/69 | HR 80 | Ht 73.0 in | Wt 198.0 lb

## 2020-06-28 DIAGNOSIS — E1169 Type 2 diabetes mellitus with other specified complication: Secondary | ICD-10-CM | POA: Diagnosis not present

## 2020-06-28 DIAGNOSIS — I152 Hypertension secondary to endocrine disorders: Secondary | ICD-10-CM | POA: Diagnosis not present

## 2020-06-28 DIAGNOSIS — E1122 Type 2 diabetes mellitus with diabetic chronic kidney disease: Secondary | ICD-10-CM

## 2020-06-28 DIAGNOSIS — E785 Hyperlipidemia, unspecified: Secondary | ICD-10-CM

## 2020-06-28 DIAGNOSIS — N183 Chronic kidney disease, stage 3 unspecified: Secondary | ICD-10-CM

## 2020-06-28 DIAGNOSIS — E1159 Type 2 diabetes mellitus with other circulatory complications: Secondary | ICD-10-CM

## 2020-06-28 LAB — BAYER DCA HB A1C WAIVED: HB A1C (BAYER DCA - WAIVED): 7.1 % — ABNORMAL HIGH (ref <7.0)

## 2020-06-28 NOTE — Progress Notes (Signed)
BP (!) 148/69   Pulse 80   Ht '6\' 1"'$  (1.854 m)   Wt 198 lb (89.8 kg)   SpO2 98%   BMI 26.12 kg/m    Subjective:   Patient ID: Austin Weber, male    DOB: 02-01-1950, 70 y.o.   MRN: [id number]  HPI: Austin Weber is a 70 y.o. male presenting on 06/28/2020 for Medical Management of Chronic Issues, Diabetes, and Hypertension   HPI Type 2 diabetes mellitus Patient comes in today for recheck of his diabetes. Patient has been currently taking glipizide and Prandin and metformin, A1c 7.1 which is improved from last time. Patient is currently on an ACE inhibitor/ARB. Patient has not seen an ophthalmologist this year. Patient denies any issues with their feet. The symptom started onset as an adult hypertension and hyperlipidemia ARE RELATED TO DM   .Hypertension Patient is currently on carvedilol and amlodipine and losartan and terazosin, and their blood pressure today is 148/69. Patient denies any lightheadedness or dizziness. Patient denies headaches, blurred vision, chest pains, shortness of breath, or weakness. Denies any side effects from medication and is content with current medication.    Hyperlipidemia Patient is coming in for recheck of his hyperlipidemia. The patient is currently taking atorvastatin. They deny any issues with myalgias or history of liver damage from it. They deny any focal numbness or weakness or chest pain.   Relevant past medical, surgical, family and social history reviewed and updated as indicated. Interim medical history since our last visit reviewed. Allergies and medications reviewed and updated.  Review of Systems  Constitutional:  Negative for chills and fever.  Eyes:  Negative for visual disturbance.  Respiratory:  Negative for shortness of breath and wheezing.   Cardiovascular:  Negative for chest pain and leg swelling.  Musculoskeletal:  Negative for back pain and gait problem.  Skin:  Negative for rash.  Neurological:  Negative for dizziness,  weakness and light-headedness.  All other systems reviewed and are negative.  Per HPI unless specifically indicated above   Allergies as of 06/28/2020   No Known Allergies      Medication List        Accurate as of June 28, 2020  9:10 AM. If you have any questions, ask your nurse or doctor.          STOP taking these medications    amoxicillin-clavulanate 875-125 MG tablet Commonly known as: AUGMENTIN Stopped by: Fransisca Kaufmann Dianna Ewald, MD   benzonatate 100 MG capsule Commonly known as: TESSALON Stopped by: Fransisca Kaufmann Catheline Hixon, MD       TAKE these medications    amLODipine 10 MG tablet Commonly known as: NORVASC Take 1 tablet (10 mg total) by mouth daily.   aspirin EC 81 MG tablet Take 81 mg by mouth 2 (two) times daily.   atorvastatin 20 MG tablet Commonly known as: LIPITOR Take 1 tablet (20 mg total) by mouth daily.   carvedilol 6.25 MG tablet Commonly known as: COREG TAKE ONE (1) TABLET THREE (3) TIMES EACH DAY   fluticasone 50 MCG/ACT nasal spray Commonly known as: FLONASE Place 1 spray into both nostrils 2 (two) times daily as needed for allergies or rhinitis.   glipiZIDE 10 MG 24 hr tablet Commonly known as: GLUCOTROL XL Take 1 tablet (10 mg total) by mouth 2 (two) times daily.   losartan 100 MG tablet Commonly known as: COZAAR Take 1 tablet (100 mg total) by mouth daily.   metFORMIN 500 MG 24 hr tablet Commonly  known as: GLUCOPHAGE-XR Take 1 tablet (500 mg total) by mouth 2 (two) times daily.   repaglinide 2 MG tablet Commonly known as: PRANDIN TAKE ONE TABLET BY MOUTH THREE TIMES A DAY BEFORE MEALS   tadalafil 5 MG tablet Commonly known as: CIALIS TAKE ONE (1) TABLET EACH DAY   terazosin 2 MG capsule Commonly known as: HYTRIN Take 1 capsule (2 mg total) by mouth at bedtime.         Objective:   BP (!) 148/69   Pulse 80   Ht '6\' 1"'$  (1.854 m)   Wt 198 lb (89.8 kg)   SpO2 98%   BMI 26.12 kg/m   Wt Readings from Last 3  Encounters:  06/28/20 198 lb (89.8 kg)  04/20/20 200 lb (90.7 kg)  04/05/20 200 lb 9.6 oz (91 kg)    Physical Exam Vitals and nursing note reviewed.  Constitutional:      General: He is not in acute distress.    Appearance: He is well-developed. He is not diaphoretic.  Eyes:     General: No scleral icterus.    Conjunctiva/sclera: Conjunctivae normal.  Neck:     Thyroid: No thyromegaly.  Cardiovascular:     Rate and Rhythm: Normal rate and regular rhythm.     Heart sounds: Normal heart sounds. No murmur heard. Pulmonary:     Effort: Pulmonary effort is normal. No respiratory distress.     Breath sounds: Normal breath sounds. No wheezing.  Musculoskeletal:        General: Normal range of motion.     Cervical back: Neck supple.  Lymphadenopathy:     Cervical: No cervical adenopathy.  Skin:    General: Skin is warm and dry.     Findings: No rash.  Neurological:     Mental Status: He is alert and oriented to person, place, and time.     Coordination: Coordination normal.  Psychiatric:        Behavior: Behavior normal.      Assessment & Plan:   Problem List Items Addressed This Visit       Cardiovascular and Mediastinum   Hypertension associated with diabetes ([place])     Endocrine   Type 2 diabetes mellitus with other specified complication ([place]) - Primary   Relevant Orders   Bayer DCA Hb A1c Waived   Hyperlipidemia associated with type 2 diabetes mellitus ([place])   CKD stage 3 due to type 2 diabetes mellitus ([place])    Continue current medication, A1c looks good, no changes.  Continue to follow with nephrology for the kidneys. Follow up plan: Return in about 3 months (around 09/28/2020), or if symptoms worsen or fail to improve, for Diabetes hypertension and cholesterol.  Counseling provided for all of the vaccine components Orders Placed This Encounter  Procedures   Bayer [place] Hb A1c Cherry Gissella Niblack, MD [facility] 06/28/2020, 9:10  AM

## 2020-07-28 ENCOUNTER — Other Ambulatory Visit: Payer: Self-pay | Admitting: Family Medicine

## 2020-07-28 DIAGNOSIS — E1169 Type 2 diabetes mellitus with other specified complication: Secondary | ICD-10-CM

## 2020-08-15 ENCOUNTER — Other Ambulatory Visit: Payer: Self-pay | Admitting: Family Medicine

## 2020-10-02 DIAGNOSIS — H40033 Anatomical narrow angle, bilateral: Secondary | ICD-10-CM | POA: Diagnosis not present

## 2020-10-02 DIAGNOSIS — E119 Type 2 diabetes mellitus without complications: Secondary | ICD-10-CM | POA: Diagnosis not present

## 2020-10-09 ENCOUNTER — Ambulatory Visit (INDEPENDENT_AMBULATORY_CARE_PROVIDER_SITE_OTHER): Payer: Medicare Other | Admitting: Family Medicine

## 2020-10-09 ENCOUNTER — Other Ambulatory Visit: Payer: Self-pay

## 2020-10-09 ENCOUNTER — Encounter: Payer: Self-pay | Admitting: Family Medicine

## 2020-10-09 VITALS — BP 132/71 | HR 81 | Ht 73.0 in | Wt 199.0 lb

## 2020-10-09 DIAGNOSIS — E1169 Type 2 diabetes mellitus with other specified complication: Secondary | ICD-10-CM | POA: Diagnosis not present

## 2020-10-09 DIAGNOSIS — N183 Chronic kidney disease, stage 3 unspecified: Secondary | ICD-10-CM

## 2020-10-09 DIAGNOSIS — E785 Hyperlipidemia, unspecified: Secondary | ICD-10-CM

## 2020-10-09 DIAGNOSIS — I152 Hypertension secondary to endocrine disorders: Secondary | ICD-10-CM

## 2020-10-09 DIAGNOSIS — Z23 Encounter for immunization: Secondary | ICD-10-CM | POA: Diagnosis not present

## 2020-10-09 DIAGNOSIS — E1159 Type 2 diabetes mellitus with other circulatory complications: Secondary | ICD-10-CM

## 2020-10-09 DIAGNOSIS — E1122 Type 2 diabetes mellitus with diabetic chronic kidney disease: Secondary | ICD-10-CM | POA: Diagnosis not present

## 2020-10-09 LAB — BAYER DCA HB A1C WAIVED: HB A1C (BAYER DCA - WAIVED): 6.4 % — ABNORMAL HIGH (ref 4.8–5.6)

## 2020-10-09 NOTE — Progress Notes (Signed)
BP 132/71   Pulse 81   Ht _0  (1.854 m)   Wt 199 lb (90.3 kg)   SpO2 98%   BMI 26.25 kg/m    Subjective:   Patient ID: Austin Weber, male    DOB: [birth year], 70 y.o.   MRN: [id number]  HPI: Austin Weber is a 70 y.o. male presenting on 10/09/2020 for Medical Management of Chronic Issues, Diabetes, Hyperlipidemia, and Hypertension   HPI Type 2 diabetes mellitus Patient comes in today for recheck of his diabetes. Patient has been currently taking glipizide and metformin and Prandin, A1c looks good at 6.4.. Patient is currently on an ACE inhibitor/ARB. Patient has seen an ophthalmologist this year. Patient denies any issues with their feet. The symptom started onset as an adult hypertension and hyperlipidemia and CKD 3 ARE RELATED TO DM   Hypertension Patient is currently on amlodipine and carvedilol and losartan and terazosin, and their blood pressure today is 165/80, recheck is 132/71. Patient denies any lightheadedness or dizziness. Patient denies headaches, blurred vision, chest pains, shortness of breath, or weakness. Denies any side effects from medication and is content with current medication.   Hyperlipidemia Patient is coming in for recheck of his hyperlipidemia. The patient is currently taking atorvastatin. They deny any issues with myalgias or history of liver damage from it. They deny any focal numbness or weakness or chest pain.   Relevant past medical, surgical, family and social history reviewed and updated as indicated. Interim medical history since our last visit reviewed. Allergies and medications reviewed and updated.  Review of Systems  Constitutional:  Negative for chills and fever.  Respiratory:  Negative for shortness of breath and wheezing.   Cardiovascular:  Negative for chest pain and leg swelling.  Musculoskeletal:  Negative for back pain and gait problem.  Skin:  Negative for rash.  Neurological:  Negative for dizziness, weakness and  light-headedness.  All other systems reviewed and are negative.  Per HPI unless specifically indicated above   Allergies as of 10/09/2020   No Known Allergies      Medication List        Accurate as of October 09, 2020  9:04 AM. If you have any questions, ask your nurse or doctor.          amLODipine 10 MG tablet Commonly known as: NORVASC Take 1 tablet (10 mg total) by mouth daily.   aspirin EC 81 MG tablet Take 81 mg by mouth 2 (two) times daily.   atorvastatin 20 MG tablet Commonly known as: LIPITOR Take 1 tablet (20 mg total) by mouth daily.   carvedilol 6.25 MG tablet Commonly known as: COREG TAKE ONE (1) TABLET THREE (3) TIMES EACH DAY   fluticasone 50 MCG/ACT nasal spray Commonly known as: FLONASE Place 1 spray into both nostrils 2 (two) times daily as needed for allergies or rhinitis.   glipiZIDE 10 MG 24 hr tablet Commonly known as: GLUCOTROL XL Take 1 tablet (10 mg total) by mouth 2 (two) times daily.   losartan 100 MG tablet Commonly known as: COZAAR Take 1 tablet (100 mg total) by mouth daily.   metFORMIN 500 MG 24 hr tablet Commonly known as: GLUCOPHAGE-XR Take 1 tablet (500 mg total) by mouth 2 (two) times daily.   repaglinide 2 MG tablet Commonly known as: PRANDIN TAKE ONE TABLET BY MOUTH THREE TIMES A DAY BEFORE MEALS   tadalafil 5 MG tablet Commonly known as: CIALIS TAKE ONE (1) TABLET EACH DAY   terazosin  2 MG capsule Commonly known as: HYTRIN Take 1 capsule (2 mg total) by mouth at bedtime.         Objective:   BP 132/71   Pulse 81   Ht _0  (1.854 m)   Wt 199 lb (90.3 kg)   SpO2 98%   BMI 26.25 kg/m   Wt Readings from Last 3 Encounters:  10/09/20 199 lb (90.3 kg)  06/28/20 198 lb (89.8 kg)  04/20/20 200 lb (90.7 kg)    Physical Exam Vitals and nursing note reviewed.  Constitutional:      General: He is not in acute distress.    Appearance: He is well-developed. He is not diaphoretic.  Eyes:     General: No  scleral icterus.    Conjunctiva/sclera: Conjunctivae normal.  Neck:     Thyroid: No thyromegaly.  Cardiovascular:     Rate and Rhythm: Normal rate and regular rhythm.     Heart sounds: Normal heart sounds. No murmur heard. Pulmonary:     Effort: Pulmonary effort is normal. No respiratory distress.     Breath sounds: Normal breath sounds. No wheezing.  Musculoskeletal:        General: No swelling. Normal range of motion.     Cervical back: Neck supple.  Lymphadenopathy:     Cervical: No cervical adenopathy.  Skin:    General: Skin is warm and dry.     Findings: No rash.  Neurological:     Mental Status: He is alert and oriented to person, place, and time.     Coordination: Coordination normal.  Psychiatric:        Behavior: Behavior normal.      Assessment & Plan:   Problem List Items Addressed This Visit       Cardiovascular and Mediastinum   Hypertension associated with diabetes ([place])   Relevant Orders   CBC with Differential/Platelet   CMP14+EGFR   Lipid panel   PSA, total and free   Bayer DCA Hb A1c Waived     Endocrine   Type 2 diabetes mellitus with other specified complication ([place]) - Primary   Relevant Orders   CBC with Differential/Platelet   CMP14+EGFR   Lipid panel   PSA, total and free   Bayer DCA Hb A1c Waived   Hyperlipidemia associated with type 2 diabetes mellitus ([facility])   Relevant Orders   CBC with Differential/Platelet   CMP14+EGFR   Lipid panel   PSA, total and free   Bayer DCA Hb A1c Waived   CKD stage 3 due to type 2 diabetes mellitus ([facility])    A1c looks good at 6.4.  Blood pressure slightly elevated, will monitor closely, has been good in the past.  Recheck was good at 132/71 Follow up plan: Return in about 4 months (around 02/09/2021), or if symptoms worsen or fail to improve, for  diabetes and hypertension and cholesterol.  Counseling provided for all of the vaccine components Orders Placed This Encounter  Procedures   CBC with  Differential/Platelet   CMP14+EGFR   Lipid panel   PSA, total and free   Bayer DCA Hb A1c Waived    Caryl Pina, MD [facility] 10/09/2020, 9:04 AM

## 2020-10-10 LAB — PSA, TOTAL AND FREE
PSA, Free Pct: 43.8 %
PSA, Free: 0.35 ng/mL
Prostate Specific Ag, Serum: 0.8 ng/mL (ref 0.0–4.0)

## 2020-10-10 LAB — CBC WITH DIFFERENTIAL/PLATELET
Basophils Absolute: 0.1 10*3/uL (ref 0.0–0.2)
Basos: 1 %
EOS (ABSOLUTE): 0.2 10*3/uL (ref 0.0–0.4)
Eos: 3 %
Hematocrit: 40.9 % (ref 37.5–51.0)
Hemoglobin: 13.8 g/dL (ref 13.0–17.7)
Immature Grans (Abs): 0 10*3/uL (ref 0.0–0.1)
Immature Granulocytes: 0 %
Lymphocytes Absolute: 1.9 10*3/uL (ref 0.7–3.1)
Lymphs: 26 %
MCH: 31.4 pg (ref 26.6–33.0)
MCHC: 33.7 g/dL (ref 31.5–35.7)
MCV: 93 fL (ref 79–97)
Monocytes Absolute: 0.6 10*3/uL (ref 0.1–0.9)
Monocytes: 8 %
Neutrophils Absolute: 4.6 10*3/uL (ref 1.4–7.0)
Neutrophils: 62 %
Platelets: 167 10*3/uL (ref 150–450)
RBC: 4.4 x10E6/uL (ref 4.14–5.80)
RDW: 12.2 % (ref 11.6–15.4)
WBC: 7.4 10*3/uL (ref 3.4–10.8)

## 2020-10-10 LAB — CMP14+EGFR
ALT: 18 IU/L (ref 0–44)
AST: 15 IU/L (ref 0–40)
Albumin/Globulin Ratio: 1.7 (ref 1.2–2.2)
Albumin: 4 g/dL (ref 3.7–4.7)
Alkaline Phosphatase: 72 IU/L (ref 44–121)
BUN/Creatinine Ratio: 23 (ref 10–24)
BUN: 44 mg/dL — ABNORMAL HIGH (ref 8–27)
Bilirubin Total: 0.4 mg/dL (ref 0.0–1.2)
CO2: 18 mmol/L — ABNORMAL LOW (ref 20–29)
Calcium: 9.4 mg/dL (ref 8.6–10.2)
Chloride: 109 mmol/L — ABNORMAL HIGH (ref 96–106)
Creatinine, Ser: 1.9 mg/dL — ABNORMAL HIGH (ref 0.76–1.27)
Globulin, Total: 2.4 g/dL (ref 1.5–4.5)
Glucose: 135 mg/dL — ABNORMAL HIGH (ref 70–99)
Potassium: 5.3 mmol/L — ABNORMAL HIGH (ref 3.5–5.2)
Sodium: 143 mmol/L (ref 134–144)
Total Protein: 6.4 g/dL (ref 6.0–8.5)
eGFR: 37 mL/min/{1.73_m2} — ABNORMAL LOW (ref >59)

## 2020-10-10 LAB — LIPID PANEL
Chol/HDL Ratio: 3.2 ratio (ref 0.0–5.0)
Cholesterol, Total: 126 mg/dL (ref 100–199)
HDL: 40 mg/dL (ref >39)
LDL Chol Calc (NIH): 64 mg/dL (ref 0–99)
Triglycerides: 123 mg/dL (ref 0–149)
VLDL Cholesterol Cal: 22 mg/dL (ref 5–40)

## 2020-11-21 ENCOUNTER — Other Ambulatory Visit: Payer: Self-pay | Admitting: Family Medicine

## 2020-11-21 DIAGNOSIS — E1169 Type 2 diabetes mellitus with other specified complication: Secondary | ICD-10-CM

## 2020-12-15 ENCOUNTER — Other Ambulatory Visit: Payer: Self-pay | Admitting: Family Medicine

## 2020-12-15 DIAGNOSIS — E1159 Type 2 diabetes mellitus with other circulatory complications: Secondary | ICD-10-CM

## 2020-12-27 ENCOUNTER — Other Ambulatory Visit: Payer: Self-pay | Admitting: Family Medicine

## 2020-12-27 DIAGNOSIS — E1169 Type 2 diabetes mellitus with other specified complication: Secondary | ICD-10-CM

## 2021-02-09 ENCOUNTER — Encounter: Payer: Self-pay | Admitting: Family Medicine

## 2021-02-09 ENCOUNTER — Ambulatory Visit (INDEPENDENT_AMBULATORY_CARE_PROVIDER_SITE_OTHER): Payer: Medicare Other | Admitting: Family Medicine

## 2021-02-09 VITALS — BP 135/79 | HR 91 | Ht 73.0 in | Wt 207.0 lb

## 2021-02-09 DIAGNOSIS — E1169 Type 2 diabetes mellitus with other specified complication: Secondary | ICD-10-CM | POA: Diagnosis not present

## 2021-02-09 DIAGNOSIS — N183 Chronic kidney disease, stage 3 unspecified: Secondary | ICD-10-CM

## 2021-02-09 DIAGNOSIS — E785 Hyperlipidemia, unspecified: Secondary | ICD-10-CM | POA: Diagnosis not present

## 2021-02-09 DIAGNOSIS — E1122 Type 2 diabetes mellitus with diabetic chronic kidney disease: Secondary | ICD-10-CM

## 2021-02-09 DIAGNOSIS — I152 Hypertension secondary to endocrine disorders: Secondary | ICD-10-CM | POA: Diagnosis not present

## 2021-02-09 DIAGNOSIS — E1159 Type 2 diabetes mellitus with other circulatory complications: Secondary | ICD-10-CM | POA: Diagnosis not present

## 2021-02-09 LAB — CBC WITH DIFFERENTIAL/PLATELET
Basophils Absolute: 0.1 10*3/uL (ref 0.0–0.2)
Basos: 1 %
EOS (ABSOLUTE): 0.2 10*3/uL (ref 0.0–0.4)
Eos: 3 %
Hematocrit: 40 % (ref 37.5–51.0)
Hemoglobin: 14 g/dL (ref 13.0–17.7)
Immature Grans (Abs): 0 10*3/uL (ref 0.0–0.1)
Immature Granulocytes: 0 %
Lymphocytes Absolute: 1.7 10*3/uL (ref 0.7–3.1)
Lymphs: 24 %
MCH: 32.3 pg (ref 26.6–33.0)
MCHC: 35 g/dL (ref 31.5–35.7)
MCV: 92 fL (ref 79–97)
Monocytes Absolute: 0.6 10*3/uL (ref 0.1–0.9)
Monocytes: 9 %
Neutrophils Absolute: 4.3 10*3/uL (ref 1.4–7.0)
Neutrophils: 63 %
Platelets: 168 10*3/uL (ref 150–450)
RBC: 4.33 x10E6/uL (ref 4.14–5.80)
RDW: 11.4 % — ABNORMAL LOW (ref 11.6–15.4)
WBC: 6.9 10*3/uL (ref 3.4–10.8)

## 2021-02-09 LAB — CMP14+EGFR
ALT: 17 IU/L (ref 0–44)
AST: 16 IU/L (ref 0–40)
Albumin/Globulin Ratio: 2 (ref 1.2–2.2)
Albumin: 4.2 g/dL (ref 3.7–4.7)
Alkaline Phosphatase: 80 IU/L (ref 44–121)
BUN/Creatinine Ratio: 15 (ref 10–24)
BUN: 32 mg/dL — ABNORMAL HIGH (ref 8–27)
Bilirubin Total: 0.3 mg/dL (ref 0.0–1.2)
CO2: 20 mmol/L (ref 20–29)
Calcium: 9.2 mg/dL (ref 8.6–10.2)
Chloride: 107 mmol/L — ABNORMAL HIGH (ref 96–106)
Creatinine, Ser: 2.13 mg/dL — ABNORMAL HIGH (ref 0.76–1.27)
Globulin, Total: 2.1 g/dL (ref 1.5–4.5)
Glucose: 146 mg/dL — ABNORMAL HIGH (ref 70–99)
Potassium: 5.1 mmol/L (ref 3.5–5.2)
Sodium: 140 mmol/L (ref 134–144)
Total Protein: 6.3 g/dL (ref 6.0–8.5)
eGFR: 32 mL/min/{1.73_m2} — ABNORMAL LOW (ref >59)

## 2021-02-09 LAB — LIPID PANEL
Chol/HDL Ratio: 3.1 ratio (ref 0.0–5.0)
Cholesterol, Total: 133 mg/dL (ref 100–199)
HDL: 43 mg/dL (ref >39)
LDL Chol Calc (NIH): 67 mg/dL (ref 0–99)
Triglycerides: 128 mg/dL (ref 0–149)
VLDL Cholesterol Cal: 23 mg/dL (ref 5–40)

## 2021-02-09 LAB — BAYER DCA HB A1C WAIVED: HB A1C (BAYER DCA - WAIVED): 7.7 % — ABNORMAL HIGH (ref 4.8–5.6)

## 2021-02-09 MED ORDER — CARVEDILOL 6.25 MG PO TABS: 6.2500 mg | ORAL_TABLET | Freq: Three times a day (TID) | ORAL | 3 refills | Status: DC

## 2021-02-09 MED ORDER — AMLODIPINE BESYLATE 10 MG PO TABS: 10.0000 mg | ORAL_TABLET | Freq: Every day | ORAL | 3 refills | Status: DC

## 2021-02-09 MED ORDER — ATORVASTATIN CALCIUM 20 MG PO TABS: 20.0000 mg | ORAL_TABLET | Freq: Every day | ORAL | 3 refills | Status: AC

## 2021-02-09 MED ORDER — LOSARTAN POTASSIUM 100 MG PO TABS: 100.0000 mg | ORAL_TABLET | Freq: Every day | ORAL | 3 refills | Status: DC

## 2021-02-09 MED ORDER — GLIPIZIDE ER 10 MG PO TB24: 10.0000 mg | ORAL_TABLET | Freq: Two times a day (BID) | ORAL | 3 refills | Status: DC

## 2021-02-09 MED ORDER — TERAZOSIN HCL 2 MG PO CAPS: 2.0000 mg | ORAL_CAPSULE | Freq: Every day | ORAL | 3 refills | Status: DC

## 2021-02-09 MED ORDER — METFORMIN HCL ER 500 MG PO TB24: 500.0000 mg | ORAL_TABLET | Freq: Two times a day (BID) | ORAL | 3 refills | Status: DC

## 2021-02-09 NOTE — Progress Notes (Signed)
BP 135/79    Pulse 91    Ht '6\' 1"'  (1.854 m)    Wt 207 lb (93.9 kg)    SpO2 99%    BMI 27.31 kg/m    Subjective:   Patient ID: Austin Weber, male    DOB: 1950-06-10, 71 y.o.   MRN: [id number]  HPI: Austin Weber is a 71 y.o. male presenting on 02/09/2021 for Medical Management of Chronic Issues, Diabetes, Hyperlipidemia, Hypertension, and Chronic Kidney Disease   HPI Type 2 diabetes mellitus Patient comes in today for recheck of his diabetes. Patient has been currently taking metformin and glipizide and Prandin. Patient is currently on an ACE inhibitor/ARB. Patient has not seen an ophthalmologist this year. Patient denies any issues with their feet. The symptom started onset as an adult hypertension and hyperlipidemia ARE RELATED TO DM   Hypertension Patient is currently on amlodipine and carvedilol and losartan, and their blood pressure today is 135/79. Patient denies any lightheadedness or dizziness. Patient denies headaches, blurred vision, chest pains, shortness of breath, or weakness. Denies any side effects from medication and is content with current medication.  Hyperlipidemia Patient is coming in for recheck of his hyperlipidemia. The patient is currently taking atorvastatin. They deny any issues with myalgias or history of liver damage from it. They deny any focal numbness or weakness or chest pain.   Relevant past medical, surgical, family and social history reviewed and updated as indicated. Interim medical history since our last visit reviewed. Allergies and medications reviewed and updated.  Review of Systems  Constitutional:  Negative for chills and fever.  Eyes:  Negative for visual disturbance.  Respiratory:  Negative for shortness of breath and wheezing.   Cardiovascular:  Negative for chest pain and leg swelling.  Musculoskeletal:  Negative for back pain and gait problem.  Skin:  Negative for rash.  Neurological:  Negative for dizziness, weakness and numbness.  All  other systems reviewed and are negative.  Per HPI unless specifically indicated above   Allergies as of 02/09/2021   No Known Allergies      Medication List        Accurate as of February 09, 2021  8:31 AM. If you have any questions, ask your nurse or doctor.          amLODipine 10 MG tablet Commonly known as: NORVASC Take 1 tablet (10 mg total) by mouth daily. What changed: how much to take Changed by: Worthy Rancher, MD   aspirin EC 81 MG tablet Take 81 mg by mouth 2 (two) times daily.   atorvastatin 20 MG tablet Commonly known as: LIPITOR Take 1 tablet (20 mg total) by mouth daily. What changed: how much to take Changed by: Fransisca Kaufmann Krzysztof Reichelt, MD   carvedilol 6.25 MG tablet Commonly known as: COREG Take 1 tablet (6.25 mg total) by mouth 3 (three) times daily. What changed:  how much to take how to take this when to take this additional instructions Changed by: Fransisca Kaufmann Angelgabriel Willmore, MD   fluticasone 50 MCG/ACT nasal spray Commonly known as: FLONASE Place 1 spray into both nostrils 2 (two) times daily as needed for allergies or rhinitis.   glipiZIDE 10 MG 24 hr tablet Commonly known as: GLUCOTROL XL Take 1 tablet (10 mg total) by mouth 2 (two) times daily.   losartan 100 MG tablet Commonly known as: COZAAR Take 1 tablet (100 mg total) by mouth daily.   metFORMIN 500 MG 24 hr tablet Commonly known as: GLUCOPHAGE-XR  Take 1 tablet (500 mg total) by mouth 2 (two) times daily.   repaglinide 2 MG tablet Commonly known as: PRANDIN TAKE ONE TABLET BY MOUTH THREE TIMES A DAY BEFORE MEALS   tadalafil 5 MG tablet Commonly known as: CIALIS TAKE ONE (1) TABLET EACH DAY   terazosin 2 MG capsule Commonly known as: HYTRIN Take 1 capsule (2 mg total) by mouth at bedtime.         Objective:   BP 135/79    Pulse 91    Ht '6\' 1"'  (1.854 m)    Wt 207 lb (93.9 kg)    SpO2 99%    BMI 27.31 kg/m   Wt Readings from Last 3 Encounters:  02/09/21 207 lb (93.9 kg)   10/09/20 199 lb (90.3 kg)  06/28/20 198 lb (89.8 kg)    Physical Exam Vitals and nursing note reviewed.  Constitutional:      General: He is not in acute distress.    Appearance: He is well-developed. He is not diaphoretic.  Eyes:     General: No scleral icterus.    Conjunctiva/sclera: Conjunctivae normal.  Neck:     Thyroid: No thyromegaly.  Cardiovascular:     Rate and Rhythm: Normal rate and regular rhythm.     Heart sounds: Normal heart sounds. No murmur heard. Pulmonary:     Effort: Pulmonary effort is normal. No respiratory distress.     Breath sounds: Normal breath sounds. No wheezing.  Musculoskeletal:        General: No swelling. Normal range of motion.     Cervical back: Neck supple.  Lymphadenopathy:     Cervical: No cervical adenopathy.  Skin:    General: Skin is warm and dry.     Findings: No rash.  Neurological:     Mental Status: He is alert and oriented to person, place, and time.     Coordination: Coordination normal.  Psychiatric:        Behavior: Behavior normal.      Assessment & Plan:   Problem List Items Addressed This Visit       Cardiovascular and Mediastinum   Hypertension associated with diabetes ([place])   Relevant Medications   carvedilol (COREG) 6.25 MG tablet   amLODipine (NORVASC) 10 MG tablet   atorvastatin (LIPITOR) 20 MG tablet   glipiZIDE (GLUCOTROL XL) 10 MG 24 hr tablet   losartan (COZAAR) 100 MG tablet   metFORMIN (GLUCOPHAGE-XR) 500 MG 24 hr tablet   terazosin (HYTRIN) 2 MG capsule   Other Relevant Orders   CBC with Differential/Platelet   CMP14+EGFR   Lipid panel   Bayer DCA Hb A1c Waived     Endocrine   Type 2 diabetes mellitus with other specified complication ([facility]) - Primary   Relevant Medications   atorvastatin (LIPITOR) 20 MG tablet   glipiZIDE (GLUCOTROL XL) 10 MG 24 hr tablet   losartan (COZAAR) 100 MG tablet   metFORMIN (GLUCOPHAGE-XR) 500 MG 24 hr tablet   Other Relevant Orders   CBC with  Differential/Platelet   CMP14+EGFR   Lipid panel   Bayer DCA Hb A1c Waived   Hyperlipidemia associated with type 2 diabetes mellitus ([facility])   Relevant Medications   carvedilol (COREG) 6.25 MG tablet   amLODipine (NORVASC) 10 MG tablet   atorvastatin (LIPITOR) 20 MG tablet   glipiZIDE (GLUCOTROL XL) 10 MG 24 hr tablet   losartan (COZAAR) 100 MG tablet   metFORMIN (GLUCOPHAGE-XR) 500 MG 24 hr tablet   terazosin (HYTRIN) 2  MG capsule   Other Relevant Orders   CBC with Differential/Platelet   CMP14+EGFR   Lipid panel   Bayer DCA Hb A1c Waived   CKD stage 3 due to type 2 diabetes mellitus ([facility])   Relevant Medications   atorvastatin (LIPITOR) 20 MG tablet   glipiZIDE (GLUCOTROL XL) 10 MG 24 hr tablet   losartan (COZAAR) 100 MG tablet   metFORMIN (GLUCOPHAGE-XR) 500 MG 24 hr tablet   Other Relevant Orders   CBC with Differential/Platelet   CMP14+EGFR   Lipid panel   Bayer DCA Hb A1c Waived    Blood pressure looks great, continue current medicine for that.  Patient does not like to go to nephrology.  A1c still pending.  We will Follow up plan: Return in about 4 months (around 06/09/2021), or if symptoms worsen or fail to improve, for Diabetes hypertension.  Counseling provided for all of the vaccine components Orders Placed This Encounter  Procedures   CBC with Differential/Platelet   CMP14+EGFR   Lipid panel   Bayer DCA Hb A1c Waived    Caryl Pina, MD [place] 02/09/2021, 8:31 AM

## 2021-04-03 ENCOUNTER — Other Ambulatory Visit: Payer: Self-pay | Admitting: Family Medicine

## 2021-04-03 DIAGNOSIS — E1169 Type 2 diabetes mellitus with other specified complication: Secondary | ICD-10-CM

## 2021-04-23 ENCOUNTER — Ambulatory Visit (INDEPENDENT_AMBULATORY_CARE_PROVIDER_SITE_OTHER): Payer: Medicare Other

## 2021-04-23 VITALS — Wt 200.0 lb

## 2021-04-23 DIAGNOSIS — Z599 Problem related to housing and economic circumstances, unspecified: Secondary | ICD-10-CM

## 2021-04-23 DIAGNOSIS — Z0000 Encounter for general adult medical examination without abnormal findings: Secondary | ICD-10-CM | POA: Diagnosis not present

## 2021-04-23 NOTE — Patient Instructions (Signed)
Austin Weber , ?Thank you for taking time to come for your Medicare Wellness Visit. I appreciate your ongoing commitment to your health goals. Please review the following plan we discussed and let me know if I can assist you in the future.  ? ?Screening recommendations/referrals: ?Colonoscopy: Declined ?Recommended yearly ophthalmology/optometry visit for glaucoma screening and checkup ?Recommended yearly dental visit for hygiene and checkup ? ?Vaccinations: ?Influenza vaccine: Done 10/09/2020 - Repeat annually ?Pneumococcal vaccine: Done 02/28/2016 & 03/31/2017 ?Tdap vaccine: Done 05/07/2009 - Repeat in 10 years *due ?Shingles vaccine: Due - Shingrix is 2 doses 2-6 months apart and over 90% effective     ?Covid-19: Done 09/15/2019 & 10/13/2019 - declines boosters ? ?Advanced directives: Advance directive discussed with you today. Even though you declined this today, please call our office should you change your mind, and we can give you the proper paperwork for you to fill out.  ? ?Conditions/risks identified: Aim for 30 minutes of exercise or brisk walking, 6-8 glasses of water, and 5 servings of fruits and vegetables each day.  ? ?Next appointment: Follow up in one year for your annual wellness visit.  ? ?Preventive Care 19 Years and Older, Male ? ?Preventive care refers to lifestyle choices and visits with your health care provider that can promote health and wellness. ?What does preventive care include? ?A yearly physical exam. This is also called an annual well check. ?Dental exams once or twice a year. ?Routine eye exams. Ask your health care provider how often you should have your eyes checked. ?Personal lifestyle choices, including: ?Daily care of your teeth and gums. ?Regular physical activity. ?Eating a healthy diet. ?Avoiding tobacco and drug use. ?Limiting alcohol use. ?Practicing safe sex. ?Taking low doses of aspirin every day. ?Taking vitamin and mineral supplements as recommended by your health care  provider. ?What happens during an annual well check? ?The services and screenings done by your health care provider during your annual well check will depend on your age, overall health, lifestyle risk factors, and family history of disease. ?Counseling  ?Your health care provider may ask you questions about your: ?Alcohol use. ?Tobacco use. ?Drug use. ?Emotional well-being. ?Home and relationship well-being. ?Sexual activity. ?Eating habits. ?History of falls. ?Memory and ability to understand (cognition). ?Work and work Statistician. ?Screening  ?You may have the following tests or measurements: ?Height, weight, and BMI. ?Blood pressure. ?Lipid and cholesterol levels. These may be checked every 5 years, or more frequently if you are over 15 years old. ?Skin check. ?Lung cancer screening. You may have this screening every year starting at age 77 if you have a 30-pack-year history of smoking and currently smoke or have quit within the past 15 years. ?Fecal occult blood test (FOBT) of the stool. You may have this test every year starting at age 46. ?Flexible sigmoidoscopy or colonoscopy. You may have a sigmoidoscopy every 5 years or a colonoscopy every 10 years starting at age 27. ?Prostate cancer screening. Recommendations will vary depending on your family history and other risks. ?Hepatitis C blood test. ?Hepatitis B blood test. ?Sexually transmitted disease (STD) testing. ?Diabetes screening. This is done by checking your blood sugar (glucose) after you have not eaten for a while (fasting). You may have this done every 1-3 years. ?Abdominal aortic aneurysm (AAA) screening. You may need this if you are a current or former smoker. ?Osteoporosis. You may be screened starting at age 37 if you are at high risk. ?Talk with your health care provider about your test  results, treatment options, and if necessary, the need for more tests. ?Vaccines  ?Your health care provider may recommend certain vaccines, such  as: ?Influenza vaccine. This is recommended every year. ?Tetanus, diphtheria, and acellular pertussis (Tdap, Td) vaccine. You may need a Td booster every 10 years. ?Zoster vaccine. You may need this after age 67. ?Pneumococcal 13-valent conjugate (PCV13) vaccine. One dose is recommended after age 63. ?Pneumococcal polysaccharide (PPSV23) vaccine. One dose is recommended after age 41. ?Talk to your health care provider about which screenings and vaccines you need and how often you need them. ?This information is not intended to replace advice given to you by your health care provider. Make sure you discuss any questions you have with your health care provider. ?Document Released: 01/20/2015 Document Revised: 09/13/2015 Document Reviewed: 10/25/2014 ?Elsevier Interactive Patient Education ? 2017 [place]. ? ?Fall Prevention in the Home ?Falls can cause injuries. They can happen to people of all ages. There are many things you can do to make your home safe and to help prevent falls. ?What can I do on the outside of my home? ?Regularly fix the edges of walkways and driveways and fix any cracks. ?Remove anything that might make you trip as you walk through a door, such as a raised step or threshold. ?Trim any bushes or trees on the path to your home. ?Use bright outdoor lighting. ?Clear any walking paths of anything that might make someone trip, such as rocks or tools. ?Regularly check to see if handrails are loose or broken. Make sure that both sides of any steps have handrails. ?Any raised decks and porches should have guardrails on the edges. ?Have any leaves, snow, or ice cleared regularly. ?Use sand or salt on walking paths during winter. ?Clean up any spills in your garage right away. This includes oil or grease spills. ?What can I do in the bathroom? ?Use night lights. ?Install grab bars by the toilet and in the tub and shower. Do not use towel bars as grab bars. ?Use non-skid mats or decals in the tub or  shower. ?If you need to sit down in the shower, use a plastic, non-slip stool. ?Keep the floor dry. Clean up any water that spills on the floor as soon as it happens. ?Remove soap buildup in the tub or shower regularly. ?Attach bath mats securely with double-sided non-slip rug tape. ?Do not have throw rugs and other things on the floor that can make you trip. ?What can I do in the bedroom? ?Use night lights. ?Make sure that you have a light by your bed that is easy to reach. ?Do not use any sheets or blankets that are too big for your bed. They should not hang down onto the floor. ?Have a firm chair that has side arms. You can use this for support while you get dressed. ?Do not have throw rugs and other things on the floor that can make you trip. ?What can I do in the kitchen? ?Clean up any spills right away. ?Avoid walking on wet floors. ?Keep items that you use a lot in easy-to-reach places. ?If you need to reach something above you, use a strong step stool that has a grab bar. ?Keep electrical cords out of the way. ?Do not use floor polish or wax that makes floors slippery. If you must use wax, use non-skid floor wax. ?Do not have throw rugs and other things on the floor that can make you trip. ?What can I do with my stairs? ?  Do not leave any items on the stairs. ?Make sure that there are handrails on both sides of the stairs and use them. Fix handrails that are broken or loose. Make sure that handrails are as long as the stairways. ?Check any carpeting to make sure that it is firmly attached to the stairs. Fix any carpet that is loose or worn. ?Avoid having throw rugs at the top or bottom of the stairs. If you do have throw rugs, attach them to the floor with carpet tape. ?Make sure that you have a light switch at the top of the stairs and the bottom of the stairs. If you do not have them, ask someone to add them for you. ?What else can I do to help prevent falls? ?Wear shoes that: ?Do not have high heels. ?Have  rubber bottoms. ?Are comfortable and fit you well. ?Are closed at the toe. Do not wear sandals. ?If you use a stepladder: ?Make sure that it is fully opened. Do not climb a closed stepladder. ?Make sure that both sides of t

## 2021-04-23 NOTE — Progress Notes (Signed)
? ?Subjective:  ? Austin Weber is a 71 y.o. male who presents for Medicare Annual/Subsequent preventive examination. ? ?Virtual Visit via Telephone Note ? ?I connected with  Austin Weber on 04/23/21 at  8:15 AM EDT by telephone and verified that I am speaking with the correct person using two identifiers. ? ?Location: ?Patient: HOME ?Provider: [facility] ?Persons participating in the virtual visit: patient/Nurse Health Advisor ?  ?I discussed the limitations, risks, security and privacy concerns of performing an evaluation and management service by telephone and the availability of in person appointments. The patient expressed understanding and agreed to proceed. ? ?Interactive audio and video telecommunications were attempted between this nurse and patient, however failed, due to patient having technical difficulties OR patient did not have access to video capability.  We continued and completed visit with audio only. ? ?Some vital signs may be absent or patient reported.  ? ?Austin Ballo Dionne Ano, LPN  ? ?Review of Systems    ? ?Cardiac Risk Factors include: advanced age (>72men, >60 women);sedentary lifestyle;diabetes mellitus;dyslipidemia;hypertension;male gender;smoking/ tobacco exposure ? ?   ?Objective:  ?  ?Today's Vitals  ? 04/23/21 0819  ?Weight: 200 lb (90.7 kg)  ? ?Body mass index is 26.39 kg/m?. ? ? ?  04/23/2021  ?  8:27 AM 04/20/2020  ?  8:23 AM 04/01/2019  ?  8:32 AM  ?Advanced Directives  ?Does Patient Have a Medical Advance Directive? No Yes No;Yes  ?Type of Corporate treasurer of [place];Living will Living will  ?Does patient want to make changes to medical advance directive?   No - Patient declined  ?Copy of [place] in Chart?  No - copy requested   ?Would patient like information on creating a medical advance directive? No - Patient declined    ? ? ?Current Medications (verified) ?Outpatient Encounter Medications as of 04/23/2021  ?Medication Sig  ? amLODipine (NORVASC)  10 MG tablet Take 1 tablet (10 mg total) by mouth daily.  ? aspirin EC 81 MG tablet Take 81 mg by mouth 2 (two) times daily.  ? atorvastatin (LIPITOR) 20 MG tablet Take 1 tablet (20 mg total) by mouth daily.  ? carvedilol (COREG) 25 MG tablet Take 25 mg by mouth 2 (two) times daily.  ? fluticasone (FLONASE) 50 MCG/ACT nasal spray Place 1 spray into both nostrils 2 (two) times daily as needed for allergies or rhinitis.  ? glipiZIDE (GLUCOTROL XL) 10 MG 24 hr tablet Take 1 tablet (10 mg total) by mouth 2 (two) times daily.  ? losartan (COZAAR) 100 MG tablet Take 1 tablet (100 mg total) by mouth daily.  ? metFORMIN (GLUCOPHAGE-XR) 500 MG 24 hr tablet Take 1 tablet (500 mg total) by mouth 2 (two) times daily.  ? repaglinide (PRANDIN) 2 MG tablet TAKE ONE TABLET BY MOUTH THREE TIMES A DAY BEFORE MEALS  ? tadalafil (CIALIS) 5 MG tablet TAKE ONE (1) TABLET EACH DAY  ? terazosin (HYTRIN) 2 MG capsule Take 1 capsule (2 mg total) by mouth at bedtime. (Patient not taking: Reported on 04/23/2021)  ? [DISCONTINUED] carvedilol (COREG) 6.25 MG tablet Take 1 tablet (6.25 mg total) by mouth 3 (three) times daily.  ? ?No facility-administered encounter medications on file as of 04/23/2021.  ? ? ?Allergies (verified) ?Patient has no known allergies.  ? ?History: ?Past Medical History:  ?Diagnosis Date  ? Chronic kidney disease   ? Diabetes mellitus without complication ([place])   ? Hyperlipidemia   ? Hypertension   ? ?Past Surgical  History:  ?Procedure Laterality Date  ? SHOULDER SURGERY Left   ? ?Family History  ?Problem Relation Age of Onset  ? Heart disease Father   ? ?Social History  ? ?Socioeconomic History  ? Marital status: Legally Separated  ?  Spouse name: Gracyn Santillanes  ? Number of children: 1  ? Years of education: Not on file  ? Highest education level: 12th grade  ?Occupational History  ? Occupation: Retired  ?Tobacco Use  ? Smoking status: Every Day  ?  Packs/day: 0.50  ?  Years: 40.00  ?  Pack years: 20.00  ?  Types:  Cigarettes  ? Smokeless tobacco: Never  ? Tobacco comments:  ?  down from 2 ppd  ?Vaping Use  ? Vaping Use: Never used  ?Substance and Sexual Activity  ? Alcohol use: Never  ? Drug use: Never  ? Sexual activity: Not Currently  ?Other Topics Concern  ? Not on file  ?Social History Narrative  ? Lives alone. Lots of family nearby  ? ?Social Determinants of Health  ? ?Financial Resource Strain: Low Risk   ? Difficulty of Paying Living Expenses: Not hard at all  ?Food Insecurity: No Food Insecurity  ? Worried About Charity fundraiser in the Last Year: Never true  ? Ran Out of Food in the Last Year: Never true  ?Transportation Needs: No Transportation Needs  ? Lack of Transportation (Medical): No  ? Lack of Transportation (Non-Medical): No  ?Physical Activity: Sufficiently Active  ? Days of Exercise per Week: 5 days  ? Minutes of Exercise per Session: 60 min  ?Stress: No Stress Concern Present  ? Feeling of Stress : Not at all  ?Social Connections: Socially Isolated  ? Frequency of Communication with Friends and Family: Twice a week  ? Frequency of Social Gatherings with Friends and Family: Once a week  ? Attends Religious Services: Never  ? Active Member of Clubs or Organizations: No  ? Attends Archivist Meetings: Never  ? Marital Status: Separated  ? ? ?Tobacco Counseling ?Ready to quit: Not Answered ?Counseling given: Not Answered ?Tobacco comments: down from 2 ppd ? ? ?Clinical Intake: ? ?Pre-visit preparation completed: Yes ? ?Pain : No/denies pain ? ?  ? ?BMI - recorded: 26.39 ?Nutritional Status: BMI 25 -29 Overweight ?Nutritional Risks: None ?Diabetes: Yes ?CBG done?: No ?Did pt. bring in CBG monitor from home?: No ? ?How often do you need to have someone help you when you read instructions, pamphlets, or other written materials from your doctor or pharmacy?: 1 - Never ? ?Diabetic? Nutrition Risk Assessment: ? ?Has the patient had any N/V/D within the last 2 months?  No  ?Does the patient have any  non-healing wounds?  No  ?Has the patient had any unintentional weight loss or weight gain?  No  ? ?Diabetes: ? ?Is the patient diabetic?  Yes  ?If diabetic, was a CBG obtained today?  No  ?Did the patient bring in their glucometer from home?  No  ?How often do you monitor your CBG's? ONCE DAILY FASTING - 115 THIS AM PER PATIENT.  ? ?Financial Strains and Diabetes Management: ? ?Are you having any financial strains with the device, your supplies or your medication? Yes .  ?Does the patient want to be seen by Chronic Care Management for management of their diabetes?  Yes  ?Would the patient like to be referred to a Nutritionist or for Diabetic Management?  No  ? ?Diabetic Exams: ? ?Diabetic Eye  Exam: Completed 12/07/2020.  ? ?Diabetic Foot Exam: Completed 10/09/2020. Pt has been advised about the importance in completing this exam.  ? ?Interpreter Needed?: No ? ?Information entered by :: Adria Costley, LPN ? ? ?Activities of Daily Living ? ?  04/23/2021  ?  8:27 AM  ?In your present state of health, do you have any difficulty performing the following activities:  ?Hearing? 0  ?Vision? 0  ?Difficulty concentrating or making decisions? 0  ?Walking or climbing stairs? 0  ?Dressing or bathing? 0  ?Doing errands, shopping? 0  ?Preparing Food and eating ? N  ?Using the Toilet? N  ?In the past six months, have you accidently leaked urine? N  ?Do you have problems with loss of bowel control? N  ?Managing your Medications? N  ?Managing your Finances? N  ?Housekeeping or managing your Housekeeping? N  ? ? ?Patient Care Team: ?Dettinger, Fransisca Kaufmann, MD as PCP - General (Family Medicine) ?Harlen Labs, MD as Referring Physician (Optometry) ?Liana Gerold, MD as Consulting Physician (Nephrology) ?McKenzie, Candee Furbish, MD as Consulting Physician (Urology) ? ?Indicate any recent Medical Services you may have received from other than [facility] providers in the past year (date may be approximate). ? ?   ?Assessment:  ? This is a routine  wellness examination for [place]. ? ?Hearing/Vision screen ?Hearing Screening - Comments:: Denies hearing difficulties   ?Vision Screening - Comments:: Wears rx glasses - up to date with routine eye exams with H

## 2021-04-25 ENCOUNTER — Telehealth: Payer: Self-pay

## 2021-04-25 NOTE — Chronic Care Management (AMB) (Signed)
?  Care Management  ? ?Note ? ?04/25/2021 ?Name: Treyvon Blahut MRN: [id number] DOB: 1950/05/27 ? ?Trent Gabler is a 71 y.o. year old male who is a primary care patient of Dettinger, Fransisca Kaufmann, MD. I reached out to [name] by phone today offer care coordination services.  ? ?Mr. Hansley was given information about care management services today including:  ?Care management services include personalized support from designated clinical staff supervised by his physician, including individualized plan of care and coordination with other care providers ?24/7 contact phone numbers for assistance for urgent and routine care needs. ?The patient may stop care management services at any time by phone call to the office staff. ? ?Patient agreed to services and verbal consent obtained.  ? ?Follow up plan: ?Face to Face appointment with care management team member scheduled for: 05/16/2021 ? ?Noreene Larsson, RMA ?Care Guide, Embedded Care Coordination ?[facility]  ?[place], [place] [address] ?Direct Dial: [phone] ?[email or web address] ?Website: [email or web address]   ?

## 2021-05-11 ENCOUNTER — Other Ambulatory Visit: Payer: Self-pay | Admitting: Family Medicine

## 2021-05-14 ENCOUNTER — Telehealth: Payer: Self-pay | Admitting: *Deleted

## 2021-05-14 NOTE — Chronic Care Management (AMB) (Signed)
Pt called and says his medication cost is $0 and he doesn't need care coordination services at this time.  ?

## 2021-05-16 ENCOUNTER — Ambulatory Visit: Payer: Medicare Other

## 2021-06-11 ENCOUNTER — Ambulatory Visit (INDEPENDENT_AMBULATORY_CARE_PROVIDER_SITE_OTHER): Payer: Medicare Other | Admitting: Family Medicine

## 2021-06-11 ENCOUNTER — Encounter: Payer: Self-pay | Admitting: Family Medicine

## 2021-06-11 VITALS — BP 149/73 | HR 91 | Temp 98.0°F | Ht 73.0 in | Wt 201.0 lb

## 2021-06-11 DIAGNOSIS — I152 Hypertension secondary to endocrine disorders: Secondary | ICD-10-CM

## 2021-06-11 DIAGNOSIS — E1169 Type 2 diabetes mellitus with other specified complication: Secondary | ICD-10-CM | POA: Diagnosis not present

## 2021-06-11 DIAGNOSIS — N183 Chronic kidney disease, stage 3 unspecified: Secondary | ICD-10-CM

## 2021-06-11 DIAGNOSIS — E1159 Type 2 diabetes mellitus with other circulatory complications: Secondary | ICD-10-CM | POA: Diagnosis not present

## 2021-06-11 DIAGNOSIS — E785 Hyperlipidemia, unspecified: Secondary | ICD-10-CM | POA: Diagnosis not present

## 2021-06-11 DIAGNOSIS — E1122 Type 2 diabetes mellitus with diabetic chronic kidney disease: Secondary | ICD-10-CM | POA: Diagnosis not present

## 2021-06-11 DIAGNOSIS — J0110 Acute frontal sinusitis, unspecified: Secondary | ICD-10-CM | POA: Diagnosis not present

## 2021-06-11 LAB — BAYER DCA HB A1C WAIVED: HB A1C (BAYER DCA - WAIVED): 6.9 % — ABNORMAL HIGH (ref 4.8–5.6)

## 2021-06-11 MED ORDER — FLUTICASONE PROPIONATE 50 MCG/ACT NA SUSP: 1.0000 | Freq: Two times a day (BID) | NASAL | 6 refills | Status: AC | PRN

## 2021-06-11 NOTE — Progress Notes (Signed)
BP (!) 149/73   Pulse 91   Temp 98 F (36.7 C)   Ht '6\' 1"'  (1.854 m)   Wt 201 lb (91.2 kg)   SpO2 98%   BMI 26.52 kg/m    Subjective:   Patient ID: Austin Weber, male    DOB: [birth year], 71 y.o.   MRN: [id number]  HPI: Beryle Zeitz is a 71 y.o. male presenting on 06/11/2021 for Medical Management of Chronic Issues, Hypertension, Hyperlipidemia, Diabetes, and Chronic Kidney Disease   HPI Type 2 diabetes mellitus Patient comes in today for recheck of his diabetes. Patient has been currently taking Prandin and metformin and glipizide. Patient is currently on an ACE inhibitor/ARB. Patient has not seen an ophthalmologist this year. Patient denies any issues with their feet. The symptom started onset as an adult hypertension and hyperlipidemia ARE RELATED TO DM   Hypertension Patient is currently on losartan and terazosin and carvedilol and amlodipine, and their blood pressure today is 149/73. Patient denies any lightheadedness or dizziness. Patient denies headaches, blurred vision, chest pains, shortness of breath, or weakness. Denies any side effects from medication and is content with current medication.   Hyperlipidemia Patient is coming in for recheck of his hyperlipidemia. The patient is currently taking atorvastatin. They deny any issues with myalgias or history of liver damage from it. They deny any focal numbness or weakness or chest pain.   Relevant past medical, surgical, family and social history reviewed and updated as indicated. Interim medical history since our last visit reviewed. Allergies and medications reviewed and updated.  Review of Systems  Constitutional:  Negative for chills and fever.  Eyes:  Negative for visual disturbance.  Respiratory:  Negative for shortness of breath and wheezing.   Cardiovascular:  Negative for chest pain and leg swelling.  Musculoskeletal:  Negative for back pain and gait problem.  Skin:  Negative for rash.  Neurological:  Negative  for dizziness, weakness and light-headedness.  All other systems reviewed and are negative.  Per HPI unless specifically indicated above   Allergies as of 06/11/2021   No Known Allergies      Medication List        Accurate as of June 11, 2021  8:21 AM. If you have any questions, ask your nurse or doctor.          amLODipine 10 MG tablet Commonly known as: NORVASC Take 1 tablet (10 mg total) by mouth daily.   aspirin EC 81 MG tablet Take 81 mg by mouth 2 (two) times daily.   atorvastatin 20 MG tablet Commonly known as: LIPITOR Take 1 tablet (20 mg total) by mouth daily.   carvedilol 25 MG tablet Commonly known as: COREG Take 25 mg by mouth 2 (two) times daily.   fluticasone 50 MCG/ACT nasal spray Commonly known as: FLONASE Place 1 spray into both nostrils 2 (two) times daily as needed for allergies or rhinitis.   glipiZIDE 10 MG 24 hr tablet Commonly known as: GLUCOTROL XL Take 1 tablet (10 mg total) by mouth 2 (two) times daily.   losartan 100 MG tablet Commonly known as: COZAAR Take 1 tablet (100 mg total) by mouth daily.   metFORMIN 500 MG 24 hr tablet Commonly known as: GLUCOPHAGE-XR Take 1 tablet (500 mg total) by mouth 2 (two) times daily.   repaglinide 2 MG tablet Commonly known as: PRANDIN TAKE ONE TABLET BY MOUTH THREE TIMES A DAY BEFORE MEALS   tadalafil 5 MG tablet Commonly known as: CIALIS TAKE  ONE (1) TABLET EACH DAY   terazosin 2 MG capsule Commonly known as: HYTRIN Take 1 capsule (2 mg total) by mouth at bedtime.         Objective:   BP (!) 149/73   Pulse 91   Temp 98 F (36.7 C)   Ht '6\' 1"'  (1.854 m)   Wt 201 lb (91.2 kg)   SpO2 98%   BMI 26.52 kg/m   Wt Readings from Last 3 Encounters:  06/11/21 201 lb (91.2 kg)  04/23/21 200 lb (90.7 kg)  02/09/21 207 lb (93.9 kg)    Physical Exam Vitals and nursing note reviewed.  Constitutional:      General: He is not in acute distress.    Appearance: He is well-developed. He is  not diaphoretic.  Eyes:     General: No scleral icterus.    Conjunctiva/sclera: Conjunctivae normal.  Neck:     Thyroid: No thyromegaly.  Cardiovascular:     Rate and Rhythm: Normal rate and regular rhythm.     Heart sounds: Normal heart sounds. No murmur heard. Pulmonary:     Effort: Pulmonary effort is normal. No respiratory distress.     Breath sounds: Normal breath sounds. No wheezing.  Musculoskeletal:        General: Normal range of motion.     Cervical back: Neck supple.  Lymphadenopathy:     Cervical: No cervical adenopathy.  Skin:    General: Skin is warm and dry.     Findings: No rash.  Neurological:     Mental Status: He is alert and oriented to person, place, and time.     Coordination: Coordination normal.  Psychiatric:        Behavior: Behavior normal.      Assessment & Plan:   Problem List Items Addressed This Visit       Cardiovascular and Mediastinum   Hypertension associated with diabetes ([place])   Relevant Orders   Bayer DCA Hb A1c Waived   CBC with Differential/Platelet   CMP14+EGFR   Lipid panel     Endocrine   Type 2 diabetes mellitus with other specified complication (HCC) - Primary   Relevant Orders   Bayer DCA Hb A1c Waived   CBC with Differential/Platelet   CMP14+EGFR   Lipid panel   Hyperlipidemia associated with type 2 diabetes mellitus (HCC)   Relevant Orders   Bayer DCA Hb A1c Waived   CBC with Differential/Platelet   CMP14+EGFR   Lipid panel   CKD stage 3 due to type 2 diabetes mellitus (HCC)   Relevant Orders   Bayer DCA Hb A1c Waived   CBC with Differential/Platelet   CMP14+EGFR   Lipid panel   Other Visit Diagnoses     Acute non-recurrent frontal sinusitis       Relevant Medications   fluticasone (FLONASE) 50 MCG/ACT nasal spray       A1c much improved at 6.9, continue follow-up with with diet and continue current medicine.  Patient declined smoking cessation.  Patient has multiple sun spots and some lesions  with lighter skin, recommended for him to go see dermatologist. Follow up plan: Return in about 3 months (around 09/11/2021), or if symptoms worsen or fail to improve, for Diabetes and hypertension and cholesterol and CKD recheck.  Counseling provided for all of the vaccine components Orders Placed This Encounter  Procedures   Bayer [place] Hb A1c Waived   CBC with Differential/Platelet   CMP14+EGFR   Lipid panel    Vonna Kotyk  Alice Vitelli, MD [facility] 06/11/2021, 8:21 AM

## 2021-06-12 LAB — CMP14+EGFR
ALT: 18 IU/L (ref 0–44)
AST: 15 IU/L (ref 0–40)
Albumin/Globulin Ratio: 1.7 (ref 1.2–2.2)
Albumin: 3.9 g/dL (ref 3.7–4.7)
Alkaline Phosphatase: 77 IU/L (ref 44–121)
BUN/Creatinine Ratio: 16 (ref 10–24)
BUN: 37 mg/dL — ABNORMAL HIGH (ref 8–27)
Bilirubin Total: 0.3 mg/dL (ref 0.0–1.2)
CO2: 16 mmol/L — ABNORMAL LOW (ref 20–29)
Calcium: 9.2 mg/dL (ref 8.6–10.2)
Chloride: 109 mmol/L — ABNORMAL HIGH (ref 96–106)
Creatinine, Ser: 2.26 mg/dL — ABNORMAL HIGH (ref 0.76–1.27)
Globulin, Total: 2.3 g/dL (ref 1.5–4.5)
Glucose: 168 mg/dL — ABNORMAL HIGH (ref 70–99)
Potassium: 5.1 mmol/L (ref 3.5–5.2)
Sodium: 138 mmol/L (ref 134–144)
Total Protein: 6.2 g/dL (ref 6.0–8.5)
eGFR: 30 mL/min/{1.73_m2} — ABNORMAL LOW (ref >59)

## 2021-06-12 LAB — LIPID PANEL
Chol/HDL Ratio: 3.3 ratio (ref 0.0–5.0)
Cholesterol, Total: 135 mg/dL (ref 100–199)
HDL: 41 mg/dL (ref >39)
LDL Chol Calc (NIH): 74 mg/dL (ref 0–99)
Triglycerides: 112 mg/dL (ref 0–149)
VLDL Cholesterol Cal: 20 mg/dL (ref 5–40)

## 2021-06-12 LAB — CBC WITH DIFFERENTIAL/PLATELET
Basophils Absolute: 0.1 10*3/uL (ref 0.0–0.2)
Basos: 1 %
EOS (ABSOLUTE): 0.2 10*3/uL (ref 0.0–0.4)
Eos: 3 %
Hematocrit: 39.8 % (ref 37.5–51.0)
Hemoglobin: 13.7 g/dL (ref 13.0–17.7)
Immature Grans (Abs): 0 10*3/uL (ref 0.0–0.1)
Immature Granulocytes: 0 %
Lymphocytes Absolute: 1.6 10*3/uL (ref 0.7–3.1)
Lymphs: 25 %
MCH: 31.5 pg (ref 26.6–33.0)
MCHC: 34.4 g/dL (ref 31.5–35.7)
MCV: 92 fL (ref 79–97)
Monocytes Absolute: 0.6 10*3/uL (ref 0.1–0.9)
Monocytes: 9 %
Neutrophils Absolute: 3.8 10*3/uL (ref 1.4–7.0)
Neutrophils: 62 %
Platelets: 177 10*3/uL (ref 150–450)
RBC: 4.35 x10E6/uL (ref 4.14–5.80)
RDW: 11.5 % — ABNORMAL LOW (ref 11.6–15.4)
WBC: 6.2 10*3/uL (ref 3.4–10.8)

## 2021-06-19 ENCOUNTER — Other Ambulatory Visit: Payer: Self-pay

## 2021-06-19 DIAGNOSIS — E1122 Type 2 diabetes mellitus with diabetic chronic kidney disease: Secondary | ICD-10-CM

## 2021-07-30 ENCOUNTER — Other Ambulatory Visit: Payer: Self-pay | Admitting: Family Medicine

## 2021-07-30 DIAGNOSIS — E1169 Type 2 diabetes mellitus with other specified complication: Secondary | ICD-10-CM

## 2021-08-08 ENCOUNTER — Encounter: Payer: Self-pay | Admitting: *Deleted

## 2021-08-08 ENCOUNTER — Ambulatory Visit: Payer: Self-pay | Admitting: *Deleted

## 2021-08-08 NOTE — Patient Outreach (Signed)
  Care Coordination   Initial Visit Note   08/08/2021 Name: Austin Weber MRN: [id number] DOB: [birth year]  Austin Weber is a 71 y.o. year old male who sees Dettinger, Fransisca Kaufmann, MD for primary care. I spoke with  Austin Weber by phone today.  What matters to the patients health and wellness today?  No Intervention Identified.    Goals Addressed   None     SDOH assessments and interventions completed:  Yes  SDOH Interventions Today    Flowsheet Row Most Recent Value  SDOH Interventions   Food Insecurity Interventions Intervention Not Indicated  Financial Strain Interventions Intervention Not Indicated  Housing Interventions Intervention Not Indicated  Physical Activity Interventions Intervention Not Indicated  Stress Interventions Intervention Not Indicated  Social Connections Interventions Intervention Not Indicated  Transportation Interventions Intervention Not Indicated        Care Coordination Interventions Activated:  No   Care Coordination Interventions:  No, not indicated.   Follow up plan: No further intervention required.   Encounter Outcome:  Pt. Visit Completed.   Nat Christen, BSW, MSW, LCSW  Licensed Education officer, [facility]  Mailing [place] [address] [place], [place] [address] Physical Address-[address], [place], [place] [address] Toll Free Main # [phone] Fax # [phone] Cell # [phone] [email or web address]

## 2021-08-08 NOTE — Patient Instructions (Signed)
Visit Information  Thank you for taking time to visit with me today. Please don't hesitate to contact me if I can be of assistance to you.   Please call the care guide team at [phone] if you need to cancel or reschedule your appointment.   If you are experiencing a Mental Health or Behavioral Health Crisis or need someone to talk to, please call the Suicide and Crisis Lifeline: 988 call the [place] National Suicide Prevention Lifeline: 1-800-273-8255 or TTY: 1-800-799-4 TTY (1-800-799-4889) to talk to a trained counselor call 1-800-273-TALK (toll free, 24 hour hotline) go to [facility] [address], [place] ([phone]) call the [place] Crisis Line: 800-939-9988 call 911  Patient verbalizes understanding of instructions and care plan provided today and agrees to view in MyChart. Active MyChart status and patient understanding of how to access instructions and care plan via MyChart confirmed with patient.     No further follow up required.  Dmiya Malphrus, BSW, MSW, LCSW  Licensed Clinical Social Worker  [facility] Management [facility]  Mailing Address-[address], [place], [place] [address] Physical Address-[address], , [place] [address] Toll Free Main # [phone] Fax # [phone] Cell # [phone] [email or web address]            

## 2021-09-13 ENCOUNTER — Other Ambulatory Visit: Payer: Self-pay | Admitting: Family Medicine

## 2021-09-13 NOTE — Telephone Encounter (Signed)
Please advise on Cialis  Last OV 06/11/21. Last RF 05/08/21 #30 R-3. Next OV 10/17/21

## 2021-09-21 ENCOUNTER — Telehealth: Payer: Self-pay | Admitting: Family Medicine

## 2021-09-21 NOTE — Telephone Encounter (Signed)
Tell him to cut his glipizide in half and only take a half a tablet of it.

## 2021-09-21 NOTE — Telephone Encounter (Signed)
Informed Dr. Warrick Parisian that pt takes one tab in am and 1 tab in pm. Per Dettinger pt should hold his evening dose over the weekend and call on Monday with an update. Pt understood

## 2021-09-24 NOTE — Progress Notes (Signed)
[facility] Quality Team Note  Name: Austin Weber Date of Birth: 1950-04-10 MRN: [id number] Date: 09/24/2021  [facility] Quality Team has reviewed this patient's chart, please see recommendations below:  [facility] Quality Other; (KED: Kidney Health Evaluation Gap- Patient needs Urine Albumin Creatinine Ratio Test completed for gap closure. EGFR has already been completed, Patient has upcoming appointment with [facility] 10/17/2021.)

## 2021-10-17 ENCOUNTER — Ambulatory Visit (INDEPENDENT_AMBULATORY_CARE_PROVIDER_SITE_OTHER): Payer: Medicare Other | Admitting: Family Medicine

## 2021-10-17 ENCOUNTER — Encounter: Payer: Self-pay | Admitting: Family Medicine

## 2021-10-17 VITALS — BP 132/71 | HR 80 | Temp 98.0°F | Ht 73.0 in | Wt 204.0 lb

## 2021-10-17 DIAGNOSIS — E1159 Type 2 diabetes mellitus with other circulatory complications: Secondary | ICD-10-CM | POA: Diagnosis not present

## 2021-10-17 DIAGNOSIS — Z23 Encounter for immunization: Secondary | ICD-10-CM | POA: Diagnosis not present

## 2021-10-17 DIAGNOSIS — E785 Hyperlipidemia, unspecified: Secondary | ICD-10-CM | POA: Diagnosis not present

## 2021-10-17 DIAGNOSIS — E1122 Type 2 diabetes mellitus with diabetic chronic kidney disease: Secondary | ICD-10-CM | POA: Diagnosis not present

## 2021-10-17 DIAGNOSIS — E1169 Type 2 diabetes mellitus with other specified complication: Secondary | ICD-10-CM

## 2021-10-17 DIAGNOSIS — I152 Hypertension secondary to endocrine disorders: Secondary | ICD-10-CM | POA: Diagnosis not present

## 2021-10-17 DIAGNOSIS — N183 Chronic kidney disease, stage 3 unspecified: Secondary | ICD-10-CM | POA: Diagnosis not present

## 2021-10-17 LAB — BAYER DCA HB A1C WAIVED: HB A1C (BAYER DCA - WAIVED): 6.8 % — ABNORMAL HIGH (ref 4.8–5.6)

## 2021-10-17 NOTE — Progress Notes (Signed)
BP 132/71   Pulse 80   Temp 98 F (36.7 C)   Ht _0  (1.854 m)   Wt 204 lb (92.5 kg)   SpO2 99%   BMI 26.91 kg/m    Subjective:   Patient ID: Austin Weber, male    DOB: 14-Jan-1950, 71 y.o.   MRN: [id number]  HPI: Austin Weber is a 71 y.o. male presenting on 10/17/2021 for Medical Management of Chronic Issues, Hyperlipidemia, Hypertension, Diabetes, and Chronic Kidney Disease   HPI Type 2 diabetes mellitus Patient comes in today for recheck of his diabetes. Patient has been currently taking glipizide, patient's Prandin because he was getting low blood sugars down into the 40s.  He stopped 2 weeks ago.. Patient is currently on an ACE inhibitor/ARB. Patient has not seen an ophthalmologist this year. Patient denies any issues with their feet. The symptom started onset as an adult CKD 3 and hypertension and hyperlipidemia ARE RELATED TO DM   Hypertension Patient is currently on carvedilol and amlodipine and losartan and terazosin, and their blood pressure today is 132/71. Patient denies any lightheadedness or dizziness. Patient denies headaches, blurred vision, chest pains, shortness of breath, or weakness. Denies any side effects from medication and is content with current medication.   Hyperlipidemia Patient is coming in for recheck of his hyperlipidemia. The patient is currently taking atorvastatin. They deny any issues with myalgias or history of liver damage from it. They deny any focal numbness or weakness or chest pain.   Relevant past medical, surgical, family and social history reviewed and updated as indicated. Interim medical history since our last visit reviewed. Allergies and medications reviewed and updated.  Review of Systems  Constitutional:  Negative for chills and fever.  Eyes:  Negative for visual disturbance.  Respiratory:  Negative for shortness of breath and wheezing.   Cardiovascular:  Negative for chest pain and leg swelling.  Musculoskeletal:  Negative  for back pain and gait problem.  Skin:  Negative for rash.  Neurological:  Negative for dizziness, weakness and light-headedness.  All other systems reviewed and are negative.   Per HPI unless specifically indicated above   Allergies as of 10/17/2021   No Known Allergies      Medication List        Accurate as of October 17, 2021  8:25 AM. If you have any questions, ask your nurse or doctor.          STOP taking these medications    metFORMIN 500 MG 24 hr tablet Commonly known as: GLUCOPHAGE-XR Stopped by: Worthy Rancher, MD   repaglinide 2 MG tablet Commonly known as: PRANDIN Stopped by: Fransisca Kaufmann Hania Cerone, MD       TAKE these medications    amLODipine 10 MG tablet Commonly known as: NORVASC Take 1 tablet (10 mg total) by mouth daily.   aspirin EC 81 MG tablet Take 81 mg by mouth 2 (two) times daily.   atorvastatin 20 MG tablet Commonly known as: LIPITOR Take 1 tablet (20 mg total) by mouth daily.   carvedilol 25 MG tablet Commonly known as: COREG Take 25 mg by mouth 2 (two) times daily.   fluticasone 50 MCG/ACT nasal spray Commonly known as: FLONASE Place 1 spray into both nostrils 2 (two) times daily as needed for allergies or rhinitis.   glipiZIDE 10 MG 24 hr tablet Commonly known as: GLUCOTROL XL Take 1 tablet (10 mg total) by mouth 2 (two) times daily.   losartan 100 MG tablet  Commonly known as: COZAAR Take 1 tablet (100 mg total) by mouth daily.   OneTouch Delica Lancets 65H Misc TWICE DAILY   OneTouch Verio Flex System w/Device Kit USE AS DIRECTED   OneTouch Verio test strip Generic drug: glucose blood TWICE DAILY   tadalafil 5 MG tablet Commonly known as: CIALIS TAKE ONE (1) TABLET EACH DAY   terazosin 2 MG capsule Commonly known as: HYTRIN Take 1 capsule (2 mg total) by mouth at bedtime.         Objective:   BP 132/71   Pulse 80   Temp 98 F (36.7 C)   Ht _0  (1.854 m)   Wt 204 lb (92.5 kg)   SpO2 99%    BMI 26.91 kg/m   Wt Readings from Last 3 Encounters:  10/17/21 204 lb (92.5 kg)  06/11/21 201 lb (91.2 kg)  04/23/21 200 lb (90.7 kg)    Physical Exam Vitals and nursing note reviewed.  Constitutional:      General: He is not in acute distress.    Appearance: He is well-developed. He is not diaphoretic.  Eyes:     General: No scleral icterus.    Conjunctiva/sclera: Conjunctivae normal.  Neck:     Thyroid: No thyromegaly.  Cardiovascular:     Rate and Rhythm: Normal rate and regular rhythm.     Heart sounds: Normal heart sounds. No murmur heard. Pulmonary:     Effort: Pulmonary effort is normal. No respiratory distress.     Breath sounds: Normal breath sounds. No wheezing.  Musculoskeletal:        General: No swelling. Normal range of motion.     Cervical back: Neck supple.  Lymphadenopathy:     Cervical: No cervical adenopathy.  Skin:    General: Skin is warm and dry.     Findings: No rash.  Neurological:     Mental Status: He is alert and oriented to person, place, and time.     Coordination: Coordination normal.  Psychiatric:        Behavior: Behavior normal.       Assessment & Plan:   Problem List Items Addressed This Visit       Cardiovascular and Mediastinum   Hypertension associated with diabetes ([place])   Relevant Orders   Microalbumin / creatinine urine ratio     Endocrine   Type 2 diabetes mellitus with other specified complication (HCC) - Primary   Hyperlipidemia associated with type 2 diabetes mellitus ([place])   CKD stage 3 due to type 2 diabetes mellitus ([place])   Relevant Orders   CBC with Differential/Platelet   CMP14+EGFR   Lipid panel   Bayer DCA Hb A1c Waived    For now continue current medicine, he stopped breathing already a couple weeks ago and metformin due to renal disease.  He feels like his blood sugars are good we will see where his A1c is currently still running and he is can have it drawn on the way out. Follow up plan: Return if  symptoms worsen or fail to improve, for Diabetes hypertension cholesterol, 3 to 4 months.  Counseling provided for all of the vaccine components Orders Placed This Encounter  Procedures   CBC with Differential/Platelet   CMP14+EGFR   Lipid panel   Bayer DCA Hb A1c Waived   Microalbumin / creatinine urine ratio    Caryl Pina, MD [facility] 10/17/2021, 8:25 AM

## 2021-10-18 ENCOUNTER — Other Ambulatory Visit: Payer: Self-pay

## 2021-10-18 DIAGNOSIS — E1169 Type 2 diabetes mellitus with other specified complication: Secondary | ICD-10-CM

## 2021-10-18 DIAGNOSIS — E1122 Type 2 diabetes mellitus with diabetic chronic kidney disease: Secondary | ICD-10-CM

## 2021-10-18 DIAGNOSIS — N183 Chronic kidney disease, stage 3 unspecified: Secondary | ICD-10-CM

## 2021-10-18 LAB — LIPID PANEL
Chol/HDL Ratio: 3.3 ratio (ref 0.0–5.0)
Cholesterol, Total: 128 mg/dL (ref 100–199)
HDL: 39 mg/dL — ABNORMAL LOW (ref >39)
LDL Chol Calc (NIH): 73 mg/dL (ref 0–99)
Triglycerides: 82 mg/dL (ref 0–149)
VLDL Cholesterol Cal: 16 mg/dL (ref 5–40)

## 2021-10-18 LAB — CBC WITH DIFFERENTIAL/PLATELET
Basophils Absolute: 0 10*3/uL (ref 0.0–0.2)
Basos: 1 %
EOS (ABSOLUTE): 0.2 10*3/uL (ref 0.0–0.4)
Eos: 3 %
Hematocrit: 36.7 % — ABNORMAL LOW (ref 37.5–51.0)
Hemoglobin: 12.2 g/dL — ABNORMAL LOW (ref 13.0–17.7)
Immature Grans (Abs): 0 10*3/uL (ref 0.0–0.1)
Immature Granulocytes: 0 %
Lymphocytes Absolute: 1.5 10*3/uL (ref 0.7–3.1)
Lymphs: 23 %
MCH: 31 pg (ref 26.6–33.0)
MCHC: 33.2 g/dL (ref 31.5–35.7)
MCV: 93 fL (ref 79–97)
Monocytes Absolute: 0.5 10*3/uL (ref 0.1–0.9)
Monocytes: 8 %
Neutrophils Absolute: 4.2 10*3/uL (ref 1.4–7.0)
Neutrophils: 65 %
Platelets: 161 10*3/uL (ref 150–450)
RBC: 3.93 x10E6/uL — ABNORMAL LOW (ref 4.14–5.80)
RDW: 11.5 % — ABNORMAL LOW (ref 11.6–15.4)
WBC: 6.4 10*3/uL (ref 3.4–10.8)

## 2021-10-18 LAB — CMP14+EGFR
ALT: 38 IU/L (ref 0–44)
AST: 19 IU/L (ref 0–40)
Albumin/Globulin Ratio: 1.7 (ref 1.2–2.2)
Albumin: 3.8 g/dL (ref 3.8–4.8)
Alkaline Phosphatase: 77 IU/L (ref 44–121)
BUN/Creatinine Ratio: 22 (ref 10–24)
BUN: 69 mg/dL — ABNORMAL HIGH (ref 8–27)
Bilirubin Total: 0.2 mg/dL (ref 0.0–1.2)
CO2: 16 mmol/L — ABNORMAL LOW (ref 20–29)
Calcium: 9 mg/dL (ref 8.6–10.2)
Chloride: 109 mmol/L — ABNORMAL HIGH (ref 96–106)
Creatinine, Ser: 3.11 mg/dL (ref 0.76–1.27)
Globulin, Total: 2.2 g/dL (ref 1.5–4.5)
Glucose: 164 mg/dL — ABNORMAL HIGH (ref 70–99)
Potassium: 5.2 mmol/L (ref 3.5–5.2)
Sodium: 138 mmol/L (ref 134–144)
Total Protein: 6 g/dL (ref 6.0–8.5)
eGFR: 20 mL/min/{1.73_m2} — ABNORMAL LOW (ref >59)

## 2021-10-22 DIAGNOSIS — E1122 Type 2 diabetes mellitus with diabetic chronic kidney disease: Secondary | ICD-10-CM | POA: Diagnosis not present

## 2021-10-22 DIAGNOSIS — I160 Hypertensive urgency: Secondary | ICD-10-CM | POA: Diagnosis not present

## 2021-10-22 DIAGNOSIS — N189 Chronic kidney disease, unspecified: Secondary | ICD-10-CM | POA: Diagnosis not present

## 2021-10-22 DIAGNOSIS — R808 Other proteinuria: Secondary | ICD-10-CM | POA: Diagnosis not present

## 2021-10-22 DIAGNOSIS — D638 Anemia in other chronic diseases classified elsewhere: Secondary | ICD-10-CM | POA: Diagnosis not present

## 2021-10-22 DIAGNOSIS — N170 Acute kidney failure with tubular necrosis: Secondary | ICD-10-CM | POA: Diagnosis not present

## 2021-10-22 DIAGNOSIS — I5032 Chronic diastolic (congestive) heart failure: Secondary | ICD-10-CM | POA: Diagnosis not present

## 2021-10-30 DIAGNOSIS — H2513 Age-related nuclear cataract, bilateral: Secondary | ICD-10-CM | POA: Diagnosis not present

## 2021-10-30 DIAGNOSIS — E119 Type 2 diabetes mellitus without complications: Secondary | ICD-10-CM | POA: Diagnosis not present

## 2021-10-30 DIAGNOSIS — H43813 Vitreous degeneration, bilateral: Secondary | ICD-10-CM | POA: Diagnosis not present

## 2021-11-15 ENCOUNTER — Other Ambulatory Visit: Payer: Medicare Other

## 2021-11-15 DIAGNOSIS — E1122 Type 2 diabetes mellitus with diabetic chronic kidney disease: Secondary | ICD-10-CM | POA: Diagnosis not present

## 2021-11-15 DIAGNOSIS — N170 Acute kidney failure with tubular necrosis: Secondary | ICD-10-CM | POA: Diagnosis not present

## 2021-11-15 DIAGNOSIS — N189 Chronic kidney disease, unspecified: Secondary | ICD-10-CM | POA: Diagnosis not present

## 2021-11-15 DIAGNOSIS — I5032 Chronic diastolic (congestive) heart failure: Secondary | ICD-10-CM | POA: Diagnosis not present

## 2021-11-15 DIAGNOSIS — R808 Other proteinuria: Secondary | ICD-10-CM | POA: Diagnosis not present

## 2021-11-15 LAB — MICROALBUMIN, URINE: Microalb, Ur: HIGH

## 2021-12-01 DIAGNOSIS — I129 Hypertensive chronic kidney disease with stage 1 through stage 4 chronic kidney disease, or unspecified chronic kidney disease: Secondary | ICD-10-CM | POA: Diagnosis not present

## 2021-12-01 DIAGNOSIS — N170 Acute kidney failure with tubular necrosis: Secondary | ICD-10-CM | POA: Diagnosis not present

## 2021-12-01 DIAGNOSIS — D638 Anemia in other chronic diseases classified elsewhere: Secondary | ICD-10-CM | POA: Diagnosis not present

## 2021-12-01 DIAGNOSIS — D696 Thrombocytopenia, unspecified: Secondary | ICD-10-CM | POA: Diagnosis not present

## 2021-12-01 DIAGNOSIS — N189 Chronic kidney disease, unspecified: Secondary | ICD-10-CM | POA: Diagnosis not present

## 2021-12-01 DIAGNOSIS — E1122 Type 2 diabetes mellitus with diabetic chronic kidney disease: Secondary | ICD-10-CM | POA: Diagnosis not present

## 2021-12-01 DIAGNOSIS — E211 Secondary hyperparathyroidism, not elsewhere classified: Secondary | ICD-10-CM | POA: Diagnosis not present

## 2021-12-01 DIAGNOSIS — R808 Other proteinuria: Secondary | ICD-10-CM | POA: Diagnosis not present

## 2021-12-01 DIAGNOSIS — I5032 Chronic diastolic (congestive) heart failure: Secondary | ICD-10-CM | POA: Diagnosis not present

## 2021-12-01 DIAGNOSIS — E559 Vitamin D deficiency, unspecified: Secondary | ICD-10-CM | POA: Diagnosis not present

## 2021-12-01 DIAGNOSIS — E875 Hyperkalemia: Secondary | ICD-10-CM | POA: Diagnosis not present

## 2021-12-06 ENCOUNTER — Emergency Department (HOSPITAL_COMMUNITY): Payer: Medicare Other

## 2021-12-06 ENCOUNTER — Inpatient Hospital Stay (HOSPITAL_COMMUNITY)
Admission: EM | Admit: 2021-12-06 | Discharge: 2021-12-13 | DRG: 853 | Disposition: A | Discharge status: Home / Self Care | Payer: Medicare Other | Attending: Internal Medicine | Admitting: Internal Medicine

## 2021-12-06 ENCOUNTER — Other Ambulatory Visit: Payer: Self-pay | Admitting: Family Medicine

## 2021-12-06 ENCOUNTER — Other Ambulatory Visit: Payer: Self-pay

## 2021-12-06 DIAGNOSIS — I471 Supraventricular tachycardia, unspecified: Secondary | ICD-10-CM | POA: Diagnosis present

## 2021-12-06 DIAGNOSIS — I10 Essential (primary) hypertension: Secondary | ICD-10-CM | POA: Diagnosis present

## 2021-12-06 DIAGNOSIS — R072 Precordial pain: Principal | ICD-10-CM

## 2021-12-06 DIAGNOSIS — Z7984 Long term (current) use of oral hypoglycemic drugs: Secondary | ICD-10-CM

## 2021-12-06 DIAGNOSIS — E1165 Type 2 diabetes mellitus with hyperglycemia: Secondary | ICD-10-CM | POA: Diagnosis present

## 2021-12-06 DIAGNOSIS — I2510 Atherosclerotic heart disease of native coronary artery without angina pectoris: Secondary | ICD-10-CM | POA: Diagnosis not present

## 2021-12-06 DIAGNOSIS — Z7982 Long term (current) use of aspirin: Secondary | ICD-10-CM

## 2021-12-06 DIAGNOSIS — R0602 Shortness of breath: Secondary | ICD-10-CM | POA: Diagnosis not present

## 2021-12-06 DIAGNOSIS — E1122 Type 2 diabetes mellitus with diabetic chronic kidney disease: Secondary | ICD-10-CM | POA: Diagnosis present

## 2021-12-06 DIAGNOSIS — A419 Sepsis, unspecified organism: Principal | ICD-10-CM | POA: Diagnosis present

## 2021-12-06 DIAGNOSIS — E7800 Pure hypercholesterolemia, unspecified: Secondary | ICD-10-CM | POA: Diagnosis present

## 2021-12-06 DIAGNOSIS — Z79899 Other long term (current) drug therapy: Secondary | ICD-10-CM | POA: Diagnosis not present

## 2021-12-06 DIAGNOSIS — Z955 Presence of coronary angioplasty implant and graft: Secondary | ICD-10-CM

## 2021-12-06 DIAGNOSIS — E785 Hyperlipidemia, unspecified: Secondary | ICD-10-CM | POA: Diagnosis not present

## 2021-12-06 DIAGNOSIS — E872 Acidosis, unspecified: Secondary | ICD-10-CM | POA: Diagnosis not present

## 2021-12-06 DIAGNOSIS — I214 Non-ST elevation (NSTEMI) myocardial infarction: Secondary | ICD-10-CM | POA: Diagnosis present

## 2021-12-06 DIAGNOSIS — I130 Hypertensive heart and chronic kidney disease with heart failure and stage 1 through stage 4 chronic kidney disease, or unspecified chronic kidney disease: Secondary | ICD-10-CM | POA: Diagnosis not present

## 2021-12-06 DIAGNOSIS — N179 Acute kidney failure, unspecified: Secondary | ICD-10-CM | POA: Diagnosis not present

## 2021-12-06 DIAGNOSIS — Z8249 Family history of ischemic heart disease and other diseases of the circulatory system: Secondary | ICD-10-CM

## 2021-12-06 DIAGNOSIS — I5021 Acute systolic (congestive) heart failure: Secondary | ICD-10-CM | POA: Diagnosis not present

## 2021-12-06 DIAGNOSIS — I5043 Acute on chronic combined systolic (congestive) and diastolic (congestive) heart failure: Secondary | ICD-10-CM | POA: Diagnosis not present

## 2021-12-06 DIAGNOSIS — I255 Ischemic cardiomyopathy: Secondary | ICD-10-CM | POA: Diagnosis present

## 2021-12-06 DIAGNOSIS — R0603 Acute respiratory distress: Secondary | ICD-10-CM

## 2021-12-06 DIAGNOSIS — I252 Old myocardial infarction: Secondary | ICD-10-CM

## 2021-12-06 DIAGNOSIS — I447 Left bundle-branch block, unspecified: Secondary | ICD-10-CM | POA: Diagnosis present

## 2021-12-06 DIAGNOSIS — E1169 Type 2 diabetes mellitus with other specified complication: Secondary | ICD-10-CM | POA: Diagnosis not present

## 2021-12-06 DIAGNOSIS — R6520 Severe sepsis without septic shock: Secondary | ICD-10-CM | POA: Diagnosis not present

## 2021-12-06 DIAGNOSIS — Z20822 Contact with and (suspected) exposure to covid-19: Secondary | ICD-10-CM | POA: Diagnosis present

## 2021-12-06 DIAGNOSIS — R6889 Other general symptoms and signs: Secondary | ICD-10-CM | POA: Diagnosis not present

## 2021-12-06 DIAGNOSIS — N189 Chronic kidney disease, unspecified: Secondary | ICD-10-CM | POA: Diagnosis not present

## 2021-12-06 DIAGNOSIS — N184 Chronic kidney disease, stage 4 (severe): Secondary | ICD-10-CM | POA: Diagnosis present

## 2021-12-06 DIAGNOSIS — Z634 Disappearance and death of family member: Secondary | ICD-10-CM

## 2021-12-06 DIAGNOSIS — D631 Anemia in chronic kidney disease: Secondary | ICD-10-CM | POA: Diagnosis not present

## 2021-12-06 DIAGNOSIS — J9601 Acute respiratory failure with hypoxia: Secondary | ICD-10-CM | POA: Diagnosis present

## 2021-12-06 DIAGNOSIS — R079 Chest pain, unspecified: Secondary | ICD-10-CM | POA: Diagnosis not present

## 2021-12-06 DIAGNOSIS — E1159 Type 2 diabetes mellitus with other circulatory complications: Secondary | ICD-10-CM | POA: Diagnosis not present

## 2021-12-06 DIAGNOSIS — J9602 Acute respiratory failure with hypercapnia: Secondary | ICD-10-CM | POA: Diagnosis present

## 2021-12-06 DIAGNOSIS — F17210 Nicotine dependence, cigarettes, uncomplicated: Secondary | ICD-10-CM | POA: Diagnosis not present

## 2021-12-06 DIAGNOSIS — J189 Pneumonia, unspecified organism: Secondary | ICD-10-CM | POA: Diagnosis present

## 2021-12-06 DIAGNOSIS — E875 Hyperkalemia: Secondary | ICD-10-CM | POA: Diagnosis present

## 2021-12-06 DIAGNOSIS — I152 Hypertension secondary to endocrine disorders: Secondary | ICD-10-CM | POA: Diagnosis not present

## 2021-12-06 DIAGNOSIS — Z635 Disruption of family by separation and divorce: Secondary | ICD-10-CM

## 2021-12-06 DIAGNOSIS — E878 Other disorders of electrolyte and fluid balance, not elsewhere classified: Secondary | ICD-10-CM | POA: Diagnosis present

## 2021-12-06 DIAGNOSIS — I499 Cardiac arrhythmia, unspecified: Secondary | ICD-10-CM | POA: Diagnosis not present

## 2021-12-06 DIAGNOSIS — Z743 Need for continuous supervision: Secondary | ICD-10-CM | POA: Diagnosis not present

## 2021-12-06 HISTORY — DX: Type 2 diabetes mellitus without complications: E11.9

## 2021-12-06 HISTORY — DX: Atherosclerotic heart disease of native coronary artery without angina pectoris: I25.10

## 2021-12-06 HISTORY — DX: Hyperkalemia: E87.5

## 2021-12-06 HISTORY — DX: Proteinuria, unspecified: R80.9

## 2021-12-06 HISTORY — DX: Ischemic cardiomyopathy: I25.5

## 2021-12-06 LAB — RESP PANEL BY RT-PCR (FLU A&B, COVID) ARPGX2
Influenza A by PCR: NEGATIVE
Influenza B by PCR: NEGATIVE
SARS Coronavirus 2 by RT PCR: NEGATIVE

## 2021-12-06 LAB — I-STAT ARTERIAL BLOOD GAS, ED
Acid-base deficit: 8 mmol/L — ABNORMAL HIGH (ref 0.0–2.0)
Bicarbonate: 19.4 mmol/L — ABNORMAL LOW (ref 20.0–28.0)
Calcium, Ion: 1.24 mmol/L (ref 1.15–1.40)
HCT: 32 % — ABNORMAL LOW (ref 39.0–52.0)
Hemoglobin: 10.9 g/dL — ABNORMAL LOW (ref 13.0–17.0)
O2 Saturation: 100 %
Potassium: 4.7 mmol/L (ref 3.5–5.1)
Sodium: 136 mmol/L (ref 135–145)
TCO2: 21 mmol/L — ABNORMAL LOW (ref 22–32)
pCO2 arterial: 45.2 mmHg (ref 32–48)
pH, Arterial: 7.241 — ABNORMAL LOW (ref 7.35–7.45)
pO2, Arterial: 207 mmHg — ABNORMAL HIGH (ref 83–108)

## 2021-12-06 LAB — I-STAT VENOUS BLOOD GAS, ED
Acid-base deficit: 9 mmol/L — ABNORMAL HIGH (ref 0.0–2.0)
Bicarbonate: 19.4 mmol/L — ABNORMAL LOW (ref 20.0–28.0)
Calcium, Ion: 1.22 mmol/L (ref 1.15–1.40)
HCT: 37 % — ABNORMAL LOW (ref 39.0–52.0)
Hemoglobin: 12.6 g/dL — ABNORMAL LOW (ref 13.0–17.0)
O2 Saturation: 94 %
Potassium: 4.5 mmol/L (ref 3.5–5.1)
Sodium: 138 mmol/L (ref 135–145)
TCO2: 21 mmol/L — ABNORMAL LOW (ref 22–32)
pCO2, Ven: 51.4 mmHg (ref 44–60)
pH, Ven: 7.185 — CL (ref 7.25–7.43)
pO2, Ven: 86 mmHg — ABNORMAL HIGH (ref 32–45)

## 2021-12-06 LAB — CBC
HCT: 39.7 % (ref 39.0–52.0)
Hemoglobin: 12.7 g/dL — ABNORMAL LOW (ref 13.0–17.0)
MCH: 31.2 pg (ref 26.0–34.0)
MCHC: 32 g/dL (ref 30.0–36.0)
MCV: 97.5 fL (ref 80.0–100.0)
Platelets: 198 10*3/uL (ref 150–400)
RBC: 4.07 MIL/uL — ABNORMAL LOW (ref 4.22–5.81)
RDW: 12.2 % (ref 11.5–15.5)
WBC: 14.5 10*3/uL — ABNORMAL HIGH (ref 4.0–10.5)
nRBC: 0 % (ref 0.0–0.2)

## 2021-12-06 LAB — BASIC METABOLIC PANEL
Anion gap: 10 (ref 5–15)
BUN: 62 mg/dL — ABNORMAL HIGH (ref 8–23)
CO2: 18 mmol/L — ABNORMAL LOW (ref 22–32)
Calcium: 8.7 mg/dL — ABNORMAL LOW (ref 8.9–10.3)
Chloride: 110 mmol/L (ref 98–111)
Creatinine, Ser: 4.13 mg/dL — ABNORMAL HIGH (ref 0.61–1.24)
GFR, Estimated: 14 mL/min — ABNORMAL LOW (ref >60)
Glucose, Bld: 353 mg/dL — ABNORMAL HIGH (ref 70–99)
Potassium: 4.6 mmol/L (ref 3.5–5.1)
Sodium: 138 mmol/L (ref 135–145)

## 2021-12-06 LAB — CBG MONITORING, ED: Glucose-Capillary: 306 mg/dL — ABNORMAL HIGH (ref 70–99)

## 2021-12-06 LAB — TROPONIN I (HIGH SENSITIVITY)
Troponin I (High Sensitivity): 36 ng/L — ABNORMAL HIGH (ref <18)
Troponin I (High Sensitivity): 373 ng/L (ref <18)

## 2021-12-06 LAB — BRAIN NATRIURETIC PEPTIDE: B Natriuretic Peptide: 509.9 pg/mL — ABNORMAL HIGH (ref 0.0–100.0)

## 2021-12-06 LAB — PROTIME-INR
INR: 1.1 (ref 0.8–1.2)
Prothrombin Time: 13.8 seconds (ref 11.4–15.2)

## 2021-12-06 MED ORDER — SODIUM CHLORIDE 0.9 % IV SOLN
1.0000 g | Freq: Once | INTRAVENOUS | Status: AC
  Administered 2021-12-06: 1 g via INTRAVENOUS
  Filled 2021-12-06: qty 10

## 2021-12-06 MED ORDER — SODIUM CHLORIDE 0.9 % IV SOLN: 2.0000 g | Freq: Every day | INTRAVENOUS | Status: DC

## 2021-12-06 MED ORDER — ENOXAPARIN SODIUM 30 MG/0.3ML IJ SOSY: 30.0000 mg | PREFILLED_SYRINGE | Freq: Every day | INTRAMUSCULAR | Status: DC

## 2021-12-06 MED ORDER — SODIUM CHLORIDE 0.9 % IV SOLN
500.0000 mg | Freq: Once | INTRAVENOUS | Status: DC
  Administered 2021-12-06: 500 mg via INTRAVENOUS
  Filled 2021-12-06: qty 5

## 2021-12-06 MED ORDER — SODIUM CHLORIDE 0.9 % IV SOLN
100.0000 mg | Freq: Two times a day (BID) | INTRAVENOUS | Status: DC
  Administered 2021-12-07: 100 mg via INTRAVENOUS
  Filled 2021-12-06: qty 100

## 2021-12-06 MED ORDER — ALBUTEROL SULFATE HFA 108 (90 BASE) MCG/ACT IN AERS
2.0000 | INHALATION_SPRAY | RESPIRATORY_TRACT | Status: DC | PRN
  Filled 2021-12-06: qty 6.7

## 2021-12-06 MED ORDER — INSULIN ASPART 100 UNIT/ML IJ SOLN
0.0000 [IU] | INTRAMUSCULAR | Status: DC
  Administered 2021-12-06: 5 [IU] via SUBCUTANEOUS
  Administered 2021-12-07: 1 [IU] via SUBCUTANEOUS
  Administered 2021-12-07: 5 [IU] via SUBCUTANEOUS
  Administered 2021-12-07: 2 [IU] via SUBCUTANEOUS
  Administered 2021-12-08: 1 [IU] via SUBCUTANEOUS
  Administered 2021-12-08 (×2): 2 [IU] via SUBCUTANEOUS
  Administered 2021-12-08: 1 [IU] via SUBCUTANEOUS
  Administered 2021-12-09: 2 [IU] via SUBCUTANEOUS

## 2021-12-06 MED ORDER — SODIUM CHLORIDE 0.9 % IV SOLN: INTRAVENOUS | Status: DC

## 2021-12-06 NOTE — ED Provider Notes (Signed)
[facility] EMERGENCY DEPARTMENT Provider Note   CSN: [id number] Arrival date & time: 12/06/21  1940     History  Chief Complaint  Patient presents with   Respiratory Distress    Austin Weber is a 71 y.o. male.  Patient was having acute respiratory difficulty EMS and oxygen sats were 78%.  They thought he was wheezing he got 5 of albuterol.  Also he had 7 out of 10 substernal chest pain that went to the left arm.  And that he got 2 nitro sublingual for that.  And all the chest pain went away.  Patient arrived here in obvious respiratory distress diaphoretic.  Immediately put on BiPAP immediately started to get better very quickly.  Patient does not use any oxygen at home patient does have a history of coronary artery disease had stents placed about 15 years ago.  EKG here today shows a left bundle branch block which is new he did not have that 12 years ago.  After being placed on BiPAP patient is felt very comfortable with the breathing.  And chest pain has not reoccurred.  Past medical history significant for diabetes hypertension hyperlipidemia chronic kidney disease in the history of coronary artery disease he says with stents.       Home Medications Prior to Admission medications   Medication Sig Start Date End Date Taking? Authorizing Provider  amLODipine (NORVASC) 10 MG tablet Take 1 tablet (10 mg total) by mouth daily. 02/09/21   Dettinger, Fransisca Kaufmann, MD  aspirin EC 81 MG tablet Take 81 mg by mouth 2 (two) times daily.    [provider]  atorvastatin (LIPITOR) 20 MG tablet Take 1 tablet (20 mg total) by mouth daily. 02/09/21   Dettinger, Fransisca Kaufmann, MD  Blood Glucose Monitoring Suppl [facility] VERIO FLEX SYSTEM) w/Device KIT USE AS DIRECTED 09/13/21   Dettinger, Fransisca Kaufmann, MD  carvedilol (COREG) 25 MG tablet Take 25 mg by mouth 2 (two) times daily. 03/09/21   [provider]  fluticasone (FLONASE) 50 MCG/ACT nasal spray Place 1 spray into both  nostrils 2 (two) times daily as needed for allergies or rhinitis. 06/11/21   Dettinger, Fransisca Kaufmann, MD  glipiZIDE (GLUCOTROL XL) 10 MG 24 hr tablet Take 1 tablet (10 mg total) by mouth 2 (two) times daily. 02/09/21   Dettinger, Fransisca Kaufmann, MD  losartan (COZAAR) 100 MG tablet Take 1 tablet (100 mg total) by mouth daily. 02/09/21   Dettinger, Fransisca Kaufmann, MD  OneTouch Delica Lancets 95M MISC TWICE DAILY 09/13/21   Dettinger, Fransisca Kaufmann, MD  [facility] VERIO test strip TWICE DAILY 09/13/21   Dettinger, Fransisca Kaufmann, MD  tadalafil (CIALIS) 5 MG tablet TAKE ONE (1) TABLET EACH DAY 09/13/21   Dettinger, Fransisca Kaufmann, MD  terazosin (HYTRIN) 2 MG capsule Take 1 capsule (2 mg total) by mouth at bedtime. 02/09/21   Dettinger, Fransisca Kaufmann, MD      Allergies    Patient has no known allergies.    Review of Systems   Review of Systems  Constitutional:  Negative for chills and fever.  HENT:  Negative for ear pain and sore throat.   Eyes:  Negative for pain and visual disturbance.  Respiratory:  Positive for shortness of breath. Negative for cough.   Cardiovascular:  Positive for chest pain. Negative for palpitations.  Gastrointestinal:  Negative for abdominal pain and vomiting.  Genitourinary:  Negative for dysuria and hematuria.  Musculoskeletal:  Negative for arthralgias and back pain.  Skin:  Negative for color change and rash.  Neurological:  Negative for seizures and syncope.  All other systems reviewed and are negative.   Physical Exam Updated Vital Signs BP (!) 148/94   Pulse (!) 103   Resp (!) 31   Ht 1.854 m (6' 1")   Wt 92.5 kg   SpO2 93%   BMI 26.91 kg/m  Physical Exam Vitals and nursing note reviewed.  Constitutional:      General: He is in acute distress.     Appearance: He is well-developed. He is diaphoretic.  HENT:     Head: Normocephalic and atraumatic.  Eyes:     Extraocular Movements: Extraocular movements intact.     Conjunctiva/sclera: Conjunctivae normal.     Pupils: Pupils are equal, round, and  reactive to light.  Cardiovascular:     Rate and Rhythm: Normal rate and regular rhythm.     Heart sounds: No murmur heard. Pulmonary:     Effort: Respiratory distress present.     Breath sounds: Normal breath sounds. No wheezing, rhonchi or rales.  Abdominal:     Palpations: Abdomen is soft.     Tenderness: There is no abdominal tenderness.  Musculoskeletal:        General: No swelling.     Cervical back: Normal range of motion and neck supple.     Right lower leg: No edema.     Left lower leg: No edema.  Skin:    General: Skin is warm.     Capillary Refill: Capillary refill takes less than 2 seconds.  Neurological:     General: No focal deficit present.     Mental Status: He is alert and oriented to person, place, and time.  Psychiatric:        Mood and Affect: Mood normal.     ED Results / Procedures / Treatments   Labs (all labs ordered are listed, but only abnormal results are displayed) Labs Reviewed  CBC - Abnormal; Notable for the following components:      Result Value   WBC 14.5 (*)    RBC 4.07 (*)    Hemoglobin 12.7 (*)    All other components within normal limits  RESP PANEL BY RT-PCR (FLU A&B, COVID) ARPGX2  PROTIME-INR  BASIC METABOLIC PANEL  BRAIN NATRIURETIC PEPTIDE  I-STAT ARTERIAL BLOOD GAS, ED  TROPONIN I (HIGH SENSITIVITY)    EKG EKG Interpretation  Date/Time:  Thursday December 06 2021 19:48:40 EST Ventricular Rate:  125 PR Interval:  112 QRS Duration: 178 QT Interval:  430 QTC Calculation: 623 R Axis:   64 Text Interpretation: Sinus or ectopic atrial tachycardia Left bundle branch block New since previous tracing Confirmed by Fredia Sorrow [phone]) on 12/06/2021 7:59:35 PM  Radiology DG Chest Portable 1 View  Result Date: 12/06/2021 CLINICAL DATA:  Chest pain and shortness of breath EXAM: PORTABLE CHEST 1 VIEW COMPARISON:  None FINDINGS: Cardiac shadow is at the upper limits of normal in size. Diffuse right basilar infiltrate is  noted consistent with pneumonia. No sizable effusion is seen. No bony abnormality is noted. IMPRESSION: Right basilar pneumonia Electronically Signed   By: Inez Catalina M.D.   On: 12/06/2021 20:29    Procedures Procedures    Medications Ordered in ED Medications  albuterol (VENTOLIN HFA) 108 (90 Base) MCG/ACT inhaler 2 puff (has no administration in time range)  0.9 %  sodium chloride infusion ( Intravenous New Bag/Given 12/06/21 2019)    ED Course/ Medical Decision Making/ A&P  Medical Decision Making Amount and/or Complexity of Data Reviewed Labs: ordered. Radiology: ordered.  Risk Prescription drug management. Decision regarding hospitalization.  CRITICAL CARE Performed by: Fredia Sorrow Total critical care time: 45 minutes Critical care time was exclusive of separately billable procedures and treating other patients. Critical care was necessary to treat or prevent imminent or life-threatening deterioration. Critical care was time spent personally by me on the following activities: development of treatment plan with patient and/or surrogate as well as nursing, discussions with consultants, evaluation of patient's response to treatment, examination of patient, obtaining history from patient or surrogate, ordering and performing treatments and interventions, ordering and review of laboratory studies, ordering and review of radiographic studies, pulse oximetry and re-evaluation of patient's condition.  Clearly in respiratory distress improved significantly on BiPAP.  Patient's chest pain had already resolved by the time he arrived.  Apparently started 1 hour prior to arrival.  Initial troponin was elevated at 36 but delta troponin went up to 373.  Patient after being on BiPAP had an i-STAT arterial blood gas had a pH 7.24 PCO2 was good at 207 they decreased his oxygen and PCO2 was 45.  Bicarb was 19.4.  So does not appear to be a chronic retainer.  COVID  influenza testing is pending.  Patient metabolic panel creatinine was 4.13 has had elevated creatinines before but this is significantly worse of the sort of an acute on chronic kidney injury there.  BNP was 509 portable chest x-ray showed a right basilar pneumonia.  For the pneumonia patient started on Rocephin and Zithromax IV.  Discussed with hospitalist who will admit. Final Clinical Impression(s) / ED Diagnoses Final diagnoses:  Precordial pain  Respiratory distress    Rx / DC Orders ED Discharge Orders     None         Fredia Sorrow, MD 12/06/21 2320

## 2021-12-06 NOTE — H&P (Addendum)
History and Physical  Austin Weber DJM:[id number] DOB: Sep 30, 1950 DOA: 12/06/2021  Referring physician: Dr. Rogene Weber, [place]. PCP: Austin Weber, Austin Kaufmann, MD  Outpatient Specialists: None Patient coming from: Home.  Chief Complaint: Respiratory distress.  HPI: Austin Weber is a 71 y.o. male with medical history significant for CKD 3B, DM2, anemia of chronic disease, chronic diastolic CHF, hypertension, who presented to [facility] ED from home due to sudden onset shortness of breath.  Per EMS encounter he was in respiratory distress and was satting in the 70s, with increased work of breathing.  Ongoing tobacco user, has cut down to 10 cigarettes/day.  Denies subjective fevers or chills.  No dysphagia.  No use of alcohol.  No lower extremity edema.  Denies chest pain or palpitations.  In the ED, workup revealed right lower lobe infiltrate suggestive of pneumonia, mild to moderate leukocytosis.  Also showing elevated troponin, greater than 300.  New LBBB in comparison to twelve-lead EKG done in 2012.  The patient was placed on BiPAP in the ED due to increased work of breathing and acute respiratory distress.  He is tolerating the BiPAP well.  ED Course: 148/77, pulse 104, respiratory 22.  O2 saturation 93% on BiPAP.  Review of Systems: Review of systems as noted in the HPI. All other systems reviewed and are negative.   Past Medical History:  Diagnosis Date   Chronic kidney disease    Diabetes mellitus without complication ([place])    Hyperlipidemia    Hypertension    Past Surgical History:  Procedure Laterality Date   SHOULDER SURGERY Left     Social History:  reports that he has been smoking cigarettes. He has a 20.00 pack-year smoking history. He has been exposed to tobacco smoke. He has never used smokeless tobacco. He reports that he does not drink alcohol and does not use drugs.   No Known Allergies  Family History  Problem Relation Age of Onset   Heart disease Father        Prior to Admission medications   Medication Sig Start Date End Date Taking? Authorizing Provider  amLODipine (NORVASC) 10 MG tablet Take 1 tablet (10 mg total) by mouth daily. 02/09/21   Austin Weber, Austin Kaufmann, MD  aspirin EC 81 MG tablet Take 81 mg by mouth 2 (two) times daily.    [provider]  atorvastatin (LIPITOR) 20 MG tablet Take 1 tablet (20 mg total) by mouth daily. 02/09/21   Austin Weber, Austin Kaufmann, MD  Blood Glucose Monitoring Suppl [facility] VERIO FLEX SYSTEM) w/Device KIT USE AS DIRECTED 09/13/21   Austin Weber, Austin Kaufmann, MD  carvedilol (COREG) 25 MG tablet Take 25 mg by mouth 2 (two) times daily. 03/09/21   [provider]  fluticasone (FLONASE) 50 MCG/ACT nasal spray Place 1 spray into both nostrils 2 (two) times daily as needed for allergies or rhinitis. 06/11/21   Austin Weber, Austin Kaufmann, MD  glipiZIDE (GLUCOTROL XL) 10 MG 24 hr tablet Take 1 tablet (10 mg total) by mouth 2 (two) times daily. 02/09/21   Austin Weber, Austin Kaufmann, MD  losartan (COZAAR) 100 MG tablet Take 1 tablet (100 mg total) by mouth daily. 02/09/21   Austin Weber, Austin Kaufmann, MD  OneTouch Delica Lancets 22W MISC TWICE DAILY 09/13/21   Austin Weber, Austin Kaufmann, MD  [facility] VERIO test strip TWICE DAILY 09/13/21   Austin Weber, Austin Kaufmann, MD  tadalafil (CIALIS) 5 MG tablet TAKE ONE (1) TABLET EACH DAY 09/13/21   Austin Weber, Austin Kaufmann, MD  terazosin (HYTRIN) 2 MG capsule Take 1  capsule (2 mg total) by mouth at bedtime. 02/09/21   Austin Weber, Austin Kaufmann, MD    Physical Exam: BP 132/70   Pulse 96   Resp (!) 21   Ht _0  (1.854 m)   Wt 92.5 kg   SpO2 94%   BMI 26.91 kg/m   General: 71 y.o. year-old male well developed well nourished in no acute distress.  Alert and oriented x3. Cardiovascular: Regular rate and rhythm with no rubs or gallops.  No thyromegaly or JVD noted.  No lower extremity edema. 2/4 pulses in all 4 extremities. Respiratory: Mild rales at bases.  No wheezing noted.  Poor inspiratory effort. Abdomen: Soft nontender  nondistended with normal bowel sounds x4 quadrants. Muskuloskeletal: No cyanosis, clubbing or edema noted bilaterally Neuro: CN II-XII intact, strength, sensation, reflexes Skin: No ulcerative lesions noted or rashes Psychiatry: Judgement and insight appear normal. Mood is appropriate for condition and setting          Labs on Admission:  Basic Metabolic Panel: Recent Labs  Lab 12/06/21 1950 12/06/21 2100 12/06/21 2103  NA 138 138 136  K 4.6 4.5 4.7  CL 110  --   --   CO2 18*  --   --   GLUCOSE 353*  --   --   BUN 62*  --   --   CREATININE 4.13*  --   --   CALCIUM 8.7*  --   --    Liver Function Tests: No results for input(s): "AST", "ALT", "ALKPHOS", "BILITOT", "PROT", "ALBUMIN" in the last 168 hours. No results for input(s): "LIPASE", "AMYLASE" in the last 168 hours. No results for input(s): "AMMONIA" in the last 168 hours. CBC: Recent Labs  Lab 12/06/21 1950 12/06/21 2100 12/06/21 2103  WBC 14.5*  --   --   HGB 12.7* 12.6* 10.9*  HCT 39.7 37.0* 32.0*  MCV 97.5  --   --   PLT 198  --   --    Cardiac Enzymes: No results for input(s): "CKTOTAL", "CKMB", "CKMBINDEX", "TROPONINI" in the last 168 hours.  BNP (last 3 results) Recent Labs    12/06/21 1950  BNP 509.9*    ProBNP (last 3 results) No results for input(s): "PROBNP" in the last 8760 hours.  CBG: Recent Labs  Lab 12/06/21 2323  GLUCAP 306*    Radiological Exams on Admission: DG Chest Portable 1 View  Result Date: 12/06/2021 CLINICAL DATA:  Chest pain and shortness of breath EXAM: PORTABLE CHEST 1 VIEW COMPARISON:  None FINDINGS: Cardiac shadow is at the upper limits of normal in size. Diffuse right basilar infiltrate is noted consistent with pneumonia. No sizable effusion is seen. No bony abnormality is noted. IMPRESSION: Right basilar pneumonia Electronically Signed   By: Inez Catalina M.D.   On: 12/06/2021 20:29    EKG: I independently viewed the EKG done and my findings are as followed: Sinus  or ectopic atrial tachycardia, rate of 125, QTc 623.  Assessment/Plan Present on Admission:  CAP (community acquired pneumonia)  Principal Problem:   CAP (community acquired pneumonia)  Right lower lobe community-acquired pneumonia, POA Continue Rocephin started in the ED.   Replaced IV azithromycin by IV doxycycline due to prolonged QTc.   Baseline procalcitonin Monitor fever curve and WBC.  Elevated troponin, suspect demand ischemia The patient denies any chest pain Trend troponin New LBBB on twelve-lead EKG, compared to twelve-lead EKG done in 2012 2D echo ordered, follow results. Closely monitor on telemetry.  Acute hypoxic respiratory failure secondary  to pneumonia, POA Currently on BiPAP Not on oxygen supplementation at baseline Wean off BiPAP and oxygen supplementation as tolerated. Incentive spirometer Bronchodilators  Concern for acute CHF BNP greater than 500 Elevated troponin greater than 300 Follow 2D echo Start strict I's and O's and daily weight  Prolonged QTc QTc on twelve-lead EKG 623 Avoid QTc prolonging agents Optimize magnesium and potassium levels Repeat twelve-lead EKG in the morning.  AKI on CKD 3B Baseline creatinine appears to be 2.2 with GFR 30 Presented with creatinine of 4.13 with GFR 14. Avoid nephrotoxic agents and hypotension. Monitor urine output Repeat renal function test in the morning  Non anion gap metabolic acidosis Serum bicarb 18, anion gap 10. Monitor and repeat BMP in the morning.  Type 2 diabetes with hyperglycemia Hold off home oral hypoglycemics Obtain hemoglobin A1c Start insulin sliding scale every 4 hours while NPO    Critical care time: 65 minutes.    DVT prophylaxis: Subcu Lovenox daily  Code Status: Full code  Family Communication: None at bedside.  Disposition Plan: Admitted to progressive care unit  Consults called: Cardiology.  Admission status: Inpatient status.   Status is: Inpatient The  patient requires at least 2 midnights for further evaluation and treatment of present condition.   Kayleen Memos MD [facility] Pager [phone]  If 7PM-7AM, please contact night-coverage [email or web address] Password [email or web address]  12/07/2021, 12:16 AM

## 2021-12-06 NOTE — H&P (Incomplete)
History and Physical  Fremont Skalicky FUX:[id number] DOB: 12/02/1950 DOA: 12/06/2021  Referring physician: Dr. Rogene [place], [place]. PCP: Dettinger, Fransisca Kaufmann, MD  Outpatient Specialists: None Patient coming from: Home.  Chief Complaint: Respiratory distress.  HPI: Austin Weber is a 71 y.o. male with medical history significant for CKD 3B, DM2, anemia of chronic disease, chronic diastolic CHF, hypertension, who presented to [facility] ED from home due to sudden onset shortness of breath.  Per EMS encounter he was in respiratory distress and was satting in the 70s, with increased work of breathing.  Ongoing tobacco user, has cut down to 10 cigarettes/day.  Denies subjective fevers or chills.  No dysphagia.  No lower extremity edema.  Denies chest pain or palpitations.  In the ED, workup revealed right lower lobe infiltrate suggestive of pneumonia, mild to moderate leukocytosis.  Also showing elevated troponin, greater than 300.  New LBBB in comparison to twelve-lead EKG done in 2012.  The patient was placed on BiPAP in the ED due to increased work of breathing and acute respiratory distress.  He is tolerating the BiPAP well.  ED Course: 148/77, pulse 104, respiratory 22.  O2 saturation 93% on BiPAP.  Review of Systems: Review of systems as noted in the HPI. All other systems reviewed and are negative.   Past Medical History:  Diagnosis Date  . Chronic kidney disease   . Diabetes mellitus without complication ([place])   . Hyperlipidemia   . Hypertension    Past Surgical History:  Procedure Laterality Date  . SHOULDER SURGERY Left     Social History:  reports that he has been smoking cigarettes. He has a 20.00 pack-year smoking history. He has been exposed to tobacco smoke. He has never used smokeless tobacco. He reports that he does not drink alcohol and does not use drugs.   No Known Allergies  Family History  Problem Relation Age of Onset  . Heart disease Father       Prior to Admission  medications   Medication Sig Start Date End Date Taking? Authorizing Provider  amLODipine (NORVASC) 10 MG tablet Take 1 tablet (10 mg total) by mouth daily. 02/09/21   Dettinger, Fransisca Kaufmann, MD  aspirin EC 81 MG tablet Take 81 mg by mouth 2 (two) times daily.    [provider]  atorvastatin (LIPITOR) 20 MG tablet Take 1 tablet (20 mg total) by mouth daily. 02/09/21   Dettinger, Fransisca Kaufmann, MD  Blood Glucose Monitoring Suppl [facility] VERIO FLEX SYSTEM) w/Device KIT USE AS DIRECTED 09/13/21   Dettinger, Fransisca Kaufmann, MD  carvedilol (COREG) 25 MG tablet Take 25 mg by mouth 2 (two) times daily. 03/09/21   [provider]  fluticasone (FLONASE) 50 MCG/ACT nasal spray Place 1 spray into both nostrils 2 (two) times daily as needed for allergies or rhinitis. 06/11/21   Dettinger, Fransisca Kaufmann, MD  glipiZIDE (GLUCOTROL XL) 10 MG 24 hr tablet Take 1 tablet (10 mg total) by mouth 2 (two) times daily. 02/09/21   Dettinger, Fransisca Kaufmann, MD  losartan (COZAAR) 100 MG tablet Take 1 tablet (100 mg total) by mouth daily. 02/09/21   Dettinger, Fransisca Kaufmann, MD  OneTouch Delica Lancets 02R MISC TWICE DAILY 09/13/21   Dettinger, Fransisca Kaufmann, MD  [facility] VERIO test strip TWICE DAILY 09/13/21   Dettinger, Fransisca Kaufmann, MD  tadalafil (CIALIS) 5 MG tablet TAKE ONE (1) TABLET EACH DAY 09/13/21   Dettinger, Fransisca Kaufmann, MD  terazosin (HYTRIN) 2 MG capsule Take 1 capsule (2 mg total) by  mouth at bedtime. 02/09/21   Dettinger, Fransisca Kaufmann, MD    Physical Exam: BP (!) 160/77   Pulse 98   Resp (!) 21   Ht _0  (1.854 m)   Wt 92.5 kg   SpO2 90%   BMI 26.91 kg/m   General: 71 y.o. year-old male well developed well nourished in no acute distress.  Alert and oriented x3. Cardiovascular: Regular rate and rhythm with no rubs or gallops.  No thyromegaly or JVD noted.  No lower extremity edema. 2/4 pulses in all 4 extremities. Respiratory: Mild rales at bases.  No wheezing noted.  Poor inspiratory effort. Abdomen: Soft nontender nondistended with normal  bowel sounds x4 quadrants. Muskuloskeletal: No cyanosis, clubbing or edema noted bilaterally Neuro: CN II-XII intact, strength, sensation, reflexes Skin: No ulcerative lesions noted or rashes Psychiatry: Judgement and insight appear normal. Mood is appropriate for condition and setting          Labs on Admission:  Basic Metabolic Panel: Recent Labs  Lab 12/06/21 1950 12/06/21 2100 12/06/21 2103  NA 138 138 136  K 4.6 4.5 4.7  CL 110  --   --   CO2 18*  --   --   GLUCOSE 353*  --   --   BUN 62*  --   --   CREATININE 4.13*  --   --   CALCIUM 8.7*  --   --    Liver Function Tests: No results for input(s): "AST", "ALT", "ALKPHOS", "BILITOT", "PROT", "ALBUMIN" in the last 168 hours. No results for input(s): "LIPASE", "AMYLASE" in the last 168 hours. No results for input(s): "AMMONIA" in the last 168 hours. CBC: Recent Labs  Lab 12/06/21 1950 12/06/21 2100 12/06/21 2103  WBC 14.5*  --   --   HGB 12.7* 12.6* 10.9*  HCT 39.7 37.0* 32.0*  MCV 97.5  --   --   PLT 198  --   --    Cardiac Enzymes: No results for input(s): "CKTOTAL", "CKMB", "CKMBINDEX", "TROPONINI" in the last 168 hours.  BNP (last 3 results) Recent Labs    12/06/21 1950  BNP 509.9*    ProBNP (last 3 results) No results for input(s): "PROBNP" in the last 8760 hours.  CBG: No results for input(s): "GLUCAP" in the last 168 hours.  Radiological Exams on Admission: DG Chest Portable 1 View  Result Date: 12/06/2021 CLINICAL DATA:  Chest pain and shortness of breath EXAM: PORTABLE CHEST 1 VIEW COMPARISON:  None FINDINGS: Cardiac shadow is at the upper limits of normal in size. Diffuse right basilar infiltrate is noted consistent with pneumonia. No sizable effusion is seen. No bony abnormality is noted. IMPRESSION: Right basilar pneumonia Electronically Signed   By: Inez Catalina M.D.   On: 12/06/2021 20:29    EKG: I independently viewed the EKG done and my findings are as followed: Sinus or ectopic atrial  tachycardia, rate of 125, QTc 623.  Assessment/Plan Present on Admission: . CAP (community acquired pneumonia)  Principal Problem:   CAP (community acquired pneumonia)  Right lower lobe community-acquired pneumonia, POA Continue Rocephin started in the ED.   Replaced IV azithromycin by IV doxycycline due to prolonged QTc.   Baseline procalcitonin Monitor fever curve and WBC.  Elevated troponin, suspect demand ischemia The patient denies any chest pain Trend troponin New LBBB on twelve-lead EKG, compared to twelve-lead EKG done in 2012 2D echo ordered, follow results. Closely monitor on telemetry.  Prolonged QTc QTc on twelve-lead EKG 623 Avoid QTc prolonging  agents Optimize magnesium and potassium levels Repeat twelve-lead EKG in the morning.  DVT prophylaxis: ***   Code Status: ***   Family Communication: ***   Disposition Plan: ***   Consults called: ***   Admission status: ***    Status is: Inpatient {Inpatient:23812}   Kayleen Memos MD [facility] Pager [phone]  If 7PM-7AM, please contact night-coverage [email or web address] Password [facility]  12/06/2021, 10:52 PM

## 2021-12-06 NOTE — ED Triage Notes (Signed)
PT BIBGEMS for CP 1 hr ago, 7/10 center chest, left arm, center back, Sharp pain to back. Pt for EMS prominent wheezing. Oxygen saturation decreasing and pt placed on cpap.  Lowest oxygen for EMS 78%  5 of albuterol en route  2 Nitro given sublingual  EMS PEEP 12  Hx cardiac stent placement  Tachy & HTN   18 g LAC EMS

## 2021-12-07 ENCOUNTER — Encounter: Payer: Self-pay | Admitting: Family Medicine

## 2021-12-07 ENCOUNTER — Encounter (HOSPITAL_COMMUNITY): Payer: Self-pay | Admitting: Internal Medicine

## 2021-12-07 ENCOUNTER — Encounter (HOSPITAL_COMMUNITY)
Admission: EM | Disposition: A | Discharge status: Home / Self Care | Payer: Self-pay | Source: Home / Self Care | Attending: Internal Medicine

## 2021-12-07 ENCOUNTER — Inpatient Hospital Stay (HOSPITAL_COMMUNITY): Payer: Medicare Other

## 2021-12-07 ENCOUNTER — Other Ambulatory Visit (HOSPITAL_COMMUNITY): Payer: Medicare Other

## 2021-12-07 DIAGNOSIS — I214 Non-ST elevation (NSTEMI) myocardial infarction: Secondary | ICD-10-CM

## 2021-12-07 DIAGNOSIS — I152 Hypertension secondary to endocrine disorders: Secondary | ICD-10-CM

## 2021-12-07 DIAGNOSIS — N179 Acute kidney failure, unspecified: Secondary | ICD-10-CM

## 2021-12-07 DIAGNOSIS — I2510 Atherosclerotic heart disease of native coronary artery without angina pectoris: Secondary | ICD-10-CM

## 2021-12-07 DIAGNOSIS — I5021 Acute systolic (congestive) heart failure: Secondary | ICD-10-CM | POA: Diagnosis present

## 2021-12-07 DIAGNOSIS — F17210 Nicotine dependence, cigarettes, uncomplicated: Secondary | ICD-10-CM

## 2021-12-07 DIAGNOSIS — E1169 Type 2 diabetes mellitus with other specified complication: Secondary | ICD-10-CM

## 2021-12-07 DIAGNOSIS — J189 Pneumonia, unspecified organism: Secondary | ICD-10-CM

## 2021-12-07 DIAGNOSIS — N189 Chronic kidney disease, unspecified: Secondary | ICD-10-CM

## 2021-12-07 DIAGNOSIS — E785 Hyperlipidemia, unspecified: Secondary | ICD-10-CM

## 2021-12-07 DIAGNOSIS — E1159 Type 2 diabetes mellitus with other circulatory complications: Secondary | ICD-10-CM

## 2021-12-07 DIAGNOSIS — R7989 Other specified abnormal findings of blood chemistry: Secondary | ICD-10-CM | POA: Insufficient documentation

## 2021-12-07 HISTORY — PX: LEFT HEART CATH AND CORONARY ANGIOGRAPHY: CATH118249

## 2021-12-07 LAB — TSH: TSH: 2.371 u[IU]/mL (ref 0.350–4.500)

## 2021-12-07 LAB — CBC WITH DIFFERENTIAL/PLATELET
Abs Immature Granulocytes: 0.03 10*3/uL (ref 0.00–0.07)
Basophils Absolute: 0 10*3/uL (ref 0.0–0.1)
Basophils Relative: 0 %
Eosinophils Absolute: 0 10*3/uL (ref 0.0–0.5)
Eosinophils Relative: 0 %
HCT: 33.5 % — ABNORMAL LOW (ref 39.0–52.0)
Hemoglobin: 10.7 g/dL — ABNORMAL LOW (ref 13.0–17.0)
Immature Granulocytes: 0 %
Lymphocytes Relative: 9 %
Lymphs Abs: 0.9 10*3/uL (ref 0.7–4.0)
MCH: 31 pg (ref 26.0–34.0)
MCHC: 31.9 g/dL (ref 30.0–36.0)
MCV: 97.1 fL (ref 80.0–100.0)
Monocytes Absolute: 0.7 10*3/uL (ref 0.1–1.0)
Monocytes Relative: 7 %
Neutro Abs: 7.8 10*3/uL — ABNORMAL HIGH (ref 1.7–7.7)
Neutrophils Relative %: 84 %
Platelets: 130 10*3/uL — ABNORMAL LOW (ref 150–400)
RBC: 3.45 MIL/uL — ABNORMAL LOW (ref 4.22–5.81)
RDW: 12.4 % (ref 11.5–15.5)
WBC: 9.4 10*3/uL (ref 4.0–10.5)
nRBC: 0 % (ref 0.0–0.2)

## 2021-12-07 LAB — COMPREHENSIVE METABOLIC PANEL
ALT: 20 U/L (ref 0–44)
AST: 23 U/L (ref 15–41)
Albumin: 2.8 g/dL — ABNORMAL LOW (ref 3.5–5.0)
Alkaline Phosphatase: 57 U/L (ref 38–126)
Anion gap: 15 (ref 5–15)
BUN: 63 mg/dL — ABNORMAL HIGH (ref 8–23)
CO2: 17 mmol/L — ABNORMAL LOW (ref 22–32)
Calcium: 9 mg/dL (ref 8.9–10.3)
Chloride: 108 mmol/L (ref 98–111)
Creatinine, Ser: 3.72 mg/dL — ABNORMAL HIGH (ref 0.61–1.24)
GFR, Estimated: 16 mL/min — ABNORMAL LOW (ref >60)
Glucose, Bld: 289 mg/dL — ABNORMAL HIGH (ref 70–99)
Potassium: 4.5 mmol/L (ref 3.5–5.1)
Sodium: 140 mmol/L (ref 135–145)
Total Bilirubin: 0.2 mg/dL — ABNORMAL LOW (ref 0.3–1.2)
Total Protein: 5.5 g/dL — ABNORMAL LOW (ref 6.5–8.1)

## 2021-12-07 LAB — ECHOCARDIOGRAM COMPLETE
AR max vel: 2.92 cm2
AV Area VTI: 2.47 cm2
AV Area mean vel: 2.93 cm2
AV Mean grad: 4 mmHg
AV Peak grad: 7.1 mmHg
Ao pk vel: 1.33 m/s
Calc EF: 33.1 %
Height: 73 in
MV VTI: 2.62 cm2
S' Lateral: 3.3 cm
Single Plane A2C EF: 37.8 %
Single Plane A4C EF: 26.8 %
Weight: 3264 oz

## 2021-12-07 LAB — CBC
HCT: 35.8 % — ABNORMAL LOW (ref 39.0–52.0)
Hemoglobin: 12 g/dL — ABNORMAL LOW (ref 13.0–17.0)
MCH: 32 pg (ref 26.0–34.0)
MCHC: 33.5 g/dL (ref 30.0–36.0)
MCV: 95.5 fL (ref 80.0–100.0)
Platelets: 161 10*3/uL (ref 150–400)
RBC: 3.75 MIL/uL — ABNORMAL LOW (ref 4.22–5.81)
RDW: 12.4 % (ref 11.5–15.5)
WBC: 12.4 10*3/uL — ABNORMAL HIGH (ref 4.0–10.5)
nRBC: 0 % (ref 0.0–0.2)

## 2021-12-07 LAB — TROPONIN I (HIGH SENSITIVITY)
Troponin I (High Sensitivity): 1730 ng/L (ref <18)
Troponin I (High Sensitivity): 3199 ng/L (ref <18)
Troponin I (High Sensitivity): 4829 ng/L (ref <18)

## 2021-12-07 LAB — LIPID PANEL
Cholesterol: 134 mg/dL (ref 0–200)
HDL: 43 mg/dL (ref >40)
LDL Cholesterol: 81 mg/dL (ref 0–99)
Total CHOL/HDL Ratio: 3.1 RATIO
Triglycerides: 52 mg/dL (ref <150)
VLDL: 10 mg/dL (ref 0–40)

## 2021-12-07 LAB — MAGNESIUM: Magnesium: 2 mg/dL (ref 1.7–2.4)

## 2021-12-07 LAB — PROCALCITONIN: Procalcitonin: <0.1 ng/mL

## 2021-12-07 LAB — CBG MONITORING, ED
Glucose-Capillary: 216 mg/dL — ABNORMAL HIGH (ref 70–99)
Glucose-Capillary: 170 mg/dL — ABNORMAL HIGH (ref 70–99)
Glucose-Capillary: 131 mg/dL — ABNORMAL HIGH (ref 70–99)

## 2021-12-07 LAB — GLUCOSE, CAPILLARY
Glucose-Capillary: 118 mg/dL — ABNORMAL HIGH (ref 70–99)
Glucose-Capillary: 299 mg/dL — ABNORMAL HIGH (ref 70–99)

## 2021-12-07 LAB — PHOSPHORUS: Phosphorus: 5.3 mg/dL — ABNORMAL HIGH (ref 2.5–4.6)

## 2021-12-07 SURGERY — LEFT HEART CATH AND CORONARY ANGIOGRAPHY
Anesthesia: LOCAL

## 2021-12-07 MED ORDER — HYDRALAZINE HCL 20 MG/ML IJ SOLN
10.0000 mg | INTRAMUSCULAR | Status: AC | PRN
  Administered 2021-12-07 (×2): 10 mg via INTRAVENOUS

## 2021-12-07 MED ORDER — MIDAZOLAM HCL 2 MG/2ML IJ SOLN
INTRAMUSCULAR | Status: DC | PRN
  Administered 2021-12-07: 1 mg via INTRAVENOUS

## 2021-12-07 MED ORDER — VERAPAMIL HCL 2.5 MG/ML IV SOLN
INTRAVENOUS | Status: DC | PRN
  Administered 2021-12-07: 10 mL via INTRA_ARTERIAL

## 2021-12-07 MED ORDER — LABETALOL HCL 5 MG/ML IV SOLN: 10.0000 mg | INTRAVENOUS | Status: AC | PRN

## 2021-12-07 MED ORDER — HEPARIN BOLUS VIA INFUSION
4000.0000 [IU] | Freq: Once | INTRAVENOUS | Status: AC
  Administered 2021-12-07: 4000 [IU] via INTRAVENOUS
  Filled 2021-12-07: qty 4000

## 2021-12-07 MED ORDER — OXYCODONE HCL 5 MG PO TABS
5.0000 mg | ORAL_TABLET | ORAL | Status: DC | PRN
  Administered 2021-12-07: 5 mg via ORAL
  Filled 2021-12-07: qty 1

## 2021-12-07 MED ORDER — SODIUM CHLORIDE 0.9 % IV SOLN: 250.0000 mL | INTRAVENOUS | Status: DC | PRN

## 2021-12-07 MED ORDER — AZITHROMYCIN 250 MG PO TABS
500.0000 mg | ORAL_TABLET | Freq: Every day | ORAL | Status: AC
  Administered 2021-12-07 – 2021-12-10 (×4): 500 mg via ORAL
  Filled 2021-12-07 (×4): qty 2
  Filled 2021-12-07: qty 1

## 2021-12-07 MED ORDER — FENTANYL CITRATE (PF) 100 MCG/2ML IJ SOLN
INTRAMUSCULAR | Status: AC
  Filled 2021-12-07: qty 2

## 2021-12-07 MED ORDER — SODIUM CHLORIDE 0.9 % IV SOLN: INTRAVENOUS | Status: DC

## 2021-12-07 MED ORDER — HEPARIN SODIUM (PORCINE) 1000 UNIT/ML IJ SOLN
INTRAMUSCULAR | Status: AC
  Filled 2021-12-07: qty 10

## 2021-12-07 MED ORDER — VERAPAMIL HCL 2.5 MG/ML IV SOLN
INTRAVENOUS | Status: AC
  Filled 2021-12-07: qty 2

## 2021-12-07 MED ORDER — SODIUM CHLORIDE 0.9% FLUSH
3.0000 mL | Freq: Two times a day (BID) | INTRAVENOUS | Status: DC
  Administered 2021-12-07 – 2021-12-10 (×4): 3 mL via INTRAVENOUS

## 2021-12-07 MED ORDER — SODIUM CHLORIDE 0.9% FLUSH: 3.0000 mL | INTRAVENOUS | Status: DC | PRN

## 2021-12-07 MED ORDER — SODIUM CHLORIDE 0.9% FLUSH
3.0000 mL | Freq: Two times a day (BID) | INTRAVENOUS | Status: DC
  Administered 2021-12-07 – 2021-12-12 (×6): 3 mL via INTRAVENOUS

## 2021-12-07 MED ORDER — HEPARIN SODIUM (PORCINE) 1000 UNIT/ML IJ SOLN
INTRAMUSCULAR | Status: DC | PRN
  Administered 2021-12-07: 5000 [IU] via INTRAVENOUS

## 2021-12-07 MED ORDER — HEPARIN (PORCINE) 25000 UT/250ML-% IV SOLN
1200.0000 [IU]/h | INTRAVENOUS | Status: DC
  Administered 2021-12-07: 1200 [IU]/h via INTRAVENOUS
  Filled 2021-12-07: qty 250

## 2021-12-07 MED ORDER — HEPARIN (PORCINE) IN NACL 1000-0.9 UT/500ML-% IV SOLN
INTRAVENOUS | Status: DC | PRN
  Administered 2021-12-07 (×2): 500 mL

## 2021-12-07 MED ORDER — MIDAZOLAM HCL 2 MG/2ML IJ SOLN
INTRAMUSCULAR | Status: AC
  Filled 2021-12-07: qty 2

## 2021-12-07 MED ORDER — ASPIRIN 300 MG RE SUPP: 300.0000 mg | Freq: Once | RECTAL | Status: DC

## 2021-12-07 MED ORDER — IOHEXOL 350 MG/ML SOLN
INTRAVENOUS | Status: DC | PRN
  Administered 2021-12-07: 45 mL

## 2021-12-07 MED ORDER — LIDOCAINE HCL (PF) 1 % IJ SOLN
INTRAMUSCULAR | Status: DC | PRN
  Administered 2021-12-07: 2 mL

## 2021-12-07 MED ORDER — IPRATROPIUM-ALBUTEROL 0.5-2.5 (3) MG/3ML IN SOLN
3.0000 mL | Freq: Four times a day (QID) | RESPIRATORY_TRACT | Status: DC
  Administered 2021-12-07 (×2): 3 mL via RESPIRATORY_TRACT
  Filled 2021-12-07 (×3): qty 3

## 2021-12-07 MED ORDER — LIDOCAINE HCL (PF) 1 % IJ SOLN
INTRAMUSCULAR | Status: AC
  Filled 2021-12-07: qty 30

## 2021-12-07 MED ORDER — FENTANYL CITRATE (PF) 100 MCG/2ML IJ SOLN
INTRAMUSCULAR | Status: DC | PRN
  Administered 2021-12-07: 25 ug via INTRAVENOUS

## 2021-12-07 MED ORDER — ATORVASTATIN CALCIUM 80 MG PO TABS
80.0000 mg | ORAL_TABLET | Freq: Every day | ORAL | Status: DC
  Administered 2021-12-07 – 2021-12-13 (×7): 80 mg via ORAL
  Filled 2021-12-07 (×6): qty 1
  Filled 2021-12-07: qty 2

## 2021-12-07 MED ORDER — ASPIRIN 81 MG PO CHEW
81.0000 mg | CHEWABLE_TABLET | Freq: Every day | ORAL | Status: DC
  Administered 2021-12-07 – 2021-12-13 (×6): 81 mg via ORAL
  Filled 2021-12-07 (×6): qty 1

## 2021-12-07 MED ORDER — HEPARIN (PORCINE) 25000 UT/250ML-% IV SOLN
1400.0000 [IU]/h | INTRAVENOUS | Status: DC
  Administered 2021-12-07 – 2021-12-08 (×2): 1200 [IU]/h via INTRAVENOUS
  Administered 2021-12-09 – 2021-12-10 (×2): 1400 [IU]/h via INTRAVENOUS
  Filled 2021-12-07 (×4): qty 250

## 2021-12-07 MED ORDER — SODIUM CHLORIDE 0.9 % IV SOLN: INTRAVENOUS | Status: AC

## 2021-12-07 MED ORDER — HYDRALAZINE HCL 20 MG/ML IJ SOLN
INTRAMUSCULAR | Status: AC
  Filled 2021-12-07: qty 1

## 2021-12-07 MED ORDER — IPRATROPIUM-ALBUTEROL 0.5-2.5 (3) MG/3ML IN SOLN: 3.0000 mL | Freq: Four times a day (QID) | RESPIRATORY_TRACT | Status: DC | PRN

## 2021-12-07 MED ORDER — HYDROMORPHONE HCL 1 MG/ML IJ SOLN
0.5000 mg | INTRAMUSCULAR | Status: DC | PRN
  Administered 2021-12-07 (×2): 0.5 mg via INTRAVENOUS
  Filled 2021-12-07 (×2): qty 1

## 2021-12-07 MED ORDER — SODIUM CHLORIDE 0.9 % IV SOLN
2.0000 g | Freq: Every day | INTRAVENOUS | Status: DC
  Administered 2021-12-07 – 2021-12-09 (×3): 2 g via INTRAVENOUS
  Filled 2021-12-07 (×4): qty 20

## 2021-12-07 SURGICAL SUPPLY — 10 items
CATH 5FR JL3.5 JR4 ANG PIG MP (CATHETERS) IMPLANT
DEVICE RAD COMP TR BAND LRG (VASCULAR PRODUCTS) IMPLANT
ELECT DEFIB PAD ADLT CADENCE (PAD) IMPLANT
GLIDESHEATH SLEND SS 6F .021 (SHEATH) IMPLANT
GUIDEWIRE INQWIRE 1.5J.035X260 (WIRE) IMPLANT
INQWIRE 1.5J .035X260CM (WIRE) ×1
KIT HEART LEFT (KITS) ×1 IMPLANT
PACK CARDIAC CATHETERIZATION (CUSTOM PROCEDURE TRAY) ×1 IMPLANT
TRANSDUCER W/STOPCOCK (MISCELLANEOUS) ×1 IMPLANT
TUBING CIL FLEX 10 FLL-RA (TUBING) ×1 IMPLANT

## 2021-12-07 NOTE — Assessment & Plan Note (Signed)
No chest pain, but significant elevation of high sensitive troponin.  EKG with new left bundle branch block, echocardiogram with positive well motion abnormalities.   Plan to continue medical therapy with IV heparin Cardiac catheterization for coronary angiography.  Continue with aspirin and atorvastatin.

## 2021-12-07 NOTE — Assessment & Plan Note (Signed)
Systolic blood pressure 160 to 170 mmHg.  At home patient on losartan.  Continue with amlodipine and carvedilol. Added hydralazine 25 mg bid.

## 2021-12-07 NOTE — Progress Notes (Signed)
Austin Weber for Heparin  Indication: chest pain/ACS  Allergies  Allergen Reactions   Nitroglycerin     NOT an allergy - just FYI patient takes tadalafil regularly so please review last dose    Patient Measurements: Height: 6\' 1"  (185.4 cm) Weight: 92.5 kg (204 lb) IBW/kg (Calculated) : 79.9  Vital Signs: Temp: 97.6 F (36.4 C) (12/01 1249) Temp Source: Oral (12/01 1249) BP: 202/72 (12/01 1510) Pulse Rate: 113 (12/01 1510)  Labs: Recent Labs    12/06/21 1950 12/06/21 2100 12/06/21 2103 12/06/21 2145 12/07/21 0040 12/07/21 0201 12/07/21 0851  HGB 12.7*   < > 10.9*  --  10.7*  --  12.0*  HCT 39.7   < > 32.0*  --  33.5*  --  35.8*  PLT 198  --   --   --  130*  --  161  LABPROT 13.8  --   --   --   --   --   --   INR 1.1  --   --   --   --   --   --   CREATININE 4.13*  --   --   --  3.72*  --   --   TROPONINIHS 36*  --   --    < > 1,730* 3,199* 4,829*   < > = values in this interval not displayed.     Estimated Creatinine Clearance: 20 mL/min (A) (by C-G formula based on SCr of 3.72 mg/dL (H)).   Assessment: 21 YOM s/p cath with consideration for staged PCI of ostial RCA and plan to treat the circumflex medically.  Pharmacy consulted to resume IV heparin 2 hours post radial band removal.  Radial band removed around 1700 per discussion with RN; no bleeding nor hematoma observed.  Goal of Therapy:  Heparin level 0.3-0.7 units/ml Monitor platelets by anticoagulation protocol: Yes   Plan:  At 1900, resume heparin drip at 1200 units/hr Check 8 hour heparin level Daily heparin level and CBC Monitor for bleeding/hematoma  Nyquan Selbe D. Mina Marble, PharmD, BCPS, [place] 12/07/2021, 5:18 PM

## 2021-12-07 NOTE — Consult Note (Addendum)
Cardiology Consultation   Patient ID: Austin Weber MRN: [id number]; DOB: [birth year]  Admit date: 12/06/2021 Date of Consult: 12/07/2021  PCP:  Dettinger, Fransisca Kaufmann, MD   [facility] Providers Cardiologist:  None        Patient Profile:   Austin Weber is a 71 y.o. male with a hx of CAD with MI 1999 (details unclear), inferior MI s/p PTCA/stenting to RCA with staged DES to LAD, ICM/prior HFrEF (EF 30-35% in 2012, unclear if transient or chronic), poorly controlled HTN, HLD, CKD stage 3b-4 (recent progression), heavy proteinuria (pt previously declined bx), hyperkalemia, DM, tobacco abuse who is being seen 12/07/2021 for the evaluation of chest pain, elevated troponin, new LBBB at the request of Dr. Nevada Crane.  History of Present Illness:   Mr. Fults had remote MI in 1999, details unclear, then was admitted by Dr. Terrence Dupont back in 2012 with inferior MI with PCI as above. EF reported to be 30-35% by LV gram at that time. We do not have a subsequent echo on file. He reports he has not been following with any cardiologist since then. He has recently been following with Dr. Theador Hawthorne as for progressive kidney failure. In 10/2021, he was advised to go to ER due to severe HTN but had declined. Losartan was discontinued at that time due to worsening renal function and hyperkalemia, but at that visit patient then informed Dr. Theador Hawthorne he had actually stopped all of his blood pressure medicines. Note states, "I then discussed the patient will stop his amlodipine siphoning with the pain. Stop his terazosin and start him on hydralazine." In f/u 12/01/21, BP was still 168/82 so he was started on chlorthalidone. He has been trying to quit smoking after 40-50 years of use, down to 5-6 cigarettes a day. Denies ETOH or drug use.  He reports he's been in [place] lately and taking meds as prescribed. The medicine list in Epic does not appear to have been reconciled yet so does not reflect recent nephrology  changes above. Of note he does take tadalafil every single day and believes he last did so at 6pm on 11/29 so would avoid NTG going forward for 48 hours. Yesterday around 4pm he developed sudden onset of chest pain across his whole chest to his back associated with SOB. He chewed 4 baby aspirin around 4:30pm. Given persistence of symptoms he called EMS. Per triage notes, EMS noted prominent wheezing and hypoxia at 78% RA, given albuterol and 2 SL NTG, required CPAP then BiPAP with improvement in respiratory distress and resolution of chest pain. He was reported to be tachycardic and hypertensive. Initial HR here 120s-130s, BP 148/94. EKG showed presumed sinus tachycardia, difficult to ascertain P waves, LBBB, nonspecific STT changes. Labs demonstrate BUN 62, Cr 4.13 -> 3.72 (up from 3.11 in 10/2021, previously around the 2.1-2.2 range), BNP 509, hsTroponin 36->373->1730->3199, pro-calcitonin <0.10, Hgb 12.7 -> 10.7, Plt 130, albumin 2.8, K 4.5, Mg 2.0, initial glucose 353, Covid/flu neg. He received abx, initially started on fluids but d/c'd soon after, and IV heparin per pharmacy. CXR c/w R basilar PNA, cardiac shadow upper normal limits. He denies any recent fevers, chills, weight loss, cough, or any recent chest pain otherwise. Dr. Nevada Crane contacted our team earlier this AM to review with Dr. Audie Box who did not feel this represented STEMI, but cardiology asked to see patient in consultation so seen urgently. He is now de-escalated to [place] and asking if he can go home. He denies any current chest pain.  Repeat EKG confirms sinus tach with LBBB though on telemetry he appears to be alternating between narrow complex rhythm and alternating axes, possible intermittent RBBB versus PVCs.   Past Medical History:  Diagnosis Date   CAD (coronary artery disease)    Chronic kidney disease    Diabetes mellitus ([place])    Hyperkalemia    Hyperlipidemia    Hypertension    Ischemic cardiomyopathy    EF 30-35% in 2012    Proteinuria     Past Surgical History:  Procedure Laterality Date   SHOULDER SURGERY Left      Home Medications:  Prior to Admission medications   Medication Sig Start Date End Date Taking? Authorizing Provider  amLODipine (NORVASC) 10 MG tablet Take 1 tablet (10 mg total) by mouth daily. 02/09/21   Dettinger, Fransisca Kaufmann, MD  aspirin EC 81 MG tablet Take 81 mg by mouth 2 (two) times daily.    [provider]  atorvastatin (LIPITOR) 20 MG tablet Take 1 tablet (20 mg total) by mouth daily. 02/09/21   Dettinger, Fransisca Kaufmann, MD  Blood Glucose Monitoring Suppl [facility] VERIO FLEX SYSTEM) w/Device KIT USE AS DIRECTED 09/13/21   Dettinger, Fransisca Kaufmann, MD  carvedilol (COREG) 25 MG tablet Take 25 mg by mouth 2 (two) times daily. 03/09/21   [provider]  fluticasone (FLONASE) 50 MCG/ACT nasal spray Place 1 spray into both nostrils 2 (two) times daily as needed for allergies or rhinitis. 06/11/21   Dettinger, Fransisca Kaufmann, MD  glipiZIDE (GLUCOTROL XL) 10 MG 24 hr tablet Take 1 tablet (10 mg total) by mouth 2 (two) times daily. 02/09/21   Dettinger, Fransisca Kaufmann, MD  losartan (COZAAR) 100 MG tablet Take 1 tablet (100 mg total) by mouth daily. 02/09/21   Dettinger, Fransisca Kaufmann, MD  OneTouch Delica Lancets 26Z MISC TWICE DAILY 09/13/21   Dettinger, Fransisca Kaufmann, MD  [facility] VERIO test strip TWICE DAILY 09/13/21   Dettinger, Fransisca Kaufmann, MD  tadalafil (CIALIS) 5 MG tablet TAKE ONE (1) TABLET EACH DAY 09/13/21   Dettinger, Fransisca Kaufmann, MD  terazosin (HYTRIN) 2 MG capsule Take 1 capsule (2 mg total) by mouth at bedtime. 02/09/21   Dettinger, Fransisca Kaufmann, MD    Inpatient Medications: Scheduled Meds:  aspirin  81 mg Oral Daily   atorvastatin  80 mg Oral Daily   insulin aspart  0-9 Units Subcutaneous Q4H   ipratropium-albuterol  3 mL Nebulization Q6H   sodium chloride flush  3 mL Intravenous Q12H   Continuous Infusions:  sodium chloride     sodium chloride     cefTRIAXone (ROCEPHIN)  IV     doxycycline (VIBRAMYCIN) IV      heparin 1,200 Units/hr (12/07/21 0706)   PRN Meds: sodium chloride, albuterol, sodium chloride flush  Allergies:    Allergies  Allergen Reactions   Nitroglycerin     NOT an allergy - just FYI patient takes tadalafil regularly so please review last dose    Social History:   Social History   Socioeconomic History   Marital status: Legally Separated    Spouse name: Raydel Hosick   Number of children: 1   Years of education: 12   Highest education level: 12th grade  Occupational History   Occupation: Retired  Tobacco Use   Smoking status: Every Day    Packs/day: 0.50    Years: 40.00    Total pack years: 20.00    Types: Cigarettes    Passive exposure: Current   Smokeless tobacco: Never  Tobacco comments:    down from 2 ppd  Vaping Use   Vaping Use: Never used  Substance and Sexual Activity   Alcohol use: Never   Drug use: Never   Sexual activity: Not Currently    Partners: Female  Other Topics Concern   Not on file  Social History Narrative   Lives alone. Lots of family nearby   Social Determinants of Health   Financial Resource Strain: Low Risk  (08/08/2021)   Overall Financial Resource Strain (CARDIA)    Difficulty of Paying Living Expenses: Not hard at all  Food Insecurity: No Food Insecurity (08/08/2021)   Hunger Vital Sign    Worried About Running Out of Food in the Last Year: Never true    Ran Out of Food in the Last Year: Never true  Transportation Needs: No Transportation Needs (08/08/2021)   PRAPARE - Hydrologist (Medical): No    Lack of Transportation (Non-Medical): No  Physical Activity: Sufficiently Active (08/08/2021)   Exercise Vital Sign    Days of Exercise per Week: 5 days    Minutes of Exercise per Session: 60 min  Stress: No Stress Concern Present (08/08/2021)   [place]    Feeling of Stress : Only a little  Social Connections: Moderately Isolated  (08/08/2021)   Social Connection and Isolation Panel [NHANES]    Frequency of Communication with Friends and Family: More than three times a week    Frequency of Social Gatherings with Friends and Family: More than three times a week    Attends Religious Services: Never    Marine scientist or Organizations: No    Attends Archivist Meetings: Never    Marital Status: Married  Human resources officer Violence: Not At Risk (08/08/2021)   Humiliation, Afraid, Rape, and Kick questionnaire    Fear of Current or Ex-Partner: No    Emotionally Abused: No    Physically Abused: No    Sexually Abused: No    Family History:    Family History  Problem Relation Age of Onset   Heart disease Father      ROS:  Please see the history of present illness.  All other ROS reviewed and negative.     Physical Exam/Data:   Vitals:   12/07/21 0628 12/07/21 0710 12/07/21 0725 12/07/21 0749  BP:   (!) 167/79   Pulse: (!) 106  (!) 104   Resp:   18   Temp:      TempSrc:      SpO2: 96% 96% 94% 95%  Weight:      Height:        Intake/Output Summary (Last 24 hours) at 12/07/2021 0839 Last data filed at 12/07/2021 6967 Gross per 24 hour  Intake 629.68 ml  Output 1100 ml  Net -470.32 ml      12/06/2021    8:02 PM 10/17/2021    8:07 AM 06/11/2021    7:57 AM  Last 3 Weights  Weight (lbs) 204 lb 204 lb 201 lb  Weight (kg) 92.534 kg 92.534 kg 91.173 kg     Body mass index is 26.91 kg/m.  General: Well developed, well nourished, in no acute distress. Head: Normocephalic, atraumatic, sclera non-icteric, no xanthomas, nares are without discharge. Neck: Negative for carotid bruits. JVP not elevated. Lungs: Coarse BS throughout with rare expiratory wheezing, occasional rhonchi cleared with coughing. No acute rales. Breathing is unlabored. Heart: Tachycardic, regular,  S1 S2 without murmurs, rubs, or gallops.  Abdomen: Soft, non-tender, non-distended with normoactive bowel sounds. No  rebound/guarding. Extremities: No clubbing or cyanosis. Trace BLE edema. Distal pedal pulses are 2+ and equal bilaterally. Neuro: Alert and oriented X 3. Moves all extremities spontaneously. Psych:  Responds to questions appropriately with a normal affect.   EKG:  The EKG was personally reviewed and demonstrates:  Presumed sinus tachycardia, difficult to ascertain P waves, LBBB, nonspecific STT changes Telemetry:  Telemetry was personally reviewed and demonstrates:  outlined above  Relevant CV Studies: Staged PCI 05/14/10 PROCEDURE: 1. Left cardiac cath with selective left and right coronary     angiography via right groin using Judkins technique. 2. Successful PTCA to proximal and mid junction of LAD using 2.5 x 8-     mm long mini Trek balloon. 3. Successful deployment of 3.0 x 16-mm long thrombosed element Plus     drug-eluting stent in proximal and mid junction of LAD. 4. Successful postdilatation of this stent using 3.5 x 12-mm long [place]     Trek balloon going up to 20 atmospheric pressure.   INDICATIONS FOR PROCEDURE:  Mr. Carrizales is a 71 year old white male with past medical history significant for coronary artery disease, history of MI approximately 13 years ago, hypertension, non-insulin-dependent diabetes mellitus, hypercholesteremia, morbid obesity, tobacco abuse, positive family history of coronary artery disease who was admitted on May 09, 2010, as code STEMI was called and the patient was noted to have inferior wall MI requiring emergency PCI to mid RCA.  The patient was noted to have critical stenosis in proximal LAD.  The patient is brought to the cath lab for staged PCI to LAD.   After obtaining the informed consent, the patient was brought to the cath lab and was placed on fluoroscopy table.  Right groin was prepped and draped in usual fashion.  Xylocaine 1% was used for local anesthesia in the right groin.  With the help of thin-wall needle, 6-French arterial sheath  was placed.  The sheath was aspirated and flushed. Next, a 6-French right Judkins catheter was advanced over the wire under fluoroscopic guidance up to the ascending aorta.  Wire was pulled out, the catheter was aspirated and connected to the manifold.  Catheter was further advanced and engaged into right coronary ostium.  The single view of right coronary artery was obtained.  Next, the catheter was disengaged and was pulled out over the wire and was replaced with 6- [place] XB LAD guiding catheter, which was advanced over the wire under fluoroscopic guidance up to the ascending aorta.  Wire was pulled out, the catheter was aspirated and connected to the manifold.  Catheter was further advanced and engaged into left coronary ostium.  Multiple views of the left system were taken.   FINDINGS:  RCA was patent at prior PTCA and stented site.  LAD had 75- 80% proximal and mid junction stenosis as before.   INTERVENTIONAL PROCEDURE:  Successful PTCA to proximal and mid junction of LAD was done using 2.5 x 8-mm long mini Trek balloon for predilatation and then 3.0 x 16-mm long thrombosed element Plus drug- eluting stent was deployed in proximal and mid junction of LAD at 11 atmospheric pressure.  Stent was postdilated using 3.5 x 12-mm long [place] Trek balloon going up to 20 atmospheric pressure.  Lesion dilated from 75-80% to 0% residual with excellent TIMI grade 3 distal flow without evidence of dissection or distal embolization.  The patient received weight-based Angiomax and  30 mg of prasugrel during the procedure.  Arteriotomy was brought by [place] without difficulty with good hemostasis.  The patient tolerated procedure well.  There were no complications.  The patient was transferred to recovery room in stable condition.   Allegra Lai. Terrence Dupont, M.D.  Cath 05/09/10 PROCEDURES: 1. Left cardiac cath with selective left and right coronary     angiography via right groin using Judkins  technique. 2. Successful PTCA to mid RCA using 2.5 x 12 mm long mini Trek     balloon. 3. Successful deployment of 4.0 x 24 mm long Promus Element drug-     eluting stent in mid RCA. 4. Successful postdilatation of this stent using 4.0 x 15 mm long [place]     Trek balloon.   INDICATIONS FOR PROCEDURE:  Mr. Rahmel Nedved is a 71 year old white male with past medical history significant for coronary artery disease, history of MI approximately 13 years ago, hypertension, non-insulin- dependent diabetes mellitus, hypercholesteremia, morbid obesity, tobacco abuse, positive family history of coronary artery disease.  Father died at the age of 50 due to MI.  He came to the [place] from [facility] by EMS, complaining of recurrent retrosternal chest pain, radiating to the left arm, associated with diaphoresis and mild shortness of breath since 1 a.m.  He states chest pain resolved in 15-20 minutes, did not seek medical attention, and again this afternoon had similar chest pain associated with nausea, diaphoresis, chest pain radiating to the left shoulder and left arm.  He took 4 baby aspirin with relief of chest pain.  He states stopped all his medications except metformin and aspirin.   PAST MEDICAL HISTORY:  As above.   PAST SURGICAL HISTORY:  He had left shoulder surgery in the past.   ALLERGIES:  No known drug allergies.   MEDICATIONS AT HOME:  He takes aspirin and metformin.   SOCIAL HISTORY:  He is married, smokes 1-1/2 pack per day.  No history of alcohol abuse.  Works as Games developer.   FAMILY HISTORY:  Positive for coronary artery disease,   PHYSICAL EXAMINATION:  GENERAL:  He was alert, awake, and oriented x3, in no acute distress. VITAL SIGNS:  Blood pressure was 154/90, pulse was 96 and regular. HEENT:  Conjunctivae pink. NECK:  Supple.  No JVD.  No bruit. LUNGS:  Clear to auscultation. CARDIOVASCULAR:  S1 and S2 was normal.  There was soft systolic  murmur and S4 gallop. ABDOMEN:  Soft.  Bowel sounds were present, nontender. EXTREMITIES:  There is no clubbing, cyanosis, or edema.   EKG done on the field showed sinus tachycardia with minimal ST elevation in inferior leads with no significant reciprocal changes, also had poor R-wave progression in V1-V3.  Discussed with the patient regarding emergency left cath, possible PTCA stenting, its risks and benefits i.e. death, MI, stroke, need for emergency CABG, local vascular complications, etc., and consented for PCI.   DESCRIPTION OF PROCEDURE:  After obtaining the informed consent, the patient was brought to the cath lab and was placed on fluoroscopy table. The right groin was prepped and draped in usual fashion.  A 1% Xylocaine was used for local anesthesia in the right groin.  With the help of thin- wall needle, a 6-French arterial sheath was placed.  The sheath was aspirated and flushed.  Next, a 6-French right Judkins catheter was advanced over the wire under fluoroscopic guidance up to the ascending aorta.  Wire was pulled out.  The catheter was  aspirated and connected to the manifold.  Catheter was further advanced and engaged into right coronary ostium.  The single view of right coronary artery was obtained. Next, the catheter was disengaged and was pulled out over the wire and was replaced with 6-French left Judkins catheter which was advanced over the wire under fluoroscopic guidance up to the ascending aorta.  Wire was pulled out.  The catheter was aspirated and connected to the manifold.  Catheter was further advanced and engaged into left coronary ostium.  Multiple views of the left system were taken.  Next, the catheter was disengaged and was pulled out over the wire and was replaced with 6-French pigtail catheter which was advanced over the wire under fluoroscopic guidance up to the ascending aorta.  At the end of the procedure, catheter was further advanced across the  aortic valve into the LV.  LV pressures were recorded.  Next, LV graft was done in 30- degree RAO position.  Post angiographic pressures were recorded from LV and then pullback pressures were recorded from aorta.  There was no gradient across aortic valve.  Next, the pigtail catheter was pulled out over the wire.  Sheaths were aspirated and flushed.   FINDINGS:  LV showed anterolateral and inferior wall hypokinesia, LVH, EF of 30-35%.  Left main was patent.  The LAD has 20-25% proximal stenosis and 70-75% proximal and mid junction focal stenosis.  Diagonal 1 is small which has 30-40% stenosis.  Diagonal 2 and 3 were very small. Left circumflex has 30-40% proximal stenosis.  OM-1 is very, very small. OM-2 is moderate size which has 20% proximal and 30% mid stenosis.  RCA has 20-25% proximal stenosis and 95-99% mid stenosis with ruptured plaque with haziness with TIMI grade 2 plus distal flow.   INTERVENTIONAL PROCEDURE:  Successful PTCA to mid RCA was done using 2.5 x 12 mm long mini Trek balloon for predilatation and then 4.0 x 24 mm long Promus Element Plus drug-eluting stent was deployed at 11 atmospheric pressure.  Stent was postdilated using 4.0 x 15 mm long [place] Trek balloon, going up to 15 atmospheric pressure.  Lesion dilated from 95 to 99% with ruptured plaque and thrombosed to 0% residual with excellent TIMI grade 3 distal flow without evidence of dissection or distal embolization.  The patient received weight-based Angiomax and 60 mg of Effient and also 5 mg of IV Lopressor q.5 minutes x2 during the procedure.  The patient tolerated procedure well.  There were no complications.  The patient was transferred to CCU in stable condition. Total balloon time was 24 minutes.    Allegra Lai. Terrence Dupont, M.D.      Laboratory Data:  High Sensitivity Troponin:   Recent Labs  Lab 12/06/21 1950 12/06/21 2145 12/07/21 0040 12/07/21 0201  TROPONINIHS 36* 373* 1,730* 3,199*      Chemistry Recent Labs  Lab 12/06/21 1950 12/06/21 2100 12/06/21 2103 12/07/21 0040  NA 138 138 136 140  K 4.6 4.5 4.7 4.5  CL 110  --   --  108  CO2 18*  --   --  17*  GLUCOSE 353*  --   --  289*  BUN 62*  --   --  63*  CREATININE 4.13*  --   --  3.72*  CALCIUM 8.7*  --   --  9.0  MG  --   --   --  2.0  GFRNONAA 14*  --   --  16*  ANIONGAP 10  --   --  15    Recent Labs  Lab 12/07/21 0040  PROT 5.5*  ALBUMIN 2.8*  AST 23  ALT 20  ALKPHOS 57  BILITOT 0.2*   Lipids No results for input(s): "CHOL", "TRIG", "HDL", "LABVLDL", "LDLCALC", "CHOLHDL" in the last 168 hours.  Hematology Recent Labs  Lab 12/06/21 1950 12/06/21 2100 12/06/21 2103 12/07/21 0040  WBC 14.5*  --   --  9.4  RBC 4.07*  --   --  3.45*  HGB 12.7* 12.6* 10.9* 10.7*  HCT 39.7 37.0* 32.0* 33.5*  MCV 97.5  --   --  97.1  MCH 31.2  --   --  31.0  MCHC 32.0  --   --  31.9  RDW 12.2  --   --  12.4  PLT 198  --   --  130*   Thyroid No results for input(s): "TSH", "FREET4" in the last 168 hours.  BNP Recent Labs  Lab 12/06/21 1950  BNP 509.9*    DDimer No results for input(s): "DDIMER" in the last 168 hours.   Radiology/Studies:  DG Chest Portable 1 View  Result Date: 12/06/2021 CLINICAL DATA:  Chest pain and shortness of breath EXAM: PORTABLE CHEST 1 VIEW COMPARISON:  None FINDINGS: Cardiac shadow is at the upper limits of normal in size. Diffuse right basilar infiltrate is noted consistent with pneumonia. No sizable effusion is seen. No bony abnormality is noted. IMPRESSION: Right basilar pneumonia Electronically Signed   By: Inez Catalina M.D.   On: 12/06/2021 20:29     Assessment and Plan:   1. Acute hypoxic respiratory failure 2. Suspect acute myocardial infarction with remote hx of CAD s/p MI 1999, MI 2012 s/p PCI to RCA/LAD 3. Possible chronic HFrEF - EF 30-35% in 2012 without reassessment available 4. New LBBB with alternating axis on telemetry, occasional narrow complex rhythm as  well 5. Essential HTN, historically poorly controlled 6. Possible RLL CAP 7. AKI on recent progressive CKD stage IV 8. Mild anemia without reported bleeding 9. Hyperlipidemia  10. NIDDM  Patient seen expeditiously given abnormal EKG with new LBBB and rising troponin. He remains chest pain free but clinical scenario is concerning to me for acute MI with possible early decompensated CHF (superimposed on possible COPD with longstanding tobacco abuse, still smoking 5-6 cigarettes per day). Symptoms resolved with BiPAP and SL NTG. Of note, he reports he last took tadalafil on 11/29 at 6pm so prudent to avoid NTG going forward (perhaps a full 48 hours in case he took with his AM meds yesterday as he also takes a batch at [employer]). Echo dept was contacted for stat echo. Since he took 326m ASA yesterday at 4:30pm, cancelled order for rectal ASA but will start 832mdaily. Start atorvastin 8017maily. Continue heparin per pharmacy. Ordered pharmacy team to please review home med rec. Would avoid ACEi, ARB, ARNI, spironolactone due to worsening renal function and h/o hypoerkalemia. Will recheck troponin now along with f/u CBC given declining Hgb/plt count of unclear etiology. Very difficult situation with both concern for heart and kidneys. Dr. BerGwenlyn Foundme to assess at bedside and had long conversation with patient about concern for coronary ischemia. Echo at bedside showed moderate-severe LV dysfunction per his preliminary review. They discussed that cardiac cath would not be without risk to the kidney function and that there is a chance of progressive kidney failure and dialysis. The patient states he has not wanted to go on dialysis in the future but also understands that he could  potentially die due to heart reasons given the clinical scenario. Per their shared decision making, the patient has agreed to cardiac cath with limited dye exposure and no LV gram. Per d/w Dr. Gwenlyn Found, will hold off beta blocker given the  telemetry findings with alternating axis as well as potential acute decompensation - we will give limited fluids in prep for cath at 50cc/hr. We have asked IM to consult nephrology to follow along. Dissection considered given the quality of initial symptoms but felt less likely given normal mediastinal contours and complete resolution of symptoms. Further recs forthcoming pending cath. Will defer abx management to IM.  Risk Assessment/Risk Scores:     TIMI Risk Score for Unstable Angina or Non-ST Elevation MI:   The patient's TIMI risk score is 5, which indicates a 26% risk of all cause mortality, new or recurrent myocardial infarction or need for urgent revascularization in the next 14 days.  New York Heart Association (NYHA) Functional Class NYHA Class IV on arrival        For questions or updates, please contact [place] Please consult [email or web address] for contact info under    Signed, Charlie Pitter, PA-C  12/07/2021 8:39 AM  Agree with findings by Burna Mortimer  We are asked to see Mr. Boniface for acute coronary syndrome/non-STEMI.  He is a 71 year old African-American male with a prior history of CAD status post RCA and LAD stenting remotely.  He has known LV dysfunction, ongoing tobacco abuse, treated diabetes, hypertension and hyperlipidemia.  He has not been seen in follow-up for many years by a cardiologist.  He developed chest pain yesterday afternoon with progressive dyspnea and EMS was called.  He was given several nitroglycerin which ultimately resulted in resolution of his chest pain.  In the ER he is currently pain-free.  His EKG shows new left bundle branch block.  His troponins are progressively increasing from 36 when he arrived to 3200.  He has intermittent left bundle branch block.  His serum creatinine is approximately 4.  He is followed by nephrologist as an outpatient.  His exam is benign.  He has no peripheral edema.  Lungs are clear.  He has no history  jugulovenous pressure.  I am concerned that in the last 12 years since his intervention that he developed diffuse CAD.  Given the rising troponin level and intermittent left bundle branch block I feel he needs cor onary angiography limit the contrast.  We discussed the possibility of acute kidney injury potentially requiring dialysis although patient adamantly says that he does not want to go on dialysis.  This poses a dilemma although I think it is risk of having a more serious coronary event given the scenario is fairly high.  We decided to proceed with diagnostic coronary angiography using shared decision making.  Lorretta Harp, M.D., [place], [facility], [facility] [address] [place], [place]  [address]  [phone] 12/07/2021 9:09 AM

## 2021-12-07 NOTE — Assessment & Plan Note (Addendum)
CKD stage 3 b (base cr at 39 old records personally reviewed).  Patient received gently IV fluids (bicarb drip for contrast induced nephropathy prophylaxis).   Follow up renal function with serum cr at 3.18 with K at 4,1 and serum bicarbonate at 19. Plan to continue close follow up renal function and electrolytes.  Avoid hypotension or nephrotoxic medications.  Holding diuretic therapy for now.

## 2021-12-07 NOTE — Progress Notes (Signed)
ANTICOAGULATION CONSULT NOTE - Initial Consult  Pharmacy Consult for Heparin  Indication: chest pain/ACS  No Known Allergies  Patient Measurements: Height: 6\' 1"  (185.4 cm) Weight: 92.5 kg (204 lb) IBW/kg (Calculated) : 79.9  Vital Signs: Temp: 98.4 F (36.9 C) (12/01 0615) Temp Source: Axillary (12/01 0615) BP: 177/74 (12/01 0615) Pulse Rate: 106 (12/01 0628)  Labs: Recent Labs    12/06/21 1950 12/06/21 2100 12/06/21 2103 12/06/21 2145 12/07/21 0040 12/07/21 0201  HGB 12.7* 12.6* 10.9*  --  10.7*  --   HCT 39.7 37.0* 32.0*  --  33.5*  --   PLT 198  --   --   --  130*  --   LABPROT 13.8  --   --   --   --   --   INR 1.1  --   --   --   --   --   CREATININE 4.13*  --   --   --  3.72*  --   TROPONINIHS 36*  --   --  373* 1,730* 3,199*    Estimated Creatinine Clearance: 20 mL/min (A) (by C-G formula based on SCr of 3.72 mg/dL (H)).   Medical History: Past Medical History:  Diagnosis Date   CAD (coronary artery disease)    Chronic kidney disease    Diabetes mellitus (HCC)    Hyperkalemia    Hyperlipidemia    Hypertension    Proteinuria      Assessment: 71 y/o M with respiratory distress, now with increasing troponin, starting heparin, labs above reviewed, PTA meds reviewed.   Goal of Therapy:  Heparin level 0.3-0.7 units/ml Monitor platelets by anticoagulation protocol: Yes   Plan:  Heparin 4000 units BOLUS Start heparin drip at 1200 units/hr 8 hour heparin level Daily CBC/Heparin level Monitor for bleeding  Narda Bonds, PharmD, BCPS Clinical Pharmacist Phone: [phone]

## 2021-12-07 NOTE — Progress Notes (Signed)
Progress Note   Patient: Austin Weber PRF:[id number] DOB: [birth year] DOA: 12/06/2021     1 DOS: the patient was seen and examined on 12/07/2021   Brief hospital course: Mr. Flott was admitted to the hospital with the working diagnosis of community acquired pneumonia, complicated with respiratory failure.   71 yo male with the past medical history of CKD stage 3b, T2DM, heart failure and hypertension who presented with dyspnea. Patient at home developed severe dyspnea, EMS was called and he was found in respiratory distress with 02 saturation in the 70's. Patient was transported the ED and was placed on non invasive mechanical ventilation. His blood pressure in the ED 148/77. HR 104, RR 22 and 02 saturation 98% on bipap. Lungs with rales but not wheezing. Heart with S1 and S2 present and tachycardic with no gallops, abdomen with no distention, no lower extremity edema.   ABG 7,24/ 45/ 207/ 21/ 100% Na 138, K 4,6 Cl 110, bicarbonate 18, glucose 353 BUN 62, CR 4.13 BNP 509  High sensitive troponin 36, 373, 1,730, 3,199, 4,829  Wbc 14,5 hgb 12.0 plt 198  Sars covid 19 negative   Chest radiograph with cardiomegaly with dense right lower lobe patchy infiltrate with positive air bronchogram.   EKG 131 bpm, normal axis, left bundle branch block, SVT with no significant ST segment or T wave changes   12/01 Patient placed on broad spectrum antibiotic therapy and heparin drip. Cardiac catheterization.   Assessment and Plan: * CAP (community acquired pneumonia) Patient with right lower lobe infiltrate. Acute hypoxemic and hypercapnic respiratory failure.  Severe sepsis present on admission.   Patient now off Bipap, dyspnea has improved.  02 saturation is 95% on 2 L/min per [place]   Plan to continue antibiotic therapy with IV ceftriaxone and oral azithromycin. Oxymetry monitoring and supplemental 02 per [place] to keep 02 saturation 92% or greater. As needed bronchodilator therapy Airway clearing  techniques with flutter valve and incentive spirometer.   NSTEMI (non-ST elevated myocardial infarction) (HCC) No chest pain, but significant elevation of high sensitive troponin.  EKG with new left bundle branch block, echocardiogram with positive well motion abnormalities.   Plan to continue medical therapy with IV heparin Cardiac catheterization for coronary angiography.  Continue with aspirin and atorvastatin.   Acute kidney injury superimposed on chronic kidney disease ([place]) CKD stage 3 b (base cr at 94 old records personally reviewed).   Renal function with serum cr at 3,72 with K at 4,5 and serum bicarbonate at 17.  P 5.3  Pre cardiac catheterization isotonic saline 50 ml per hr Patient at risk of worsening renal function, follow up renal function in am, avoid hypotension and nephrotoxic medications.   Hypertension associated with diabetes ([place]) Continue blood pressure monitoring   Type 2 diabetes mellitus with hyperlipidemia (HCC) Continue insulin sliding scale for glucose cover and monitoring.  Continue with statin therapy.    Cigarette nicotine dependence without complication Smoking cessation counseling.         Subjective: Patient with no chest pain or dyspnea, no nausea or vomiting.   Physical Exam: Vitals:   12/07/21 1419 12/07/21 1424 12/07/21 1429 12/07/21 1434  BP: (!) 174/70 (!) 172/78 (!) 161/73 (!) 166/70  Pulse: (!) 102 94 97 96  Resp: (!) 22 15 (!) 34 (!) 27  Temp:      TempSrc:      SpO2: 93% 94% 93% 93%  Weight:      Height:       Neurology  awake and alert ENT with mild pallor Cardiovascular with S1 and S2 present and rhythmic with no gallops, rubs or murmurs Respiratory with rales at the right base with no wheezing or rhonchi Abdomen with no distention  No lower extremity edema  Data Reviewed:    Family Communication: I spoke with patient's son at the bedside, we talked in detail about patient's condition, plan of care and prognosis  and all questions were addressed.   Disposition: Status is: Inpatient Remains inpatient appropriate because: sepsis, NSTEMI  Planned Discharge Destination: Home      Author: Tawni Millers, MD 12/07/2021 2:40 PM  For on call review [email or web address].

## 2021-12-07 NOTE — Assessment & Plan Note (Signed)
Echocardiogram with reduced LV systolic function 35 to 03%, moderate LVH, RV systolic function preserved. Small pericardial effusion with mild RV diastolic collapse. No significant valvular disease. Akinesis of the inferolateral wall with moderate LV dysfunction.  No signs of volume overload Continue medical therapy for acute coronary syndrome Systolic blood pressure 524 to 170 mmHg. Limited pharmacologic options due to reduced GFR. Continue with carvedilol.

## 2021-12-07 NOTE — Progress Notes (Signed)
TRH night cross cover note:   I was notified by RN that the patient was experiencing some post cardiac cath chest discomfort, significantly improved with dose of IV Dilaudid.  Systolic blood pressure 335'K mmHg.  Heart rate in the 1 -teens, similar to earlier this afternoon.  EKG obtained at the time of the patient's chest discomfort, and showed sinus tachycardia with known left bundle branch block and PAC, with heart rate 117, and no overt new T wave/ST changes. Per chart review, patient underwent left-sided cardiac cath this afternoon for NSTEMI. On heparin drip.     Babs Bertin, DO Hospitalist

## 2021-12-07 NOTE — Interval H&P Note (Signed)
History and Physical Interval Note:  12/07/2021 2:05 PM  Austin Weber  has presented today for surgery, with the diagnosis of nstemi.  The various methods of treatment have been discussed with the patient and family. After consideration of risks, benefits and other options for treatment, the patient has consented to  Procedure(s): LEFT HEART CATH AND CORONARY ANGIOGRAPHY (N/A) as a surgical intervention.  The patient's history has been reviewed, patient examined, no change in status, stable for surgery.  I have reviewed the patient's chart and labs.  Questions were answered to the patient's satisfaction.    Cath Lab Visit (complete for each Cath Lab visit)  Clinical Evaluation Leading to the Procedure:   ACS: Yes.    Non-ACS:    Anginal Classification: CCS III  Anti-ischemic medical therapy: Maximal Therapy (2 or more classes of medications)  Non-Invasive Test Results: No non-invasive testing performed  Prior CABG: No previous CABG        Lauree Chandler

## 2021-12-07 NOTE — Progress Notes (Signed)
  Echocardiogram 2D Echocardiogram has been performed.  Austin Weber 12/07/2021, 8:35 AM

## 2021-12-07 NOTE — H&P (View-Only) (Signed)
Cardiology Consultation   Patient ID: Austin Weber MRN: [id number]; DOB: 09-04-1950  Admit date: 12/06/2021 Date of Consult: 12/07/2021  PCP:  Dettinger, Fransisca Kaufmann, MD   [place] Providers Cardiologist:  None        Patient Profile:   Austin Weber is a 71 y.o. male with a hx of CAD with MI 1999 (details unclear), inferior MI s/p PTCA/stenting to RCA with staged DES to LAD, ICM/prior HFrEF (EF 30-35% in 2012, unclear if transient or chronic), poorly controlled HTN, HLD, CKD stage 3b-4 (recent progression), heavy proteinuria (pt previously declined bx), hyperkalemia, DM, tobacco abuse who is being seen 12/07/2021 for the evaluation of chest pain, elevated troponin, new LBBB at the request of Dr. Nevada Crane.  History of Present Illness:   Austin Weber had remote MI in 1999, details unclear, then was admitted by Dr. Terrence Dupont back in 2012 with inferior MI with PCI as above. EF reported to be 30-35% by LV gram at that time. We do not have a subsequent echo on file. He reports he has not been following with any cardiologist since then. He has recently been following with Dr. Theador Hawthorne as for progressive kidney failure. In 10/2021, he was advised to go to ER due to severe HTN but had declined. Losartan was discontinued at that time due to worsening renal function and hyperkalemia, but at that visit patient then informed Dr. Theador Hawthorne he had actually stopped all of his blood pressure medicines. Note states, "I then discussed the patient will stop his amlodipine siphoning with the pain. Stop his terazosin and start him on hydralazine." In f/u 12/01/21, BP was still 168/82 so he was started on chlorthalidone. He has been trying to quit smoking after 40-50 years of use, down to 5-6 cigarettes a day. Denies ETOH or drug use.  He reports he's been in [place] lately and taking meds as prescribed. The medicine list in Epic does not appear to have been reconciled yet so does not reflect recent nephrology  changes above. Of note he does take tadalafil every single day and believes he last did so at 6pm on 11/29 so would avoid NTG going forward for 48 hours. Yesterday around 4pm he developed sudden onset of chest pain across his whole chest to his back associated with SOB. He chewed 4 baby aspirin around 4:30pm. Given persistence of symptoms he called EMS. Per triage notes, EMS noted prominent wheezing and hypoxia at 78% RA, given albuterol and 2 SL NTG, required CPAP then BiPAP with improvement in respiratory distress and resolution of chest pain. He was reported to be tachycardic and hypertensive. Initial HR here 120s-130s, BP 148/94. EKG showed presumed sinus tachycardia, difficult to ascertain P waves, LBBB, nonspecific STT changes. Labs demonstrate BUN 62, Cr 4.13 -> 3.72 (up from 3.11 in 10/2021, previously around the 2.1-2.2 range), BNP 509, hsTroponin 36->373->1730->3199, pro-calcitonin <0.10, Hgb 12.7 -> 10.7, Plt 130, albumin 2.8, K 4.5, Mg 2.0, initial glucose 353, Covid/flu neg. He received abx, initially started on fluids but d/c'd soon after, and IV heparin per pharmacy. CXR c/w R basilar PNA, cardiac shadow upper normal limits. He denies any recent fevers, chills, weight loss, cough, or any recent chest pain otherwise. Dr. Nevada Crane contacted our team earlier this AM to review with Dr. Audie Box who did not feel this represented STEMI, but cardiology asked to see patient in consultation so seen urgently. He is now de-escalated to [place] and asking if he can go home. He denies any current chest pain.  Repeat EKG confirms sinus tach with LBBB though on telemetry he appears to be alternating between narrow complex rhythm and alternating axes, possible intermittent RBBB versus PVCs.   Past Medical History:  Diagnosis Date   CAD (coronary artery disease)    Chronic kidney disease    Diabetes mellitus ([place])    Hyperkalemia    Hyperlipidemia    Hypertension    Ischemic cardiomyopathy    EF 30-35% in 2012    Proteinuria     Past Surgical History:  Procedure Laterality Date   SHOULDER SURGERY Left      Home Medications:  Prior to Admission medications   Medication Sig Start Date End Date Taking? Authorizing Provider  amLODipine (NORVASC) 10 MG tablet Take 1 tablet (10 mg total) by mouth daily. 02/09/21   Dettinger, Fransisca Kaufmann, MD  aspirin EC 81 MG tablet Take 81 mg by mouth 2 (two) times daily.    [provider]  atorvastatin (LIPITOR) 20 MG tablet Take 1 tablet (20 mg total) by mouth daily. 02/09/21   Dettinger, Fransisca Kaufmann, MD  Blood Glucose Monitoring Suppl [facility] VERIO FLEX SYSTEM) w/Device KIT USE AS DIRECTED 09/13/21   Dettinger, Fransisca Kaufmann, MD  carvedilol (COREG) 25 MG tablet Take 25 mg by mouth 2 (two) times daily. 03/09/21   [provider]  fluticasone (FLONASE) 50 MCG/ACT nasal spray Place 1 spray into both nostrils 2 (two) times daily as needed for allergies or rhinitis. 06/11/21   Dettinger, Fransisca Kaufmann, MD  glipiZIDE (GLUCOTROL XL) 10 MG 24 hr tablet Take 1 tablet (10 mg total) by mouth 2 (two) times daily. 02/09/21   Dettinger, Fransisca Kaufmann, MD  losartan (COZAAR) 100 MG tablet Take 1 tablet (100 mg total) by mouth daily. 02/09/21   Dettinger, Fransisca Kaufmann, MD  OneTouch Delica Lancets 26Z MISC TWICE DAILY 09/13/21   Dettinger, Fransisca Kaufmann, MD  [facility] VERIO test strip TWICE DAILY 09/13/21   Dettinger, Fransisca Kaufmann, MD  tadalafil (CIALIS) 5 MG tablet TAKE ONE (1) TABLET EACH DAY 09/13/21   Dettinger, Fransisca Kaufmann, MD  terazosin (HYTRIN) 2 MG capsule Take 1 capsule (2 mg total) by mouth at bedtime. 02/09/21   Dettinger, Fransisca Kaufmann, MD    Inpatient Medications: Scheduled Meds:  aspirin  81 mg Oral Daily   atorvastatin  80 mg Oral Daily   insulin aspart  0-9 Units Subcutaneous Q4H   ipratropium-albuterol  3 mL Nebulization Q6H   sodium chloride flush  3 mL Intravenous Q12H   Continuous Infusions:  sodium chloride     sodium chloride     cefTRIAXone (ROCEPHIN)  IV     doxycycline (VIBRAMYCIN) IV      heparin 1,200 Units/hr (12/07/21 0706)   PRN Meds: sodium chloride, albuterol, sodium chloride flush  Allergies:    Allergies  Allergen Reactions   Nitroglycerin     NOT an allergy - just FYI patient takes tadalafil regularly so please review last dose    Social History:   Social History   Socioeconomic History   Marital status: Legally Separated    Spouse name: Austin Weber   Number of children: 1   Years of education: 12   Highest education level: 12th grade  Occupational History   Occupation: Retired  Tobacco Use   Smoking status: Every Day    Packs/day: 0.50    Years: 40.00    Total pack years: 20.00    Types: Cigarettes    Passive exposure: Current   Smokeless tobacco: Never  Tobacco comments:    down from 2 ppd  Vaping Use   Vaping Use: Never used  Substance and Sexual Activity   Alcohol use: Never   Drug use: Never   Sexual activity: Not Currently    Partners: Female  Other Topics Concern   Not on file  Social History Narrative   Lives alone. Lots of family nearby   Social Determinants of Health   Financial Resource Strain: Low Risk  (08/08/2021)   Overall Financial Resource Strain (CARDIA)    Difficulty of Paying Living Expenses: Not hard at all  Food Insecurity: No Food Insecurity (08/08/2021)   Hunger Vital Sign    Worried About Running Out of Food in the Last Year: Never true    Ran Out of Food in the Last Year: Never true  Transportation Needs: No Transportation Needs (08/08/2021)   PRAPARE - Hydrologist (Medical): No    Lack of Transportation (Non-Medical): No  Physical Activity: Sufficiently Active (08/08/2021)   Exercise Vital Sign    Days of Exercise per Week: 5 days    Minutes of Exercise per Session: 60 min  Stress: No Stress Concern Present (08/08/2021)   [place]    Feeling of Stress : Only a little  Social Connections: Moderately Isolated  (08/08/2021)   Social Connection and Isolation Panel [NHANES]    Frequency of Communication with Friends and Family: More than three times a week    Frequency of Social Gatherings with Friends and Family: More than three times a week    Attends Religious Services: Never    Marine scientist or Organizations: No    Attends Archivist Meetings: Never    Marital Status: Married  Human resources officer Violence: Not At Risk (08/08/2021)   Humiliation, Afraid, Rape, and Kick questionnaire    Fear of Current or Ex-Partner: No    Emotionally Abused: No    Physically Abused: No    Sexually Abused: No    Family History:    Family History  Problem Relation Age of Onset   Heart disease Father      ROS:  Please see the history of present illness.  All other ROS reviewed and negative.     Physical Exam/Data:   Vitals:   12/07/21 0628 12/07/21 0710 12/07/21 0725 12/07/21 0749  BP:   (!) 167/79   Pulse: (!) 106  (!) 104   Resp:   18   Temp:      TempSrc:      SpO2: 96% 96% 94% 95%  Weight:      Height:        Intake/Output Summary (Last 24 hours) at 12/07/2021 0839 Last data filed at 12/07/2021 6967 Gross per 24 hour  Intake 629.68 ml  Output 1100 ml  Net -470.32 ml      12/06/2021    8:02 PM 10/17/2021    8:07 AM 06/11/2021    7:57 AM  Last 3 Weights  Weight (lbs) 204 lb 204 lb 201 lb  Weight (kg) 92.534 kg 92.534 kg 91.173 kg     Body mass index is 26.91 kg/m.  General: Well developed, well nourished, in no acute distress. Head: Normocephalic, atraumatic, sclera non-icteric, no xanthomas, nares are without discharge. Neck: Negative for carotid bruits. JVP not elevated. Lungs: Coarse BS throughout with rare expiratory wheezing, occasional rhonchi cleared with coughing. No acute rales. Breathing is unlabored. Heart: Tachycardic, regular,  S1 S2 without murmurs, rubs, or gallops.  Abdomen: Soft, non-tender, non-distended with normoactive bowel sounds. No  rebound/guarding. Extremities: No clubbing or cyanosis. Trace BLE edema. Distal pedal pulses are 2+ and equal bilaterally. Neuro: Alert and oriented X 3. Moves all extremities spontaneously. Psych:  Responds to questions appropriately with a normal affect.   EKG:  The EKG was personally reviewed and demonstrates:  Presumed sinus tachycardia, difficult to ascertain P waves, LBBB, nonspecific STT changes Telemetry:  Telemetry was personally reviewed and demonstrates:  outlined above  Relevant CV Studies: Staged PCI 05/14/10 PROCEDURE: 1. Left cardiac cath with selective left and right coronary     angiography via right groin using Judkins technique. 2. Successful PTCA to proximal and mid junction of LAD using 2.5 x 8-     mm long mini Trek balloon. 3. Successful deployment of 3.0 x 16-mm long thrombosed element Plus     drug-eluting stent in proximal and mid junction of LAD. 4. Successful postdilatation of this stent using 3.5 x 12-mm long [place]     Trek balloon going up to 20 atmospheric pressure.   INDICATIONS FOR PROCEDURE:  Austin Weber is a 71 year old white male with past medical history significant for coronary artery disease, history of MI approximately 13 years ago, hypertension, non-insulin-dependent diabetes mellitus, hypercholesteremia, morbid obesity, tobacco abuse, positive family history of coronary artery disease who was admitted on May 09, 2010, as code STEMI was called and the patient was noted to have inferior wall MI requiring emergency PCI to mid RCA.  The patient was noted to have critical stenosis in proximal LAD.  The patient is brought to the cath lab for staged PCI to LAD.   After obtaining the informed consent, the patient was brought to the cath lab and was placed on fluoroscopy table.  Right groin was prepped and draped in usual fashion.  Xylocaine 1% was used for local anesthesia in the right groin.  With the help of thin-wall needle, 6-French arterial sheath  was placed.  The sheath was aspirated and flushed. Next, a 6-French right Judkins catheter was advanced over the wire under fluoroscopic guidance up to the ascending aorta.  Wire was pulled out, the catheter was aspirated and connected to the manifold.  Catheter was further advanced and engaged into right coronary ostium.  The single view of right coronary artery was obtained.  Next, the catheter was disengaged and was pulled out over the wire and was replaced with 6- [place] XB LAD guiding catheter, which was advanced over the wire under fluoroscopic guidance up to the ascending aorta.  Wire was pulled out, the catheter was aspirated and connected to the manifold.  Catheter was further advanced and engaged into left coronary ostium.  Multiple views of the left system were taken.   FINDINGS:  RCA was patent at prior PTCA and stented site.  LAD had 75- 80% proximal and mid junction stenosis as before.   INTERVENTIONAL PROCEDURE:  Successful PTCA to proximal and mid junction of LAD was done using 2.5 x 8-mm long mini Trek balloon for predilatation and then 3.0 x 16-mm long thrombosed element Plus drug- eluting stent was deployed in proximal and mid junction of LAD at 11 atmospheric pressure.  Stent was postdilated using 3.5 x 12-mm long [place] Trek balloon going up to 20 atmospheric pressure.  Lesion dilated from 75-80% to 0% residual with excellent TIMI grade 3 distal flow without evidence of dissection or distal embolization.  The patient received weight-based Angiomax and  30 mg of prasugrel during the procedure.  Arteriotomy was brought by [place] without difficulty with good hemostasis.  The patient tolerated procedure well.  There were no complications.  The patient was transferred to recovery room in stable condition.   Allegra Lai. Terrence Dupont, M.D.  Cath 05/09/10 PROCEDURES: 1. Left cardiac cath with selective left and right coronary     angiography via right groin using Judkins  technique. 2. Successful PTCA to mid RCA using 2.5 x 12 mm long mini Trek     balloon. 3. Successful deployment of 4.0 x 24 mm long Promus Element drug-     eluting stent in mid RCA. 4. Successful postdilatation of this stent using 4.0 x 15 mm long [place]     Trek balloon.   INDICATIONS FOR PROCEDURE:  Austin Weber is a 71 year old white male with past medical history significant for coronary artery disease, history of MI approximately 13 years ago, hypertension, non-insulin- dependent diabetes mellitus, hypercholesteremia, morbid obesity, tobacco abuse, positive family history of coronary artery disease.  Father died at the age of 50 due to MI.  He came to the [place] from [facility] by EMS, complaining of recurrent retrosternal chest pain, radiating to the left arm, associated with diaphoresis and mild shortness of breath since 1 a.m.  He states chest pain resolved in 15-20 minutes, did not seek medical attention, and again this afternoon had similar chest pain associated with nausea, diaphoresis, chest pain radiating to the left shoulder and left arm.  He took 4 baby aspirin with relief of chest pain.  He states stopped all his medications except metformin and aspirin.   PAST MEDICAL HISTORY:  As above.   PAST SURGICAL HISTORY:  He had left shoulder surgery in the past.   ALLERGIES:  No known drug allergies.   MEDICATIONS AT HOME:  He takes aspirin and metformin.   SOCIAL HISTORY:  He is married, smokes 1-1/2 pack per day.  No history of alcohol abuse.  Works as Games developer.   FAMILY HISTORY:  Positive for coronary artery disease,   PHYSICAL EXAMINATION:  GENERAL:  He was alert, awake, and oriented x3, in no acute distress. VITAL SIGNS:  Blood pressure was 154/90, pulse was 96 and regular. HEENT:  Conjunctivae pink. NECK:  Supple.  No JVD.  No bruit. LUNGS:  Clear to auscultation. CARDIOVASCULAR:  S1 and S2 was normal.  There was soft systolic  murmur and S4 gallop. ABDOMEN:  Soft.  Bowel sounds were present, nontender. EXTREMITIES:  There is no clubbing, cyanosis, or edema.   EKG done on the field showed sinus tachycardia with minimal ST elevation in inferior leads with no significant reciprocal changes, also had poor R-wave progression in V1-V3.  Discussed with the patient regarding emergency left cath, possible PTCA stenting, its risks and benefits i.e. death, MI, stroke, need for emergency CABG, local vascular complications, etc., and consented for PCI.   DESCRIPTION OF PROCEDURE:  After obtaining the informed consent, the patient was brought to the cath lab and was placed on fluoroscopy table. The right groin was prepped and draped in usual fashion.  A 1% Xylocaine was used for local anesthesia in the right groin.  With the help of thin- wall needle, a 6-French arterial sheath was placed.  The sheath was aspirated and flushed.  Next, a 6-French right Judkins catheter was advanced over the wire under fluoroscopic guidance up to the ascending aorta.  Wire was pulled out.  The catheter was  aspirated and connected to the manifold.  Catheter was further advanced and engaged into right coronary ostium.  The single view of right coronary artery was obtained. Next, the catheter was disengaged and was pulled out over the wire and was replaced with 6-French left Judkins catheter which was advanced over the wire under fluoroscopic guidance up to the ascending aorta.  Wire was pulled out.  The catheter was aspirated and connected to the manifold.  Catheter was further advanced and engaged into left coronary ostium.  Multiple views of the left system were taken.  Next, the catheter was disengaged and was pulled out over the wire and was replaced with 6-French pigtail catheter which was advanced over the wire under fluoroscopic guidance up to the ascending aorta.  At the end of the procedure, catheter was further advanced across the  aortic valve into the LV.  LV pressures were recorded.  Next, LV graft was done in 30- degree RAO position.  Post angiographic pressures were recorded from LV and then pullback pressures were recorded from aorta.  There was no gradient across aortic valve.  Next, the pigtail catheter was pulled out over the wire.  Sheaths were aspirated and flushed.   FINDINGS:  LV showed anterolateral and inferior wall hypokinesia, LVH, EF of 30-35%.  Left main was patent.  The LAD has 20-25% proximal stenosis and 70-75% proximal and mid junction focal stenosis.  Diagonal 1 is small which has 30-40% stenosis.  Diagonal 2 and 3 were very small. Left circumflex has 30-40% proximal stenosis.  OM-1 is very, very small. OM-2 is moderate size which has 20% proximal and 30% mid stenosis.  RCA has 20-25% proximal stenosis and 95-99% mid stenosis with ruptured plaque with haziness with TIMI grade 2 plus distal flow.   INTERVENTIONAL PROCEDURE:  Successful PTCA to mid RCA was done using 2.5 x 12 mm long mini Trek balloon for predilatation and then 4.0 x 24 mm long Promus Element Plus drug-eluting stent was deployed at 11 atmospheric pressure.  Stent was postdilated using 4.0 x 15 mm long [place] Trek balloon, going up to 15 atmospheric pressure.  Lesion dilated from 95 to 99% with ruptured plaque and thrombosed to 0% residual with excellent TIMI grade 3 distal flow without evidence of dissection or distal embolization.  The patient received weight-based Angiomax and 60 mg of Effient and also 5 mg of IV Lopressor q.5 minutes x2 during the procedure.  The patient tolerated procedure well.  There were no complications.  The patient was transferred to CCU in stable condition. Total balloon time was 24 minutes.    Allegra Lai. Terrence Dupont, M.D.      Laboratory Data:  High Sensitivity Troponin:   Recent Labs  Lab 12/06/21 1950 12/06/21 2145 12/07/21 0040 12/07/21 0201  TROPONINIHS 36* 373* 1,730* 3,199*      Chemistry Recent Labs  Lab 12/06/21 1950 12/06/21 2100 12/06/21 2103 12/07/21 0040  NA 138 138 136 140  K 4.6 4.5 4.7 4.5  CL 110  --   --  108  CO2 18*  --   --  17*  GLUCOSE 353*  --   --  289*  BUN 62*  --   --  63*  CREATININE 4.13*  --   --  3.72*  CALCIUM 8.7*  --   --  9.0  MG  --   --   --  2.0  GFRNONAA 14*  --   --  16*  ANIONGAP 10  --   --  15    Recent Labs  Lab 12/07/21 0040  PROT 5.5*  ALBUMIN 2.8*  AST 23  ALT 20  ALKPHOS 57  BILITOT 0.2*   Lipids No results for input(s): "CHOL", "TRIG", "HDL", "LABVLDL", "LDLCALC", "CHOLHDL" in the last 168 hours.  Hematology Recent Labs  Lab 12/06/21 1950 12/06/21 2100 12/06/21 2103 12/07/21 0040  WBC 14.5*  --   --  9.4  RBC 4.07*  --   --  3.45*  HGB 12.7* 12.6* 10.9* 10.7*  HCT 39.7 37.0* 32.0* 33.5*  MCV 97.5  --   --  97.1  MCH 31.2  --   --  31.0  MCHC 32.0  --   --  31.9  RDW 12.2  --   --  12.4  PLT 198  --   --  130*   Thyroid No results for input(s): "TSH", "FREET4" in the last 168 hours.  BNP Recent Labs  Lab 12/06/21 1950  BNP 509.9*    DDimer No results for input(s): "DDIMER" in the last 168 hours.   Radiology/Studies:  DG Chest Portable 1 View  Result Date: 12/06/2021 CLINICAL DATA:  Chest pain and shortness of breath EXAM: PORTABLE CHEST 1 VIEW COMPARISON:  None FINDINGS: Cardiac shadow is at the upper limits of normal in size. Diffuse right basilar infiltrate is noted consistent with pneumonia. No sizable effusion is seen. No bony abnormality is noted. IMPRESSION: Right basilar pneumonia Electronically Signed   By: Inez Catalina M.D.   On: 12/06/2021 20:29     Assessment and Plan:   1. Acute hypoxic respiratory failure 2. Suspect acute myocardial infarction with remote hx of CAD s/p MI 1999, MI 2012 s/p PCI to RCA/LAD 3. Possible chronic HFrEF - EF 30-35% in 2012 without reassessment available 4. New LBBB with alternating axis on telemetry, occasional narrow complex rhythm as  well 5. Essential HTN, historically poorly controlled 6. Possible RLL CAP 7. AKI on recent progressive CKD stage IV 8. Mild anemia without reported bleeding 9. Hyperlipidemia  10. NIDDM  Patient seen expeditiously given abnormal EKG with new LBBB and rising troponin. He remains chest pain free but clinical scenario is concerning to me for acute MI with possible early decompensated CHF (superimposed on possible COPD with longstanding tobacco abuse, still smoking 5-6 cigarettes per day). Symptoms resolved with BiPAP and SL NTG. Of note, he reports he last took tadalafil on 11/29 at 6pm so prudent to avoid NTG going forward (perhaps a full 48 hours in case he took with his AM meds yesterday as he also takes a batch at [facility]). Echo dept was contacted for stat echo. Since he took 326m ASA yesterday at 4:30pm, cancelled order for rectal ASA but will start 832mdaily. Start atorvastin 8017maily. Continue heparin per pharmacy. Ordered pharmacy team to please review home med rec. Would avoid ACEi, ARB, ARNI, spironolactone due to worsening renal function and h/o hypoerkalemia. Will recheck troponin now along with f/u CBC given declining Hgb/plt count of unclear etiology. Very difficult situation with both concern for heart and kidneys. Dr. BerGwenlyn Foundme to assess at bedside and had long conversation with patient about concern for coronary ischemia. Echo at bedside showed moderate-severe LV dysfunction per his preliminary review. They discussed that cardiac cath would not be without risk to the kidney function and that there is a chance of progressive kidney failure and dialysis. The patient states he has not wanted to go on dialysis in the future but also understands that he could  potentially die due to heart reasons given the clinical scenario. Per their shared decision making, the patient has agreed to cardiac cath with limited dye exposure and no LV gram. Per d/w Dr. Gwenlyn Found, will hold off beta blocker given the  telemetry findings with alternating axis as well as potential acute decompensation - we will give limited fluids in prep for cath at 50cc/hr. We have asked IM to consult nephrology to follow along. Dissection considered given the quality of initial symptoms but felt less likely given normal mediastinal contours and complete resolution of symptoms. Further recs forthcoming pending cath. Will defer abx management to IM.  Risk Assessment/Risk Scores:     TIMI Risk Score for Unstable Angina or Non-ST Elevation MI:   The patient's TIMI risk score is 5, which indicates a 26% risk of all cause mortality, new or recurrent myocardial infarction or need for urgent revascularization in the next 14 days.  New York Heart Association (NYHA) Functional Class NYHA Class IV on arrival        For questions or updates, please contact [place] Please consult [email or web address] for contact info under    Signed, Charlie Pitter, PA-C  12/07/2021 8:39 AM  Agree with findings by Burna Mortimer  We are asked to see Mr. Boniface for acute coronary syndrome/non-STEMI.  He is a 71 year old African-American male with a prior history of CAD status post RCA and LAD stenting remotely.  He has known LV dysfunction, ongoing tobacco abuse, treated diabetes, hypertension and hyperlipidemia.  He has not been seen in follow-up for many years by a cardiologist.  He developed chest pain yesterday afternoon with progressive dyspnea and EMS was called.  He was given several nitroglycerin which ultimately resulted in resolution of his chest pain.  In the ER he is currently pain-free.  His EKG shows new left bundle branch block.  His troponins are progressively increasing from 36 when he arrived to 3200.  He has intermittent left bundle branch block.  His serum creatinine is approximately 4.  He is followed by nephrologist as an outpatient.  His exam is benign.  He has no peripheral edema.  Lungs are clear.  He has no history  jugulovenous pressure.  I am concerned that in the last 12 years since his intervention that he developed diffuse CAD.  Given the rising troponin level and intermittent left bundle branch block I feel he needs cor onary angiography limit the contrast.  We discussed the possibility of acute kidney injury potentially requiring dialysis although patient adamantly says that he does not want to go on dialysis.  This poses a dilemma although I think it is risk of having a more serious coronary event given the scenario is fairly high.  We decided to proceed with diagnostic coronary angiography using shared decision making.  Lorretta Harp, M.D., [place], [facility], [facility] [address] [place], [place]  [address]  [phone] 12/07/2021 9:09 AM

## 2021-12-07 NOTE — Hospital Course (Addendum)
Austin Weber was admitted to the hospital with the working diagnosis of community acquired pneumonia, complicated with respiratory failure.   71 yo male with the past medical history of CKD stage 3b, T2DM, heart failure and hypertension who presented with dyspnea. Patient at home developed severe dyspnea, EMS was called and he was found in respiratory distress with 02 saturation in the 70's. Patient was transported the ED and was placed on non invasive mechanical ventilation. His blood pressure in the ED 148/77. HR 104, RR 22 and 02 saturation 98% on bipap. Lungs with rales but not wheezing. Heart with S1 and S2 present and tachycardic with no gallops, abdomen with no distention, no lower extremity edema.   ABG 7,24/ 45/ 207/ 21/ 100% Na 138, K 4,6 Cl 110, bicarbonate 18, glucose 353 BUN 62, CR 4.13 BNP 509  High sensitive troponin 36, 373, 1,730, 3,199, 4,829  Wbc 14,5 hgb 12.0 plt 198  Sars covid 19 negative   Chest radiograph with cardiomegaly with dense right lower lobe patchy infiltrate with positive air bronchogram.   EKG 131 bpm, normal axis, left bundle branch block, SVT with no significant ST segment or T wave changes   Successfully liberated from Bipap and transitioned to nasal cannula.   12/01 Patient placed on broad spectrum antibiotic therapy and heparin drip. Cardiac catheterization with diffuse coronary artery disease, possible PCI to ostial RCA during this hospitalization at a later time when renal function more stable.  12/03 no chest pain and responding well to antibiotic therapy, plan for coronary angiography and PCI tomorrow.  12/04 PCI successful to proximal RCA. Transition to oral antibiotic therapy.  12/05 hemodynamically stable, follow up renal function in am, possible dc in 24 hrs.

## 2021-12-07 NOTE — Assessment & Plan Note (Signed)
Patient with right lower lobe infiltrate. Acute hypoxemic and hypercapnic respiratory failure.  Severe sepsis present on admission.   Patient required Bipap on admission, now he has been transitioned to supplemental 02 per [place]. 02 saturation is 96 % on room air.   Antibiotic therapy with IV ceftriaxone and oral azithromycin.  As needed bronchodilator therapy Airway clearing techniques with flutter valve and incentive spirometer.

## 2021-12-07 NOTE — Assessment & Plan Note (Signed)
Smoking cessation counseling 

## 2021-12-07 NOTE — Progress Notes (Addendum)
High-sensitivity troponin continues to up-trend, up to 3199.  Pharmacy consulted to start heparin drip for suspected NSTEMI.  Cardiology consulted.  Addendum:  Returned to see the patient.  He is on BIPAP and in no acute distress.  Denies having chest pain.  Unclear if LBBB on 12 lead EKG is new.  However, it is not seen on 12 lead EKG done in 2012.    Full dose aspirin ordered.  Discussed with cardiology Dr. Audie Box who will see in consultation.    Complete 2D echo ordered and pending.  AKI is improved but renal function is not close to baseline with GFR 16.

## 2021-12-07 NOTE — ED Notes (Signed)
Echo at bedside

## 2021-12-07 NOTE — Assessment & Plan Note (Addendum)
Glucose has been stable, and insulin therapy has been discontinued.  His fasting glucose this am is 171 mg/dl.  Continue with statin therapy.

## 2021-12-08 DIAGNOSIS — I5021 Acute systolic (congestive) heart failure: Secondary | ICD-10-CM

## 2021-12-08 LAB — GLUCOSE, CAPILLARY
Glucose-Capillary: 121 mg/dL — ABNORMAL HIGH (ref 70–99)
Glucose-Capillary: 115 mg/dL — ABNORMAL HIGH (ref 70–99)
Glucose-Capillary: 180 mg/dL — ABNORMAL HIGH (ref 70–99)
Glucose-Capillary: 151 mg/dL — ABNORMAL HIGH (ref 70–99)
Glucose-Capillary: 137 mg/dL — ABNORMAL HIGH (ref 70–99)

## 2021-12-08 LAB — CBC
HCT: 30.1 % — ABNORMAL LOW (ref 39.0–52.0)
Hemoglobin: 10 g/dL — ABNORMAL LOW (ref 13.0–17.0)
MCH: 31.5 pg (ref 26.0–34.0)
MCHC: 33.2 g/dL (ref 30.0–36.0)
MCV: 95 fL (ref 80.0–100.0)
Platelets: 139 10*3/uL — ABNORMAL LOW (ref 150–400)
RBC: 3.17 MIL/uL — ABNORMAL LOW (ref 4.22–5.81)
RDW: 12.4 % (ref 11.5–15.5)
WBC: 8.3 10*3/uL (ref 4.0–10.5)
nRBC: 0 % (ref 0.0–0.2)

## 2021-12-08 LAB — TROPONIN I (HIGH SENSITIVITY): Troponin I (High Sensitivity): 4110 ng/L (ref <18)

## 2021-12-08 LAB — BASIC METABOLIC PANEL
Anion gap: 9 (ref 5–15)
BUN: 48 mg/dL — ABNORMAL HIGH (ref 8–23)
CO2: 18 mmol/L — ABNORMAL LOW (ref 22–32)
Calcium: 9.2 mg/dL (ref 8.9–10.3)
Chloride: 113 mmol/L — ABNORMAL HIGH (ref 98–111)
Creatinine, Ser: 3.09 mg/dL — ABNORMAL HIGH (ref 0.61–1.24)
GFR, Estimated: 21 mL/min — ABNORMAL LOW (ref >60)
Glucose, Bld: 117 mg/dL — ABNORMAL HIGH (ref 70–99)
Potassium: 4.2 mmol/L (ref 3.5–5.1)
Sodium: 140 mmol/L (ref 135–145)

## 2021-12-08 LAB — HEPARIN LEVEL (UNFRACTIONATED)
Heparin Unfractionated: 0.32 IU/mL (ref 0.30–0.70)
Heparin Unfractionated: 0.28 IU/mL — ABNORMAL LOW (ref 0.30–0.70)

## 2021-12-08 MED ORDER — METOPROLOL SUCCINATE ER 25 MG PO TB24
25.0000 mg | ORAL_TABLET | Freq: Every day | ORAL | Status: DC
  Administered 2021-12-08 – 2021-12-09 (×2): 25 mg via ORAL
  Filled 2021-12-08 (×2): qty 1

## 2021-12-08 NOTE — Progress Notes (Addendum)
Progress Note   Patient: Austin Weber OEU:[id number] DOB: 1950-11-27 DOA: 12/06/2021     2 DOS: the patient was seen and examined on 12/08/2021   Brief hospital course: Mr. Austin Weber was admitted to the hospital with the working diagnosis of community acquired pneumonia, complicated with respiratory failure.   71 yo male with the past medical history of CKD stage 3b, T2DM, heart failure and hypertension who presented with dyspnea. Patient at home developed severe dyspnea, EMS was called and he was found in respiratory distress with 02 saturation in the 70's. Patient was transported the ED and was placed on non invasive mechanical ventilation. His blood pressure in the ED 148/77. HR 104, RR 22 and 02 saturation 98% on bipap. Lungs with rales but not wheezing. Heart with S1 and S2 present and tachycardic with no gallops, abdomen with no distention, no lower extremity edema.   ABG 7,24/ 45/ 207/ 21/ 100% Na 138, K 4,6 Cl 110, bicarbonate 18, glucose 353 BUN 62, CR 4.13 BNP 509  High sensitive troponin 36, 373, 1,730, 3,199, 4,829  Wbc 14,5 hgb 12.0 plt 198  Sars covid 19 negative   Chest radiograph with cardiomegaly with dense right lower lobe patchy infiltrate with positive air bronchogram.   EKG 131 bpm, normal axis, left bundle branch block, SVT with no significant ST segment or T wave changes   12/01 Patient placed on broad spectrum antibiotic therapy and heparin drip. Cardiac catheterization with diffuse coronary artery disease, possible PCI to ostial RCA during this hospitalization at a later time when renal function more stable.   Assessment and Plan: * CAP (community acquired pneumonia) Patient with right lower lobe infiltrate. Acute hypoxemic and hypercapnic respiratory failure.  Severe sepsis present on admission.   Patient required Bipap on admission, now he has been transitioned to supplemental 02 per Austin Weber. 02 saturation is 92% on room air.   Antibiotic therapy with IV  ceftriaxone and oral azithromycin.  As needed bronchodilator therapy Airway clearing techniques with flutter valve and incentive spirometer.   NSTEMI (non-ST elevated myocardial infarction) (Austin Weber) EKG with new left bundle branch block, echocardiogram with positive well motion abnormalities.   Plan to continue medical therapy with IV heparin Continue with aspirin and atorvastatin.  Metoprolol 25 mg succinate.  No further chest pain, continue with as needed hydromorphone and oxycodone.  Possible PCI to ostial RCA during this hospitalization depending on renal function.   Acute systolic heart failure (Austin Weber) Echocardiogram with reduced LV systolic function 35 to 15%, moderate LVH, RV systolic function preserved. Small pericardial effusion with mild RV diastolic collapse. No significant valvular disease. Akinesis of the inferolateral wall with moderate LV dysfunction.  No signs of volume overload Continue medical therapy for acute coronary syndrome Systolic blood pressure 400 to 134 mmHg. Limited pharmacologic options due to reduced GFR. Continue with metoprolol.    Acute kidney injury superimposed on chronic kidney disease (Austin Weber) CKD stage 3 b (base cr at 53 old records personally reviewed).   No clinical sings of volume overload. Renal function with serum cr at 3,0. K at 4,2 and serum bicarbonate at 18. Cl 113.  Anion gap 9.   Plan to hold on IV fluids for now and follow up renal function in am, avoid hypotension and nephrotoxic medications.   Hypertension associated with diabetes (Austin Weber) Continue blood pressure monitoring   Type 2 diabetes mellitus with hyperlipidemia (Austin Weber) Continue insulin sliding scale for glucose cover and monitoring. Fasting glucose this am 117 mg/dl.  Continue with statin  therapy.    Cigarette nicotine dependence without complication Smoking cessation counseling.         Subjective: Patient had chest pain post procedure yesterday, today with no chest  pain and continue to improve dyspnea, no nausea or vomiting   Physical Exam: Vitals:   12/08/21 0614 12/08/21 0800 12/08/21 1044 12/08/21 1125  BP: (!) 152/77 (!) 173/76 (!) 160/72 134/78  Pulse: (!) 105 (!) 101 100 (!) 107  Resp: 20 20  20   Temp: 98.5 F (36.9 C) 98.3 F (36.8 C)  98.1 F (36.7 C)  TempSrc: Oral Oral  Oral  SpO2: 95% 95%  92%  Weight: 88.3 kg     Height:       Neurology awake and alert ENT with mild pallor Cardiovascular with S1 and S2 present and regular with no gallops, rubs or murmurs No JVD No lower extremity edema Respiratory with rales at bases more right than left, no wheezing Abdomen with no distention  Data Reviewed:    Family Communication: no family at the bedside   Disposition: Status is: Inpatient Remains inpatient appropriate because: acute coronary syndrome and pneumonia on iV therapies   Planned Discharge Destination: Home      Author: Tawni Millers, MD 12/08/2021 2:37 PM  For on call review [email or web address].

## 2021-12-08 NOTE — Progress Notes (Signed)
Rounding Note    Patient Name: Austin Weber Date of Encounter: 12/08/2021  [place] Cardiologist: None Dr. Gwenlyn Weber  Subjective   Did experience an post catheterization chest discomfort and received IV Dilaudid.  EKG was no change but he did have sinus tachycardia rate of 117.  Left bundle branch block persistent.  Inpatient Medications    Scheduled Meds:  aspirin  81 mg Oral Daily   atorvastatin  80 mg Oral Daily   azithromycin  500 mg Oral Daily   insulin aspart  0-9 Units Subcutaneous Q4H   sodium chloride flush  3 mL Intravenous Q12H   sodium chloride flush  3 mL Intravenous Q12H   Continuous Infusions:  sodium chloride     cefTRIAXone (ROCEPHIN)  IV Stopped (12/07/21 1053)   heparin 1,200 Units/hr (12/08/21 0339)   PRN Meds: sodium chloride, HYDROmorphone (DILAUDID) injection, ipratropium-albuterol, oxyCODONE, sodium chloride flush   Vital Signs    Vitals:   12/07/21 2213 12/07/21 2243 12/08/21 0614 12/08/21 0800  BP: (!) 149/72 (!) 157/65 (!) 152/77 (!) 173/76  Pulse: 94 96 (!) 105 (!) 101  Resp:   20 20  Temp:   98.5 F (36.9 C) 98.3 F (36.8 C)  TempSrc:   Oral Oral  SpO2: 92% 96% 95%   Weight:   88.3 kg   Height:        Intake/Output Summary (Last 24 hours) at 12/08/2021 0900 Last data filed at 12/08/2021 0617 Gross per 24 hour  Intake 701.44 ml  Output 1370 ml  Net -668.56 ml      12/08/2021    6:14 AM 12/06/2021    8:02 PM 10/17/2021    8:07 AM  Last 3 Weights  Weight (lbs) 194 lb 9.6 oz 204 lb 204 lb  Weight (kg) 88.27 kg 92.534 kg 92.534 kg      Telemetry    No adverse arrhythmias, sinus tachycardia 110- Personally Reviewed  ECG    Sinus rhythm left bundle branch block- Personally Reviewed  Physical Exam   GEN: No acute distress.   Neck: No JVD Cardiac: Tachycardic regular, no murmurs, rubs, or gallops.  Radial artery cath site clean dry and intact Respiratory: Clear to auscultation bilaterally. GI: Soft,  nontender, non-distended  MS: 1+ bilateral lower extremity edema; No deformity. Neuro:  Nonfocal  Psych: Normal affect   Labs    High Sensitivity Troponin:   Recent Labs  Lab 12/06/21 1950 12/06/21 2145 12/07/21 0040 12/07/21 0201 12/07/21 0851  TROPONINIHS 36* 373* 1,730* 3,199* 4,829*     Chemistry Recent Labs  Lab 12/06/21 1950 12/06/21 2100 12/06/21 2103 12/07/21 0040 12/08/21 0326  NA 138   < > 136 140 140  K 4.6   < > 4.7 4.5 4.2  CL 110  --   --  108 113*  CO2 18*  --   --  17* 18*  GLUCOSE 353*  --   --  289* 117*  BUN 62*  --   --  63* 48*  CREATININE 4.13*  --   --  3.72* 3.09*  CALCIUM 8.7*  --   --  9.0 9.2  MG  --   --   --  2.0  --   PROT  --   --   --  5.5*  --   ALBUMIN  --   --   --  2.8*  --   AST  --   --   --  23  --  ALT  --   --   --  20  --   ALKPHOS  --   --   --  57  --   BILITOT  --   --   --  0.2*  --   GFRNONAA 14*  --   --  16* 21*  ANIONGAP 10  --   --  15 9   < > = values in this interval not displayed.    Lipids  Recent Labs  Lab 12/07/21 0851  CHOL 134  TRIG 52  HDL 43  LDLCALC 81  CHOLHDL 3.1    Hematology Recent Labs  Lab 12/07/21 0040 12/07/21 0851 12/08/21 0326  WBC 9.4 12.4* 8.3  RBC 3.45* 3.75* 3.17*  HGB 10.7* 12.0* 10.0*  HCT 33.5* 35.8* 30.1*  MCV 97.1 95.5 95.0  MCH 31.0 32.0 31.5  MCHC 31.9 33.5 33.2  RDW 12.4 12.4 12.4  PLT 130* 161 139*   Thyroid  Recent Labs  Lab 12/07/21 0851  TSH 2.371    BNP Recent Labs  Lab 12/06/21 1950  BNP 509.9*    DDimer No results for input(s): "DDIMER" in the last 168 hours.   Radiology    CARDIAC CATHETERIZATION  Result Date: 12/07/2021   Mid RCA lesion is 20% stenosed.   Prox RCA lesion is 30% stenosed.   Ost RCA to Prox RCA lesion is 90% stenosed.   3rd RPL lesion is 60% stenosed.   Prox Cx lesion is 40% stenosed.   1st Mrg lesion is 99% stenosed.   Prox Cx to Mid Cx lesion is 70% stenosed.   Mid LAD lesion is 10% stenosed. The LAD is a large caliber  vessel that courses to the apex. The mid LAD stented segment is patent with minimal restenosis. 2.   The Circumflex is a moderate caliber vessel with two small obtuse marginal branches. The proximal Circumflex has mild to moderate stenosis. The mid Circumflex has moderate stenosis just beyond the obtuse marginal branch. The first obtuse marginal branch has sub-total occlusion but fills from left to left collaterals. 3.  The RCA is a large dominant artery with severe ostial stenosis. The mid vessel stented segment is patent. Moderate disease in the left posterolateral artery. 4.  LVEDP 21 mmHg 5.  45 cc contrast dye used Recommendations: We could consider staged PCI of the ostial RCA. There is at least mild calcification of this lesion but PCI is an option. I would treat the disease in his Circumflex medically. Given his advanced kidney disease and his refusal to consider dialysis if it were necessary, we may opt to treat his CAD medically for now. Resume IV heparin 2 hours post radial sheath removal.   ECHOCARDIOGRAM COMPLETE  Result Date: 12/07/2021    ECHOCARDIOGRAM REPORT   Patient Name:   Austin Weber Date of Exam: 12/07/2021 Medical Rec #:  [id number]      Height:       73.0 in Accession #:    [id number]     Weight:       204.0 lb Date of Birth:  Sep 11, 1950      BSA:          2.170 m Patient Age:    71 years       BP:           167/79 mmHg Patient Gender: M              HR:  99 bpm. Exam Location:  Inpatient Procedure: 2D Echo, Cardiac Doppler and Color Doppler Indications:    NSTEMI  History:        Patient has no prior history of Echocardiogram examinations.                 Risk Factors:Diabetes, Hypertension, Dyslipidemia and Current                 Smoker. CKD.  Sonographer:    Austin Weber RDCS (AE) Referring Phys: [place]  1. Akinesis of the inferolateral wall with moderate LV dysfunction; small pericardial effusion with mild RV diastolic collapse; however IVC not dilated  and collapses with inspiration.  2. Left ventricular ejection fraction, by estimation, is 35 to 40%. The left ventricle has moderately decreased function. The left ventricle demonstrates regional wall motion abnormalities (see scoring diagram/findings for description). There is moderate concentric left ventricular hypertrophy. Left ventricular diastolic parameters are consistent with Grade III diastolic dysfunction (restrictive).  3. Right ventricular systolic function is normal. The right ventricular size is normal.  4. A small pericardial effusion is present.  5. The mitral valve is normal in structure. No evidence of mitral valve regurgitation. No evidence of mitral stenosis. Moderate mitral annular calcification.  6. The aortic valve was not well visualized. Aortic valve regurgitation is not visualized. No aortic stenosis is present.  7. The inferior vena cava is normal in size with greater than 50% respiratory variability, suggesting right atrial pressure of 3 mmHg. Comparison(s): No prior Echocardiogram. FINDINGS  Left Ventricle: Left ventricular ejection fraction, by estimation, is 35 to 40%. The left ventricle has moderately decreased function. The left ventricle demonstrates regional wall motion abnormalities. The left ventricular internal cavity size was normal in size. There is moderate concentric left ventricular hypertrophy. Left ventricular diastolic parameters are consistent with Grade III diastolic dysfunction (restrictive). Right Ventricle: The right ventricular size is normal. Right ventricular systolic function is normal. Left Atrium: Left atrial size was normal in size. Right Atrium: Right atrial size was normal in size. Pericardium: A small pericardial effusion is present. Mitral Valve: The mitral valve is normal in structure. Moderate mitral annular calcification. No evidence of mitral valve regurgitation. No evidence of mitral valve stenosis. MV peak gradient, 11.2 mmHg. The mean mitral valve  gradient is 5.0 mmHg. Tricuspid Valve: The tricuspid valve is normal in structure. Tricuspid valve regurgitation is trivial. No evidence of tricuspid stenosis. Aortic Valve: The aortic valve was not well visualized. Aortic valve regurgitation is not visualized. No aortic stenosis is present. Aortic valve mean gradient measures 4.0 mmHg. Aortic valve peak gradient measures 7.1 mmHg. Aortic valve area, by VTI measures 2.47 cm. Pulmonic Valve: The pulmonic valve was not well visualized. Pulmonic valve regurgitation is not visualized. No evidence of pulmonic stenosis. Aorta: The aortic root is normal in size and structure and the ascending aorta was not well visualized. Venous: The inferior vena cava is normal in size with greater than 50% respiratory variability, suggesting right atrial pressure of 3 mmHg. IAS/Shunts: The interatrial septum was not well visualized. Additional Comments: Akinesis of the inferolateral wall with moderate LV dysfunction; small pericardial effusion with mild RV diastolic collapse; however IVC not dilated and collapses with inspiration.  LEFT VENTRICLE PLAX 2D LVIDd:         4.80 cm LVIDs:         3.30 cm LV PW:         1.50 cm LV IVS:  1.20 cm LVOT diam:     2.10 cm LV SV:         67 LV SV Index:   31 LVOT Area:     3.46 cm  LV Volumes (MOD) LV vol d, MOD A2C: 127.0 ml LV vol d, MOD A4C: 95.7 ml LV vol s, MOD A2C: 79.0 ml LV vol s, MOD A4C: 70.1 ml LV SV MOD A2C:     48.0 ml LV SV MOD A4C:     95.7 ml LV SV MOD BP:      37.8 ml RIGHT VENTRICLE RV Basal diam:  3.00 cm RV S prime:     16.40 cm/s TAPSE (M-mode): 2.9 cm LEFT ATRIUM             Index        RIGHT ATRIUM           Index LA diam:        4.00 cm 1.84 cm/m   RA Area:     13.40 cm LA Vol (A2C):   75.8 ml 34.93 ml/m  RA Volume:   31.60 ml  14.56 ml/m LA Vol (A4C):   57.6 ml 26.55 ml/m LA Biplane Vol: 70.9 ml 32.68 ml/m  AORTIC VALVE AV Area (Vmax):    2.92 cm AV Area (Vmean):   2.93 cm AV Area (VTI):     2.47 cm AV  Vmax:           133.00 cm/s AV Vmean:          92.700 cm/s AV VTI:            0.269 m AV Peak Grad:      7.1 mmHg AV Mean Grad:      4.0 mmHg LVOT Vmax:         112.00 cm/s LVOT Vmean:        78.300 cm/s LVOT VTI:          0.192 m LVOT/AV VTI ratio: 0.71  AORTA Ao Root diam: 3.10 cm MITRAL VALVE MV Area VTI:  2.62 cm    SHUNTS MV Peak grad: 11.2 mmHg   Systemic VTI:  0.19 m MV Mean grad: 5.0 mmHg    Systemic Diam: 2.10 cm MV Vmax:      1.67 m/s MV Vmean:     101.0 cm/s Kirk Ruths MD Electronically signed by Kirk Ruths MD Signature Date/Time: 12/07/2021/8:50:48 AM    Final    DG Chest Portable 1 View  Result Date: 12/06/2021 CLINICAL DATA:  Chest pain and shortness of breath EXAM: PORTABLE CHEST 1 VIEW COMPARISON:  None FINDINGS: Cardiac shadow is at the upper limits of normal in size. Diffuse right basilar infiltrate is noted consistent with pneumonia. No sizable effusion is seen. No bony abnormality is noted. IMPRESSION: Right basilar pneumonia Electronically Signed   By: Inez Catalina M.D.   On: 12/06/2021 20:29    Cardiac Studies   Cardiac catheterization 12/07/2021 Dr. Angelena Form:   The LAD is a large caliber vessel that courses to the apex. The mid LAD stented segment is patent with minimal restenosis.  2.   The Circumflex is a moderate caliber vessel with two small obtuse marginal branches. The proximal Circumflex has mild to moderate stenosis. The mid Circumflex has moderate stenosis just beyond the obtuse marginal branch. The first obtuse marginal branch has sub-total occlusion but fills from left to left collaterals.  3.  The RCA is a large dominant artery with severe ostial stenosis. The  mid vessel stented segment is patent. Moderate disease in the left posterolateral artery.  4.  LVEDP 21 mmHg 5.  45 cc contrast dye used   Diagnostic Dominance: Right    Recommendations: We could consider staged PCI of the ostial RCA. There is at least mild calcification of this lesion but PCI is  an option. I would treat the disease in his Circumflex medically. Given his advanced kidney disease and his refusal to consider dialysis if it were necessary, we may opt to treat his CAD medically for now. Resume IV heparin 2 hours post radial sheath removal.   Echocardiogram 12/07/2021:-EF 35 to 40% with inferior wall akinesis.  Patient Profile     71 y.o. male with coronary artery disease prior MI in 1999 with stenting RCA and staged DES to LAD with ischemic cardiomyopathy prior EF 30 to 35% in 2012 poorly controlled hypertension hyperlipidemia CKD stage IV, Dr. Theador Hawthorne, with heavy proteinuria previously declining biopsy, diabetes, tobacco use, new left bundle branch block elevated troponin with chest pain this admission.  Assessment & Plan    Non-ST elevation myocardial infarction - Diagnostic cardiac catheterization 12/07/2021 with ostial calcified 90% RCA lesion.  Previously placed RCA and mid LAD stents were only minimal stenosis.  Circumflex showed 99% proximal stenosis.  Plan discussed as above.  Chronic kidney disease stage IV does increase risks of future intervention however troponin has increased from 373 up to 4800.  I think it does make sense to proceed with ostial RCA intervention on Monday.  He had post catheterization chest discomfort. -IV heparin was resumed 2 hours post radial sheath pull. -He asked why did these blockages occur if he is taking his medication.  Did ask him if he was continuing to smoke or has diabetes, admits to this.  Continue to work on prevention efforts.  Chronic kidney disease stage IV - Creatinine on admission 4.13, currently 3.09.  Improved.  Back in June it was 2.2.  On 11/2018 was 1.35.  Progressive disease.  Seen by nephrology.  Does not wish to proceed with dialysis in the future.  He is at risk for further progression of chronic kidney disease.  Understands.  Tobacco use - Continue to encourage cessation.  Left bundle branch block - Newly  discovered  Ischemic cardiomyopathy/chronic systolic heart failure - Ejection fraction 35 to 40% with inferior wall akinesis.  Goal-directed medical therapy as possible, limited by chronic kidney disease.  Appears euvolemic.  Has only mild lower extremity edema.  States that he does not usually have this.  Given his tachycardia, I will add low-dose Toprol 25 mg once a day. LVEDP 21 mmHg.  Since he is not short of breath and he has underlying chronic kidney disease stage IV, we will hold off on Lasix.   For questions or updates, please contact [place] Please consult [email or web address] for contact info under        Signed, Candee Furbish, MD  12/08/2021, 9:00 AM

## 2021-12-08 NOTE — Progress Notes (Signed)
[place] for Heparin  Indication: chest pain/ACS  Allergies  Allergen Reactions   Nitroglycerin     NOT an allergy - just FYI patient takes tadalafil regularly so please review last dose    Patient Measurements: Height: 6\' 1"  (185.4 cm) Weight: 92.5 kg (204 lb) IBW/kg (Calculated) : 79.9  Vital Signs: Temp: 98.6 F (37 C) (12/01 1950) Temp Source: Oral (12/01 1950) BP: 157/65 (12/01 2243) Pulse Rate: 96 (12/01 2243)  Labs: Recent Labs    12/06/21 1950 12/06/21 2100 12/07/21 0040 12/07/21 0201 12/07/21 0851 12/08/21 0326  HGB 12.7*   < > 10.7*  --  12.0* 10.0*  HCT 39.7   < > 33.5*  --  35.8* 30.1*  PLT 198  --  130*  --  161 139*  LABPROT 13.8  --   --   --   --   --   INR 1.1  --   --   --   --   --   HEPARINUNFRC  --   --   --   --   --  0.32  CREATININE 4.13*  --  3.72*  --   --  3.09*  TROPONINIHS 36*   < > 1,730* 3,199* 4,829*  --    < > = values in this interval not displayed.     Estimated Creatinine Clearance: 24.1 mL/min (A) (by C-G formula based on SCr of 3.09 mg/dL (H)).   Assessment: 14 YOM s/p cath with consideration for staged PCI of ostial RCA and plan to treat the circumflex medically.  Pharmacy consulted to resume IV heparin 2 hours post radial band removal.  Radial band removed around 1700 per discussion with RN; no bleeding nor hematoma observed.  12/2 AM update:  Heparin level therapeutic   Goal of Therapy:  Heparin level 0.3-0.7 units/ml Monitor platelets by anticoagulation protocol: Yes   Plan:  Cont heparin 1200 units/hr Re-check heparin level in 8 hours  Narda Bonds, PharmD, [place] Pharmacist Phone: [phone]

## 2021-12-08 NOTE — Progress Notes (Signed)
[place] for Heparin  Indication: chest pain/ACS  Allergies  Allergen Reactions   Nitroglycerin     NOT an allergy - just FYI patient takes tadalafil regularly so please review last dose    Patient Measurements: Height: 6\' 1"  (185.4 cm) Weight: 88.3 kg (194 lb 9.6 oz) IBW/kg (Calculated) : 79.9  Vital Signs: Temp: 98.6 F (37 C) (12/02 1616) Temp Source: Oral (12/02 1616) BP: 147/78 (12/02 1616) Pulse Rate: 97 (12/02 1616)  Labs: Recent Labs    12/06/21 1950 12/06/21 2100 12/07/21 0040 12/07/21 0201 12/07/21 0851 12/08/21 0326 12/08/21 0846 12/08/21 1651  HGB 12.7*   < > 10.7*  --  12.0* 10.0*  --   --   HCT 39.7   < > 33.5*  --  35.8* 30.1*  --   --   PLT 198  --  130*  --  161 139*  --   --   LABPROT 13.8  --   --   --   --   --   --   --   INR 1.1  --   --   --   --   --   --   --   HEPARINUNFRC  --   --   --   --   --  0.32  --  0.28*  CREATININE 4.13*  --  3.72*  --   --  3.09*  --   --   TROPONINIHS 36*   < > 1,730* 3,199* 4,829*  --  4,110*  --    < > = values in this interval not displayed.     Estimated Creatinine Clearance: 24.1 mL/min (A) (by C-G formula based on SCr of 3.09 mg/dL (H)).   Assessment: 24 YOM s/p cath with consideration for staged PCI of ostial RCA and plan to treat the circumflex medically.  Pharmacy consulted to resume IV heparin 2 hours post radial band removal.  Radial band removed around 1700 per discussion with RN; no bleeding nor hematoma observed.  Follow up heparin level now just below goal at 0.28 on 1200 units/hr. No bleeding or IV issues noted.   Goal of Therapy:  Heparin level 0.3-0.7 units/ml Monitor platelets by anticoagulation protocol: Yes   Plan:  Increase heparin to 1300 units/hr Re-check heparin level in am  Erin Hearing PharmD., BCPS Clinical Pharmacist 12/08/2021 5:38 PM

## 2021-12-09 LAB — CBC
HCT: 30.8 % — ABNORMAL LOW (ref 39.0–52.0)
Hemoglobin: 10.2 g/dL — ABNORMAL LOW (ref 13.0–17.0)
MCH: 31.5 pg (ref 26.0–34.0)
MCHC: 33.1 g/dL (ref 30.0–36.0)
MCV: 95.1 fL (ref 80.0–100.0)
Platelets: 147 10*3/uL — ABNORMAL LOW (ref 150–400)
RBC: 3.24 MIL/uL — ABNORMAL LOW (ref 4.22–5.81)
RDW: 12.2 % (ref 11.5–15.5)
WBC: 8.5 10*3/uL (ref 4.0–10.5)
nRBC: 0 % (ref 0.0–0.2)

## 2021-12-09 LAB — BASIC METABOLIC PANEL
Anion gap: 10 (ref 5–15)
BUN: 52 mg/dL — ABNORMAL HIGH (ref 8–23)
CO2: 18 mmol/L — ABNORMAL LOW (ref 22–32)
Calcium: 9 mg/dL (ref 8.9–10.3)
Chloride: 112 mmol/L — ABNORMAL HIGH (ref 98–111)
Creatinine, Ser: 3.25 mg/dL — ABNORMAL HIGH (ref 0.61–1.24)
GFR, Estimated: 19 mL/min — ABNORMAL LOW (ref >60)
Glucose, Bld: 110 mg/dL — ABNORMAL HIGH (ref 70–99)
Potassium: 4.1 mmol/L (ref 3.5–5.1)
Sodium: 140 mmol/L (ref 135–145)

## 2021-12-09 LAB — GLUCOSE, CAPILLARY
Glucose-Capillary: 120 mg/dL — ABNORMAL HIGH (ref 70–99)
Glucose-Capillary: 127 mg/dL — ABNORMAL HIGH (ref 70–99)
Glucose-Capillary: 153 mg/dL — ABNORMAL HIGH (ref 70–99)

## 2021-12-09 LAB — HEPARIN LEVEL (UNFRACTIONATED)
Heparin Unfractionated: 0.26 IU/mL — ABNORMAL LOW (ref 0.30–0.70)
Heparin Unfractionated: 0.36 IU/mL (ref 0.30–0.70)

## 2021-12-09 MED ORDER — AMLODIPINE BESYLATE 10 MG PO TABS
10.0000 mg | ORAL_TABLET | Freq: Every day | ORAL | Status: DC
  Administered 2021-12-09 – 2021-12-13 (×5): 10 mg via ORAL
  Filled 2021-12-09 (×5): qty 1

## 2021-12-09 MED ORDER — SODIUM CHLORIDE 0.9% FLUSH: 3.0000 mL | INTRAVENOUS | Status: DC | PRN

## 2021-12-09 MED ORDER — SODIUM CHLORIDE 0.9% FLUSH
3.0000 mL | Freq: Two times a day (BID) | INTRAVENOUS | Status: DC
  Administered 2021-12-10: 3 mL via INTRAVENOUS

## 2021-12-09 MED ORDER — ASPIRIN 81 MG PO CHEW
81.0000 mg | CHEWABLE_TABLET | ORAL | Status: AC
  Administered 2021-12-10: 81 mg via ORAL

## 2021-12-09 MED ORDER — SODIUM BICARBONATE 8.4 % IV SOLN
INTRAVENOUS | Status: DC
  Filled 2021-12-09: qty 150

## 2021-12-09 MED ORDER — SODIUM BICARBONATE 8.4 % IV SOLN
INTRAVENOUS | Status: DC
  Filled 2021-12-09: qty 1000

## 2021-12-09 MED ORDER — CARVEDILOL 25 MG PO TABS
25.0000 mg | ORAL_TABLET | Freq: Two times a day (BID) | ORAL | Status: DC
  Administered 2021-12-09 – 2021-12-13 (×9): 25 mg via ORAL
  Filled 2021-12-09 (×9): qty 1

## 2021-12-09 MED ORDER — SODIUM CHLORIDE 0.9 % IV SOLN: 250.0000 mL | INTRAVENOUS | Status: DC | PRN

## 2021-12-09 NOTE — Progress Notes (Addendum)
ANTICOAGULATION CONSULT NOTE - Follow Up Consult  Pharmacy Consult for Heparin Indication: chest pain/ACS  Allergies  Allergen Reactions   Nitroglycerin     NOT an allergy - just FYI patient takes tadalafil regularly so please review last dose    Patient Measurements: Height: 6\' 1"  (185.4 cm) Weight: 86 kg (189 lb 9.5 oz) IBW/kg (Calculated) : 79.9 Heparin Dosing Weight: 86 kg  Vital Signs: Temp: 97.9 F (36.6 C) (12/03 1125) Temp Source: Oral (12/03 1125) BP: 165/81 (12/03 1125) Pulse Rate: 88 (12/03 1125)  Labs: Recent Labs    12/06/21 1950 12/06/21 2100 12/07/21 0040 12/07/21 0201 12/07/21 0851 12/07/21 0851 12/08/21 0326 12/08/21 0846 12/08/21 1651 12/09/21 0122 12/09/21 1050  HGB 12.7*   < > 10.7*  --  12.0*  --  10.0*  --   --  10.2*  --   HCT 39.7   < > 33.5*  --  35.8*  --  30.1*  --   --  30.8*  --   PLT 198  --  130*  --  161  --  139*  --   --  147*  --   LABPROT 13.8  --   --   --   --   --   --   --   --   --   --   INR 1.1  --   --   --   --   --   --   --   --   --   --   HEPARINUNFRC  --   --   --   --   --    < > 0.32  --  0.28* 0.26* 0.36  CREATININE 4.13*  --  3.72*  --   --   --  3.09*  --   --  3.25*  --   TROPONINIHS 36*   < > 1,730* 3,199* 4,829*  --   --  4,110*  --   --   --    < > = values in this interval not displayed.    Estimated Creatinine Clearance: 22.9 mL/min (A) (by C-G formula based on SCr of 3.25 mg/dL (H)).  Assessment: 40 YOM s/p cath with consideration for staged PCI of ostial RCA and plan to treat the circumflex medically. Pharmacy consulted to resume IV heparin 2 hours post radial band removal on 12/07/21. Radial band removed around 1700 per discussion with RN; no bleeding nor hematoma observed.    Heparin level was just below goal (0.26) on 1300 units/hr ~3am > now therapeutic (0.36) on 1400 units/hr.  Hgb stable, platelet 198K on admit, count down to 139 on 12/2 > no further drop, 147K today.     Goal of Therapy:   Heparin level 0.3-0.7 units/ml Monitor platelets by anticoagulation protocol: Yes   Plan:  Continue heparin drip at 1400 units/hr Daily heparin level and CBC while on heparin. Anticipating intervention in cath lab on 12/4.  Arty Baumgartner, RPh 12/09/2021,1:41 PM

## 2021-12-09 NOTE — Progress Notes (Signed)
[place] for Heparin  Indication: chest pain/ACS  Allergies  Allergen Reactions   Nitroglycerin     NOT an allergy - just FYI patient takes tadalafil regularly so please review last dose    Patient Measurements: Height: 6\' 1"  (185.4 cm) Weight: 88.3 kg (194 lb 9.6 oz) IBW/kg (Calculated) : 79.9  Vital Signs: Temp: 98.6 F (37 C) (12/02 2344) Temp Source: Oral (12/02 2344) BP: 166/89 (12/02 2344) Pulse Rate: 98 (12/02 2344)  Labs: Recent Labs    12/06/21 1950 12/06/21 2100 12/07/21 0040 12/07/21 0201 12/07/21 0851 12/08/21 0326 12/08/21 0846 12/08/21 1651 12/09/21 0122  HGB 12.7*   < > 10.7*  --  12.0* 10.0*  --   --  10.2*  HCT 39.7   < > 33.5*  --  35.8* 30.1*  --   --  30.8*  PLT 198  --  130*  --  161 139*  --   --  147*  LABPROT 13.8  --   --   --   --   --   --   --   --   INR 1.1  --   --   --   --   --   --   --   --   HEPARINUNFRC  --   --   --   --   --  0.32  --  0.28* 0.26*  CREATININE 4.13*  --  3.72*  --   --  3.09*  --   --  3.25*  TROPONINIHS 36*   < > 1,730* 3,199* 4,829*  --  4,110*  --   --    < > = values in this interval not displayed.     Estimated Creatinine Clearance: 22.9 mL/min (A) (by C-G formula based on SCr of 3.25 mg/dL (H)).   Assessment: 71 YOM s/p cath with consideration for staged PCI of ostial RCA and plan to treat the circumflex medically.  Pharmacy consulted to resume IV heparin 2 hours post radial band removal.  Radial band removed around 1700 per discussion with RN; no bleeding nor hematoma observed.  12/3 AM update:  Heparin level just below goal  Goal of Therapy:  Heparin level 0.3-0.7 units/ml Monitor platelets by anticoagulation protocol: Yes   Plan:  Inc heparin to 1400 units/hr Re-check heparin level in 8 hours  Narda Bonds, PharmD, [place] Pharmacist Phone: [phone]

## 2021-12-09 NOTE — Progress Notes (Addendum)
Progress Note   Patient: Austin Weber JJK:[id number] DOB: January 09, 1950 DOA: 12/06/2021     3 DOS: the patient was seen and examined on 12/09/2021   Brief hospital course: Mr. Gheen was admitted to the hospital with the working diagnosis of community acquired pneumonia, complicated with respiratory failure.   71 yo male with the past medical history of CKD stage 3b, T2DM, heart failure and hypertension who presented with dyspnea. Patient at home developed severe dyspnea, EMS was called and he was found in respiratory distress with 02 saturation in the 70's. Patient was transported the ED and was placed on non invasive mechanical ventilation. His blood pressure in the ED 148/77. HR 104, RR 22 and 02 saturation 98% on bipap. Lungs with rales but not wheezing. Heart with S1 and S2 present and tachycardic with no gallops, abdomen with no distention, no lower extremity edema.   ABG 7,24/ 45/ 207/ 21/ 100% Na 138, K 4,6 Cl 110, bicarbonate 18, glucose 353 BUN 62, CR 4.13 BNP 509  High sensitive troponin 36, 373, 1,730, 3,199, 4,829  Wbc 14,5 hgb 12.0 plt 198  Sars covid 19 negative   Chest radiograph with cardiomegaly with dense right lower lobe patchy infiltrate with positive air bronchogram.   EKG 131 bpm, normal axis, left bundle branch block, SVT with no significant ST segment or T wave changes   12/01 Patient placed on broad spectrum antibiotic therapy and heparin drip. Cardiac catheterization with diffuse coronary artery disease, possible PCI to ostial RCA during this hospitalization at a later time when renal function more stable.  12/03 no chest pain and responding well to antibiotic therapy, plan for coronary angiography and PCI tomorrow.   Assessment and Plan: * CAP (community acquired pneumonia) Patient with right lower lobe infiltrate. Acute hypoxemic and hypercapnic respiratory failure.  Severe sepsis present on admission.   Patient required Bipap on admission, now he has been  transitioned to supplemental 02 per [place]. 02 saturation is 96 % on room air.   Antibiotic therapy with IV ceftriaxone and oral azithromycin.  As needed bronchodilator therapy Airway clearing techniques with flutter valve and incentive spirometer.   NSTEMI (non-ST elevated myocardial infarction) ([place]) EKG with new left bundle branch block, echocardiogram with positive well motion abnormalities.   Medical therapy with IV heparin, aspirin and atorvastatin.  B blockade with carvedilol 25 mg bid.  No further chest pain, continue with as needed hydromorphone and oxycodone.  Possible PCI to ostial RCA tomorrow.    Acute systolic heart failure (HCC) Echocardiogram with reduced LV systolic function 35 to 37%, moderate LVH, RV systolic function preserved. Small pericardial effusion with mild RV diastolic collapse. No significant valvular disease. Akinesis of the inferolateral wall with moderate LV dysfunction.  No signs of volume overload Continue medical therapy for acute coronary syndrome Systolic blood pressure 169 to 170 mmHg. Limited pharmacologic options due to reduced GFR. Continue with carvedilol.   Acute kidney injury superimposed on chronic kidney disease ([place]) CKD stage 3 b (base cr at 27 old records personally reviewed).   Patient tolerating po well, he has been of IV fluids. Renal function with serum cr at 3,25 with K at 4,1 and serum bicarbonate at 18. Cl 112.  Anion gap is 10.   Plan for IV fluids with isotonic fluids this pm to prevent contrast induced nephropathy. Will use bicarb drip considering patient has a non anion gap metabolic acidosis and hyperchloremia.   Hypertension associated with diabetes ([place]) Continue blood pressure monitoring  Added  amlodipine for better blood pressure control and continue B blockade with carvedilol.   Type 2 diabetes mellitus with hyperlipidemia (HCC) Continue insulin sliding scale for glucose cover and monitoring. Fasting glucose this am  110 mg/dl.  Continue with statin therapy.    Cigarette nicotine dependence without complication Smoking cessation counseling.         Subjective: Patient with no chest pain, dyspnea has been improving, no nausea or vomiting.   Physical Exam: Vitals:   12/08/21 2344 12/09/21 0510 12/09/21 0802 12/09/21 1125  BP: (!) 166/89 (!) 153/111 (!) 170/85 (!) 165/81  Pulse: 98 (!) 102 100 88  Resp: 20 20 18 16   Temp: 98.6 F (37 C) 98.2 F (36.8 C) 97.9 F (36.6 C) 97.9 F (36.6 C)  TempSrc: Oral Oral Oral Oral  SpO2: 93% 94% 94% 96%  Weight:  86 kg    Height:       Neurology awake and alert ENT with mild pallor Cardiovascular with S1 and S2 present and rhythmic with no gallops, rubs or murmurs No JVD No lower extremity edema Respiratory with mid rales at the right base, with no rhonchi or wheezing Abdomen with no distention  Data Reviewed:    Family Communication: no family at the bedside   Disposition: Status is: Inpatient Remains inpatient appropriate because: IV heparin and IV antibiotics, plan for cardiac catheterization   Planned Discharge Destination: Home      Author: Tawni Millers, MD 12/09/2021 2:39 PM  For on call review [email or web address].

## 2021-12-09 NOTE — Progress Notes (Signed)
Mobility Specialist - Progress Note   12/09/21 1300  Mobility  Activity Refused mobility   Pt refused mobility despite max encouragement. Will follow up if time permits  Franki Monte  Mobility Specialist Please contact via [facility] or Rehab office at [phone]

## 2021-12-09 NOTE — Progress Notes (Signed)
Rounding Note    Patient Name: Austin Weber Date of Encounter: 12/09/2021  [place] Cardiologist: None Dr. Gwenlyn Found  Subjective   Did experience an post catheterization chest discomfort and received IV Dilaudid.  EKG was no change but he did have sinus tachycardia rate of 117.  Left bundle branch block persistent.  Now more comfortable.  Denies any recent chest discomfort.  Inpatient Medications    Scheduled Meds:  aspirin  81 mg Oral Daily   atorvastatin  80 mg Oral Daily   azithromycin  500 mg Oral Daily   insulin aspart  0-9 Units Subcutaneous Q4H   metoprolol succinate  25 mg Oral Daily   sodium chloride flush  3 mL Intravenous Q12H   sodium chloride flush  3 mL Intravenous Q12H   Continuous Infusions:  sodium chloride     cefTRIAXone (ROCEPHIN)  IV 2 g (12/09/21 0838)   heparin 1,400 Units/hr (12/09/21 0416)   PRN Meds: sodium chloride, HYDROmorphone (DILAUDID) injection, ipratropium-albuterol, oxyCODONE, sodium chloride flush   Vital Signs    Vitals:   12/08/21 1616 12/08/21 2044 12/08/21 2344 12/09/21 0510  BP: (!) 147/78 (!) 161/79 (!) 166/89 (!) 153/111  Pulse: 97 99 98 (!) 102  Resp: 20 20 20 20   Temp: 98.6 F (37 C) 98.5 F (36.9 C) 98.6 F (37 C) 98.2 F (36.8 C)  TempSrc: Oral Oral Oral Oral  SpO2: 94% 94% 93% 94%  Weight:    86 kg  Height:        Intake/Output Summary (Last 24 hours) at 12/09/2021 0845 Last data filed at 12/09/2021 0724 Gross per 24 hour  Intake 405.64 ml  Output 2725 ml  Net -2319.36 ml      12/09/2021    5:10 AM 12/08/2021    6:14 AM 12/06/2021    8:02 PM  Last 3 Weights  Weight (lbs) 189 lb 9.5 oz 194 lb 9.6 oz 204 lb  Weight (kg) 86 kg 88.27 kg 92.534 kg      Telemetry    Heart rate currently in the 90s.- Personally Reviewed  ECG    Sinus rhythm left bundle branch block- Personally Reviewed  Physical Exam   GEN: No acute distress.   Neck: No JVD Cardiac: Regular rate and rhythm, no murmurs,  rubs, or gallops.  Radial artery cath site clean dry and intact Respiratory: Clear to auscultation bilaterally. GI: Soft, nontender, non-distended  MS: 1+ bilateral lower extremity edema; No deformity. Neuro:  Nonfocal  Psych: Normal affect   Labs    High Sensitivity Troponin:   Recent Labs  Lab 12/06/21 2145 12/07/21 0040 12/07/21 0201 12/07/21 0851 12/08/21 0846  TROPONINIHS 373* 1,730* 3,199* 4,829* 4,110*     Chemistry Recent Labs  Lab 12/07/21 0040 12/08/21 0326 12/09/21 0122  NA 140 140 140  K 4.5 4.2 4.1  CL 108 113* 112*  CO2 17* 18* 18*  GLUCOSE 289* 117* 110*  BUN 63* 48* 52*  CREATININE 3.72* 3.09* 3.25*  CALCIUM 9.0 9.2 9.0  MG 2.0  --   --   PROT 5.5*  --   --   ALBUMIN 2.8*  --   --   AST 23  --   --   ALT 20  --   --   ALKPHOS 57  --   --   BILITOT 0.2*  --   --   GFRNONAA 16* 21* 19*  ANIONGAP 15 9 10     Lipids  Recent Labs  Lab  12/07/21 0851  CHOL 134  TRIG 52  HDL 43  LDLCALC 81  CHOLHDL 3.1    Hematology Recent Labs  Lab 12/07/21 0851 12/08/21 0326 12/09/21 0122  WBC 12.4* 8.3 8.5  RBC 3.75* 3.17* 3.24*  HGB 12.0* 10.0* 10.2*  HCT 35.8* 30.1* 30.8*  MCV 95.5 95.0 95.1  MCH 32.0 31.5 31.5  MCHC 33.5 33.2 33.1  RDW 12.4 12.4 12.2  PLT 161 139* 147*   Thyroid  Recent Labs  Lab 12/07/21 0851  TSH 2.371    BNP Recent Labs  Lab 12/06/21 1950  BNP 509.9*    DDimer No results for input(s): "DDIMER" in the last 168 hours.   Radiology    CARDIAC CATHETERIZATION  Result Date: 12/07/2021   Mid RCA lesion is 20% stenosed.   Prox RCA lesion is 30% stenosed.   Ost RCA to Prox RCA lesion is 90% stenosed.   3rd RPL lesion is 60% stenosed.   Prox Cx lesion is 40% stenosed.   1st Mrg lesion is 99% stenosed.   Prox Cx to Mid Cx lesion is 70% stenosed.   Mid LAD lesion is 10% stenosed. The LAD is a large caliber vessel that courses to the apex. The mid LAD stented segment is patent with minimal restenosis. 2.   The Circumflex is a  moderate caliber vessel with two small obtuse marginal branches. The proximal Circumflex has mild to moderate stenosis. The mid Circumflex has moderate stenosis just beyond the obtuse marginal branch. The first obtuse marginal branch has sub-total occlusion but fills from left to left collaterals. 3.  The RCA is a large dominant artery with severe ostial stenosis. The mid vessel stented segment is patent. Moderate disease in the left posterolateral artery. 4.  LVEDP 21 mmHg 5.  45 cc contrast dye used Recommendations: We could consider staged PCI of the ostial RCA. There is at least mild calcification of this lesion but PCI is an option. I would treat the disease in his Circumflex medically. Given his advanced kidney disease and his refusal to consider dialysis if it were necessary, we may opt to treat his CAD medically for now. Resume IV heparin 2 hours post radial sheath removal.    Cardiac Studies   Cardiac catheterization 12/07/2021 Dr. Angelena Form:   The LAD is a large caliber vessel that courses to the apex. The mid LAD stented segment is patent with minimal restenosis.  2.   The Circumflex is a moderate caliber vessel with two small obtuse marginal branches. The proximal Circumflex has mild to moderate stenosis. The mid Circumflex has moderate stenosis just beyond the obtuse marginal branch. The first obtuse marginal branch has sub-total occlusion but fills from left to left collaterals.  3.  The RCA is a large dominant artery with severe ostial stenosis. The mid vessel stented segment is patent. Moderate disease in the left posterolateral artery.  4.  LVEDP 21 mmHg 5.  45 cc contrast dye used   Diagnostic Dominance: Right    Recommendations: We could consider staged PCI of the ostial RCA. There is at least mild calcification of this lesion but PCI is an option. I would treat the disease in his Circumflex medically. Given his advanced kidney disease and his refusal to consider dialysis if it were  necessary, we may opt to treat his CAD medically for now. Resume IV heparin 2 hours post radial sheath removal.   Echocardiogram 12/07/2021:-EF 35 to 40% with inferior wall akinesis.  Patient Profile  71 y.o. male with coronary artery disease prior MI in 1999 with stenting RCA and staged DES to LAD with ischemic cardiomyopathy prior EF 30 to 35% in 2012 poorly controlled hypertension hyperlipidemia CKD stage IV, Dr. Theador Hawthorne, with heavy proteinuria previously declining biopsy, diabetes, tobacco use, new left bundle branch block elevated troponin with chest pain this admission.  Assessment & Plan    Non-ST elevation myocardial infarction - Diagnostic cardiac catheterization 12/07/2021 with ostial calcified 90% RCA lesion.  Previously placed RCA and mid LAD stents were only minimal stenosis.  Circumflex showed 99% proximal stenosis.  Plan discussed as above.  Chronic kidney disease stage IV does increase risks of future intervention however troponin has increased from 373 up to 4800.  I think it does make sense to proceed with ostial RCA intervention on Monday.  He had post catheterization chest discomfort. -IV heparin was resumed 2 hours post radial sheath pull. -He asked why did these blockages occur if he is taking his medication.  Did ask him if he was continuing to smoke or has diabetes, admits to this.  Continue to work on prevention efforts.  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  Improved.  Back in June it was 2.2.  On 11/2018 was 1.35.  Progressive disease.  Seen by nephrology.  Does not wish to proceed with dialysis in the future.  He is at risk for further progression of chronic kidney disease.  Understands discussion.  He is amenable to proceed. -Resumed amlodipine 10 mg from home as well as carvedilol 25 mg twice a day on 12/09/2021.  Tobacco use - Continue to encourage cessation.  Counseling  Left bundle branch block -  Newly discovered  Ischemic cardiomyopathy/chronic systolic heart failure - Ejection fraction 35 to 40% with inferior wall akinesis.  Goal-directed medical therapy as possible, limited by chronic kidney disease.  Appears euvolemic.  Has only mild lower extremity edema.  States that he does not usually have this.  Given his tachycardia, added low-dose Toprol 25 mg once a day on 12/08/2021.  This could be reflex tachycardia after stopping his carvedilol 25 twice a day.  I will go ahead and give him back his home medication carvedilol 25 mg twice a day.  LVEDP 21 mmHg.  Since he is not short of breath and he has underlying chronic kidney disease stage IV, we will hold off on Lasix.  We will give him gentle fluids starting 8 PM tonight for procedure Monday.   For questions or updates, please contact [place] Please consult [email or web address] for contact info under        Signed, Candee Furbish, MD  12/09/2021, 8:45 AM

## 2021-12-10 ENCOUNTER — Inpatient Hospital Stay (HOSPITAL_COMMUNITY)
Admission: EM | Disposition: A | Discharge status: Home / Self Care | Payer: Self-pay | Source: Home / Self Care | Attending: Internal Medicine

## 2021-12-10 ENCOUNTER — Encounter (HOSPITAL_COMMUNITY): Payer: Self-pay | Admitting: Cardiovascular Disease

## 2021-12-10 HISTORY — PX: INTRAVASCULAR ULTRASOUND/IVUS: CATH118244

## 2021-12-10 HISTORY — PX: LEFT HEART CATH AND CORONARY ANGIOGRAPHY: CATH118249

## 2021-12-10 HISTORY — PX: CORONARY STENT INTERVENTION: CATH118234

## 2021-12-10 LAB — CBC
HCT: 28.7 % — ABNORMAL LOW (ref 39.0–52.0)
Hemoglobin: 9.7 g/dL — ABNORMAL LOW (ref 13.0–17.0)
MCH: 31.3 pg (ref 26.0–34.0)
MCHC: 33.8 g/dL (ref 30.0–36.0)
MCV: 92.6 fL (ref 80.0–100.0)
Platelets: 138 10*3/uL — ABNORMAL LOW (ref 150–400)
RBC: 3.1 MIL/uL — ABNORMAL LOW (ref 4.22–5.81)
RDW: 12.2 % (ref 11.5–15.5)
WBC: 8.5 10*3/uL (ref 4.0–10.5)
nRBC: 0 % (ref 0.0–0.2)

## 2021-12-10 LAB — BASIC METABOLIC PANEL
Anion gap: 9 (ref 5–15)
BUN: 53 mg/dL — ABNORMAL HIGH (ref 8–23)
CO2: 18 mmol/L — ABNORMAL LOW (ref 22–32)
Calcium: 8.5 mg/dL — ABNORMAL LOW (ref 8.9–10.3)
Chloride: 111 mmol/L (ref 98–111)
Creatinine, Ser: 2.93 mg/dL — ABNORMAL HIGH (ref 0.61–1.24)
GFR, Estimated: 22 mL/min — ABNORMAL LOW (ref >60)
Glucose, Bld: 148 mg/dL — ABNORMAL HIGH (ref 70–99)
Potassium: 4.1 mmol/L (ref 3.5–5.1)
Sodium: 138 mmol/L (ref 135–145)

## 2021-12-10 LAB — POCT ACTIVATED CLOTTING TIME
Activated Clotting Time: 260 seconds
Activated Clotting Time: 260 seconds
Activated Clotting Time: 266 seconds

## 2021-12-10 LAB — HEPARIN LEVEL (UNFRACTIONATED): Heparin Unfractionated: 0.4 IU/mL (ref 0.30–0.70)

## 2021-12-10 SURGERY — CORONARY STENT INTERVENTION
Anesthesia: LOCAL

## 2021-12-10 MED ORDER — MIDAZOLAM HCL 2 MG/2ML IJ SOLN
INTRAMUSCULAR | Status: DC | PRN
  Administered 2021-12-10: 1 mg via INTRAVENOUS

## 2021-12-10 MED ORDER — MIDAZOLAM HCL 2 MG/2ML IJ SOLN
INTRAMUSCULAR | Status: AC
  Filled 2021-12-10: qty 2

## 2021-12-10 MED ORDER — FENTANYL CITRATE (PF) 100 MCG/2ML IJ SOLN
INTRAMUSCULAR | Status: AC
  Filled 2021-12-10: qty 2

## 2021-12-10 MED ORDER — SODIUM CHLORIDE 0.9 % IV SOLN: 250.0000 mL | INTRAVENOUS | Status: DC | PRN

## 2021-12-10 MED ORDER — HEPARIN (PORCINE) IN NACL 1000-0.9 UT/500ML-% IV SOLN
INTRAVENOUS | Status: DC | PRN
  Administered 2021-12-10 (×2): 500 mL

## 2021-12-10 MED ORDER — HEPARIN (PORCINE) IN NACL 1000-0.9 UT/500ML-% IV SOLN
INTRAVENOUS | Status: AC
  Filled 2021-12-10: qty 500

## 2021-12-10 MED ORDER — FENTANYL CITRATE (PF) 100 MCG/2ML IJ SOLN
INTRAMUSCULAR | Status: DC | PRN
  Administered 2021-12-10: 25 ug via INTRAVENOUS

## 2021-12-10 MED ORDER — NITROGLYCERIN 1 MG/10 ML FOR IR/CATH LAB
INTRA_ARTERIAL | Status: DC | PRN
  Administered 2021-12-10: 200 ug via INTRACORONARY

## 2021-12-10 MED ORDER — HYDRALAZINE HCL 20 MG/ML IJ SOLN: 10.0000 mg | INTRAMUSCULAR | Status: AC | PRN

## 2021-12-10 MED ORDER — LIDOCAINE HCL (PF) 1 % IJ SOLN
INTRAMUSCULAR | Status: DC | PRN
  Administered 2021-12-10: 2 mL

## 2021-12-10 MED ORDER — CLOPIDOGREL BISULFATE 300 MG PO TABS
ORAL_TABLET | ORAL | Status: DC | PRN
  Administered 2021-12-10: 600 mg via ORAL

## 2021-12-10 MED ORDER — CLOPIDOGREL BISULFATE 300 MG PO TABS
ORAL_TABLET | ORAL | Status: AC
  Filled 2021-12-10: qty 2

## 2021-12-10 MED ORDER — CLOPIDOGREL BISULFATE 75 MG PO TABS
75.0000 mg | ORAL_TABLET | Freq: Every day | ORAL | Status: DC
  Administered 2021-12-11: 75 mg via ORAL
  Filled 2021-12-10: qty 1

## 2021-12-10 MED ORDER — HEPARIN SODIUM (PORCINE) 1000 UNIT/ML IJ SOLN
INTRAMUSCULAR | Status: AC
  Filled 2021-12-10: qty 10

## 2021-12-10 MED ORDER — FAMOTIDINE IN NACL 20-0.9 MG/50ML-% IV SOLN
INTRAVENOUS | Status: AC
  Filled 2021-12-10: qty 50

## 2021-12-10 MED ORDER — HEPARIN SODIUM (PORCINE) 1000 UNIT/ML IJ SOLN
INTRAMUSCULAR | Status: DC | PRN
  Administered 2021-12-10: 8000 [IU] via INTRAVENOUS
  Administered 2021-12-10 (×3): 2000 [IU] via INTRAVENOUS

## 2021-12-10 MED ORDER — SODIUM CHLORIDE 0.9% FLUSH: 3.0000 mL | INTRAVENOUS | Status: DC | PRN

## 2021-12-10 MED ORDER — CEFDINIR 300 MG PO CAPS
300.0000 mg | ORAL_CAPSULE | Freq: Every day | ORAL | Status: AC
  Administered 2021-12-10 – 2021-12-13 (×4): 300 mg via ORAL
  Filled 2021-12-10 (×4): qty 1

## 2021-12-10 MED ORDER — NITROGLYCERIN 1 MG/10 ML FOR IR/CATH LAB
INTRA_ARTERIAL | Status: AC
  Filled 2021-12-10: qty 10

## 2021-12-10 MED ORDER — HEPARIN SODIUM (PORCINE) 5000 UNIT/ML IJ SOLN
5000.0000 [IU] | Freq: Three times a day (TID) | INTRAMUSCULAR | Status: DC
  Administered 2021-12-10 – 2021-12-13 (×7): 5000 [IU] via SUBCUTANEOUS
  Filled 2021-12-10 (×8): qty 1

## 2021-12-10 MED ORDER — PANTOPRAZOLE SODIUM 40 MG PO TBEC
40.0000 mg | DELAYED_RELEASE_TABLET | Freq: Every day | ORAL | Status: DC
  Administered 2021-12-10 – 2021-12-13 (×4): 40 mg via ORAL
  Filled 2021-12-10 (×4): qty 1

## 2021-12-10 MED ORDER — CLOPIDOGREL BISULFATE 75 MG PO TABS
600.0000 mg | ORAL_TABLET | Freq: Once | ORAL | Status: DC
  Filled 2021-12-10: qty 8

## 2021-12-10 MED ORDER — SODIUM CHLORIDE 0.9% FLUSH
3.0000 mL | Freq: Two times a day (BID) | INTRAVENOUS | Status: DC
  Administered 2021-12-10 – 2021-12-12 (×6): 3 mL via INTRAVENOUS

## 2021-12-10 MED ORDER — IOHEXOL 350 MG/ML SOLN
INTRAVENOUS | Status: DC | PRN
  Administered 2021-12-10: 50 mL

## 2021-12-10 MED ORDER — VERAPAMIL HCL 2.5 MG/ML IV SOLN
INTRAVENOUS | Status: DC | PRN
  Administered 2021-12-10 (×2): 10 mL via INTRA_ARTERIAL

## 2021-12-10 MED ORDER — VERAPAMIL HCL 2.5 MG/ML IV SOLN
INTRAVENOUS | Status: AC
  Filled 2021-12-10: qty 2

## 2021-12-10 MED ORDER — LIDOCAINE HCL (PF) 1 % IJ SOLN
INTRAMUSCULAR | Status: AC
  Filled 2021-12-10: qty 30

## 2021-12-10 SURGICAL SUPPLY — 24 items
BALL SAPPHIRE NC24 4.0X26 (BALLOONS) ×1
BALLN SAPPHIRE 3.0X12 (BALLOONS) ×1
BALLN SCOREFLEX 3.50X10 (BALLOONS) ×1
BALLOON SAPPHIRE 3.0X12 (BALLOONS) IMPLANT
BALLOON SAPPHIRE NC24 4.0X26 (BALLOONS) IMPLANT
BALLOON SCOREFLEX 3.50X10 (BALLOONS) IMPLANT
CATH LAUNCHER 6FR JR4 (CATHETERS) IMPLANT
CATH OPTICROSS HD (CATHETERS) IMPLANT
DEVICE RAD COMP TR BAND LRG (VASCULAR PRODUCTS) IMPLANT
ELECT DEFIB PAD ADLT CADENCE (PAD) IMPLANT
GLIDESHEATH SLEND SS 6F .021 (SHEATH) IMPLANT
GUIDEWIRE INQWIRE 1.5J.035X260 (WIRE) IMPLANT
INQWIRE 1.5J .035X260CM (WIRE) ×1
KIT ENCORE 26 ADVANTAGE (KITS) IMPLANT
KIT HEART LEFT (KITS) ×1 IMPLANT
PACK CARDIAC CATHETERIZATION (CUSTOM PROCEDURE TRAY) ×1 IMPLANT
SHEATH PROBE COVER 6X72 (BAG) IMPLANT
SLED PULL BACK IVUS (MISCELLANEOUS) IMPLANT
STENT SYNERGY XD 3.50X32 (Permanent Stent) IMPLANT
SYNERGY XD 3.50X32 (Permanent Stent) ×1 IMPLANT
TRANSDUCER W/STOPCOCK (MISCELLANEOUS) ×1 IMPLANT
TUBING CIL FLEX 10 FLL-RA (TUBING) ×1 IMPLANT
WIRE ASAHI PROWATER 180CM (WIRE) IMPLANT
WIRE RUNTHROUGH .014X180CM (WIRE) IMPLANT

## 2021-12-10 NOTE — Progress Notes (Signed)
Pt refused to stand for daily weight. Bed weight charted per Sharyn Lull NT

## 2021-12-10 NOTE — Progress Notes (Signed)
   Heart Failure Stewardship Pharmacist Progress Note   PCP: Dettinger, Fransisca Kaufmann, MD PCP-Cardiologist: None    HPI:  71 yo M with PMH of CKD IIIb, T2DM, anemia, CHF, and HTN.   He presented to the ED on 11/30 with shortness of breath. CXR with R bibasilar PNA. ECHO 12/1 with LVEF 35-40%, akinesis of the inferolateral wall, regional wall motion abnormalities, G3DD, RV normal, small pericardial effusion. Taken for [facility] on 12/1 and found to have multivessel CAD with patent prior stents, but new lesion in RCA. Post cath chest discomfort. Staged PCI to RCA done on 12/4.  Current HF Medications: Beta Blocker: carvedilol 25 mg BID  Prior to admission HF Medications: Beta blocker: carvedilol 25 mg BID  Pertinent Lab Values: Serum creatinine 2.93, BUN 53, Potassium 4.1, Sodium 138, BNP 509.9, Magnesium 2.0, A1c 6.8  Vital Signs: Weight: 189 lbs (admission weight: 194 lbs) Blood pressure: 160/70s  Heart rate: 70s  I/O: -0.7L yesterday; net -2.9L  Medication Assistance / Insurance Benefits Check: Does the patient have prescription insurance?  Yes Type of insurance plan: [employer] Medicare  Outpatient Pharmacy:  Prior to admission outpatient pharmacy: [facility] Is the patient willing to use [place] at discharge? Yes Is the patient willing to transition their outpatient pharmacy to utilize a [facility] outpatient pharmacy?   Pending    Assessment: 1. Chronic systolic CHF (LVEF 83-09%), due to ICM. NYHA class II symptoms. - Not volume overloaded on exam, no indications for diuretics - Continue carvedilol 25 mg BID - No ACE/ARB/ARNI, MRA, or SGLT2i with advanced CKD. eGFR 22. - He takes tadalafil regularly at home. Caution nitrates. May need to add hydralazine for HTN.   Plan: 1) Medication changes recommended at this time: - Add hydralazine 25 mg TID  2) Patient assistance: - None pending  3)  Education  - To be completed prior to discharge  Kerby Nora, PharmD,  BCPS Heart Failure Stewardship Pharmacist Phone [phone]

## 2021-12-10 NOTE — Interval H&P Note (Signed)
History and Physical Interval Note:  12/10/2021 8:59 AM  Austin Weber  has presented today for surgery, with the diagnosis of NSTEMI.  The various methods of treatment have been discussed with the patient and family. After consideration of risks, benefits and other options for treatment, the patient has consented to  Procedure(s): CORONARY STENT INTERVENTION (N/A) as a surgical intervention.  The patient's history has been reviewed, patient examined, no change in status, stable for surgery.  I have reviewed the patient's chart and labs.  Questions were answered to the patient's satisfaction.    Cath Lab Visit (complete for each Cath Lab visit)  Clinical Evaluation Leading to the Procedure:   ACS: Yes.    Non-ACS:  N/A  [name]

## 2021-12-10 NOTE — Progress Notes (Signed)
Rounding Note    Patient Name: Austin Weber Date of Encounter: 12/10/2021  [place] Cardiologist: None   Subjective   No chest pain overnight.  No dyspnea.  Inpatient Medications    Scheduled Meds:  amLODipine  10 mg Oral Daily   aspirin  81 mg Oral Daily   atorvastatin  80 mg Oral Daily   azithromycin  500 mg Oral Daily   carvedilol  25 mg Oral BID WC   clopidogrel  600 mg Oral Once   Followed by   [START ON 12/11/2021] clopidogrel  75 mg Oral Daily   pantoprazole  40 mg Oral Daily   sodium chloride flush  3 mL Intravenous Q12H   sodium chloride flush  3 mL Intravenous Q12H   sodium chloride flush  3 mL Intravenous Q12H   Continuous Infusions:  sodium chloride     sodium chloride     cefTRIAXone (ROCEPHIN)  IV 2 g (12/09/21 0838)   heparin 1,400 Units/hr (12/10/21 0505)   sodium bicarbonate 150 mEq in dextrose 5 % 1,150 mL infusion 50 mL/hr at 12/10/21 0313   PRN Meds: sodium chloride, sodium chloride, HYDROmorphone (DILAUDID) injection, ipratropium-albuterol, oxyCODONE, sodium chloride flush, sodium chloride flush   Vital Signs    Vitals:   12/09/21 1620 12/09/21 2004 12/10/21 0250 12/10/21 0453  BP: (!) 147/74 (!) 166/78 (!) 158/71   Pulse: 84     Resp: 18 17    Temp: 97.8 F (36.6 C) 98.1 F (36.7 C) 98.6 F (37 C)   TempSrc: Oral Oral Oral   SpO2: 97% 95% 98%   Weight:    86 kg  Height:        Intake/Output Summary (Last 24 hours) at 12/10/2021 0752 Last data filed at 12/10/2021 0313 Gross per 24 hour  Intake 1188.45 ml  Output 300 ml  Net 888.45 ml      12/10/2021    4:53 AM 12/09/2021    5:10 AM 12/08/2021    6:14 AM  Last 3 Weights  Weight (lbs) 189 lb 11.2 oz 189 lb 9.5 oz 194 lb 9.6 oz  Weight (kg) 86.047 kg 86 kg 88.27 kg      Telemetry    Normal sinus rhythm, sinus tachycardia - Personally Reviewed   Physical Exam  Alert, oriented, no distress GEN: No acute distress.   Neck: No JVD Cardiac: RRR, no murmurs,  rubs, or gallops.  Respiratory: Clear to auscultation bilaterally. GI: Soft, nontender, non-distended  MS: No edema; No deformity.  Right radial cath site is tender but no hematoma or ecchymosis is present. Neuro:  Nonfocal  Psych: Normal affect   Labs    High Sensitivity Troponin:   Recent Labs  Lab 12/06/21 2145 12/07/21 0040 12/07/21 0201 12/07/21 0851 12/08/21 0846  TROPONINIHS 373* 1,730* 3,199* 4,829* 4,110*     Chemistry Recent Labs  Lab 12/07/21 0040 12/08/21 0326 12/09/21 0122 12/10/21 0246  NA 140 140 140 138  K 4.5 4.2 4.1 4.1  CL 108 113* 112* 111  CO2 17* 18* 18* 18*  GLUCOSE 289* 117* 110* 148*  BUN 63* 48* 52* 53*  CREATININE 3.72* 3.09* 3.25* 2.93*  CALCIUM 9.0 9.2 9.0 8.5*  MG 2.0  --   --   --   PROT 5.5*  --   --   --   ALBUMIN 2.8*  --   --   --   AST 23  --   --   --   ALT  20  --   --   --   ALKPHOS 57  --   --   --   BILITOT 0.2*  --   --   --   GFRNONAA 16* 21* 19* 22*  ANIONGAP 15 9 10 9     Lipids  Recent Labs  Lab 12/07/21 0851  CHOL 134  TRIG 52  HDL 43  LDLCALC 81  CHOLHDL 3.1    Hematology Recent Labs  Lab 12/08/21 0326 12/09/21 0122 12/10/21 0246  WBC 8.3 8.5 8.5  RBC 3.17* 3.24* 3.10*  HGB 10.0* 10.2* 9.7*  HCT 30.1* 30.8* 28.7*  MCV 95.0 95.1 92.6  MCH 31.5 31.5 31.3  MCHC 33.2 33.1 33.8  RDW 12.4 12.2 12.2  PLT 139* 147* 138*   Thyroid  Recent Labs  Lab 12/07/21 0851  TSH 2.371    BNP Recent Labs  Lab 12/06/21 1950  BNP 509.9*    DDimer No results for input(s): "DDIMER" in the last 168 hours.   Radiology    No results found.  Cardiac Studies   Cardiac catheterization films were personally reviewed  2D echocardiogram:  1. Akinesis of the inferolateral wall with moderate LV dysfunction; small  pericardial effusion with mild RV diastolic collapse; however IVC not  dilated and collapses with inspiration.   2. Left ventricular ejection fraction, by estimation, is 35 to 40%. The  left ventricle  has moderately decreased function. The left ventricle  demonstrates regional wall motion abnormalities (see scoring  diagram/findings for description). There is  moderate concentric left ventricular hypertrophy. Left ventricular  diastolic parameters are consistent with Grade III diastolic dysfunction  (restrictive).   3. Right ventricular systolic function is normal. The right ventricular  size is normal.   4. A small pericardial effusion is present.   5. The mitral valve is normal in structure. No evidence of mitral valve  regurgitation. No evidence of mitral stenosis. Moderate mitral annular  calcification.   6. The aortic valve was not well visualized. Aortic valve regurgitation  is not visualized. No aortic stenosis is present.   7. The inferior vena cava is normal in size with greater than 50%  respiratory variability, suggesting right atrial pressure of 3 mmHg.   Comparison(s): No prior Echocardiogram.   Patient Profile     71 y.o. male with coronary artery disease prior MI in 1999 with stenting RCA and staged DES to LAD with ischemic cardiomyopathy prior EF 30 to 35% in 2012 poorly controlled hypertension hyperlipidemia CKD stage IV, Dr. Theador Hawthorne, with heavy proteinuria previously declining biopsy, diabetes, tobacco use, new left bundle branch block elevated troponin with chest pain this admission.   Assessment & Plan    1.  Non-STEMI: Patient with recurrent chest pain in the hospital.  Clinical notes, lab data, and cardiac catheterization films are personally reviewed.  The patient has severe ostial and proximal stenosis of the large, dominant RCA.  I agree that Austin Weber other coronary artery disease including total occlusion of an OM branch with late filling by collaterals should be managed medically.  However, the RCA is appropriate for PCI.  I think the patient is at high risk of recurrent MI if managed medically.  Reviewed this with him today.  We discussed this in the context of Austin Weber  chronic kidney disease and risk for acute kidney injury/renal failure.  During this hospitalization Austin Weber creatinine has trended down from 4.13-2.93 today.  He has been treated with IV fluids overnight including sodium bicarbonate.  We will  load him with clopidogrel 600 mg now.  He understands the risks of kidney failure requiring dialysis are increased significantly compared with the normal population.  He would be willing to at least have short-term hemodialysis if needed.  He is adamantly against long-term hemodialysis but understands the risks involved.  After shared decision-making conversation, he provides consent for PCI of the right coronary artery. I have reviewed the risks, indications, and alternatives to cardiac catheterization, possible angioplasty, and stenting with the patient. Risks include but are not limited to bleeding, infection, vascular injury, stroke, myocardial infection, arrhythmia, kidney injury, radiation-related injury in the case of prolonged fluoroscopy use, emergency cardiac surgery, and death. The patient understands the risks of serious complication is 1-2 in 4473 with diagnostic cardiac cath and 1-2% or less with angioplasty/stenting.    2.  Heart failure with reduced ejection fraction: LVEF 35 to 40% on carvedilol.  Not a candidate for ACE/ARB/Entresto/Spyro due to advanced kidney disease.  Appears euvolemic on exam today.  Consider addition of Imdur/hydralazine.  Will wait until tomorrow since he is undergoing PCI today.  Other problems appear stable and managed by primary team.  We will follow-up post PCI.       For questions or updates, please contact [place] Please consult [email or web address] for contact info under        Signed, Sherren Mocha, MD  12/10/2021, 7:52 AM

## 2021-12-10 NOTE — Progress Notes (Signed)
Bipap not indicated at this time. 

## 2021-12-10 NOTE — Progress Notes (Signed)
ANTICOAGULATION CONSULT NOTE - Follow Up Consult  Pharmacy Consult for Heparin Indication: chest pain/ACS  Allergies  Allergen Reactions   Nitroglycerin     NOT an allergy - just FYI patient takes tadalafil regularly so please review last dose    Patient Measurements: Height: 6\' 1"  (185.4 cm) Weight: 86 kg (189 lb 11.2 oz) IBW/kg (Calculated) : 79.9 Heparin Dosing Weight: 86 kg  Vital Signs: Temp: 98.6 F (37 C) (12/04 0250) Temp Source: Oral (12/04 0250) BP: 158/71 (12/04 0250)  Labs: Recent Labs    12/07/21 0851 12/08/21 0326 12/08/21 0846 12/08/21 1651 12/09/21 0122 12/09/21 1050 12/10/21 0246  HGB 12.0* 10.0*  --   --  10.2*  --  9.7*  HCT 35.8* 30.1*  --   --  30.8*  --  28.7*  PLT 161 139*  --   --  147*  --  138*  HEPARINUNFRC  --  0.32  --    < > 0.26* 0.36 0.40  CREATININE  --  3.09*  --   --  3.25*  --  2.93*  TROPONINIHS 4,829*  --  4,110*  --   --   --   --    < > = values in this interval not displayed.     Estimated Creatinine Clearance: 25.4 mL/min (A) (by C-G formula based on SCr of 2.93 mg/dL (H)).  Assessment: 26 YOM s/p cath with consideration for staged PCI of ostial RCA and plan to treat the circumflex medically. Pharmacy consulted to resume IV heparin 2 hours post radial band removal on 12/07/21. Radial band removed around 1700 per discussion with RN; no bleeding nor hematoma observed.    Heparin level is therapeutic at 0.4, on 1400 units/hr. Hgb stable at 9.7, plt 138. No s/sx of bleeding or infusion issues.    Goal of Therapy:  Heparin level 0.3-0.7 units/ml Monitor platelets by anticoagulation protocol: Yes   Plan:  Continue heparin drip at 1400 units/hr Daily heparin level and CBC while on heparin. Anticipating intervention in cath lab on 12/4.  Antonietta Jewel, PharmD, [place] Clinical Pharmacist  Phone: [phone] 12/10/2021 7:27 AM  Please check AMION for all [place] phone numbers After 10:00 PM, call [place]  [phone]

## 2021-12-10 NOTE — Care Management (Signed)
  Transition of Care [facility]) Screening Note   Patient Details  Name: Austin Weber Date of Birth: [birth year]   Transition of Care [facility]) CM/SW Contact:    Bethena Roys, RN Phone Number: 12/10/2021, 3:16 PM    Transition of Care Department [facility]) has reviewed the patient and no TOC needs have been identified at this time. Patient post PCI- Plavix. Case Manager will continue to monitor patient advancement through interdisciplinary progression rounds. If new patient transition needs arise, please place a TOC consult.

## 2021-12-10 NOTE — Progress Notes (Addendum)
Progress Note   Patient: Austin Weber EZM:[id number] DOB: 09/15/1950 DOA: 12/06/2021     4 DOS: the patient was seen and examined on 12/10/2021   Brief hospital course: Mr. Morissette was admitted to the hospital with the working diagnosis of community acquired pneumonia, complicated with respiratory failure.   71 yo male with the past medical history of CKD stage 3b, T2DM, heart failure and hypertension who presented with dyspnea. Patient at home developed severe dyspnea, EMS was called and he was found in respiratory distress with 02 saturation in the 70's. Patient was transported the ED and was placed on non invasive mechanical ventilation. His blood pressure in the ED 148/77. HR 104, RR 22 and 02 saturation 98% on bipap. Lungs with rales but not wheezing. Heart with S1 and S2 present and tachycardic with no gallops, abdomen with no distention, no lower extremity edema.   ABG 7,24/ 45/ 207/ 21/ 100% Na 138, K 4,6 Cl 110, bicarbonate 18, glucose 353 BUN 62, CR 4.13 BNP 509  High sensitive troponin 36, 373, 1,730, 3,199, 4,829  Wbc 14,5 hgb 12.0 plt 198  Sars covid 19 negative   Chest radiograph with cardiomegaly with dense right lower lobe patchy infiltrate with positive air bronchogram.   EKG 131 bpm, normal axis, left bundle branch block, SVT with no significant ST segment or T wave changes   12/01 Patient placed on broad spectrum antibiotic therapy and heparin drip. Cardiac catheterization with diffuse coronary artery disease, possible PCI to ostial RCA during this hospitalization at a later time when renal function more stable.  12/03 no chest pain and responding well to antibiotic therapy, plan for coronary angiography and PCI tomorrow.  12/04 PCI successful to proximal RCA. Transition to oral antibiotic therapy.   Assessment and Plan: * CAP (community acquired pneumonia) Patient with dense right lower lobe infiltrate (present on admission). Acute hypoxemic and hypercapnic  respiratory failure.  Severe sepsis present on admission.   Patient required Bipap on admission, now he has been transitioned to supplemental 02 per [place]. 02 saturation is 93 % on room air.   Transition to oral cefdinir (total 8 days) and continue with azithromycin (total 5 days)  As needed bronchodilator therapy Airway clearing techniques with flutter valve and incentive spirometer.  Out of bed to chair tid with meals, PT and OT.   Non-ST elevation (NSTEMI) myocardial infarction [facility]) EKG with new left bundle branch block, echocardiogram with positive well motion abnormalities.   Successful PCI to ostial proximal RCA.  Discontinue heparin drip Dual antiplatelet therapy with clopidogrel and aspirin Continue with statin therapy  B blockade with carvedilol 25 mg bid.  Pain control with hydromorphone and oxycodone. (No further chest pain).   Acute systolic heart failure (HCC) Echocardiogram with reduced LV systolic function 35 to 50%, moderate LVH, RV systolic function preserved. Small pericardial effusion with mild RV diastolic collapse. No significant valvular disease. Akinesis of the inferolateral wall with moderate LV dysfunction.  No clinical signs of volume overload Left ventricle end diastolic pressure 30 mmHg.  Continue with carvedilol, further RAS inhibition when renal function more stable. Hold on loop diuretic therapy for now.   Acute kidney injury superimposed on chronic kidney disease ([place]) CKD stage 3 b (base cr at 24 old records personally reviewed).   Pre- catheterization PCI, had bicarb drip at 50 ml per hr with good toleration.  Follow up renal function today with serum cr at 2.93 with K at 4,1 and serum bicarbonate at 18. CL 111.  Plan to continue close follow up on renal function and electrolytes.   Avoid hypotension or nephrotoxic medications. Hold on diuretic therapy for now.   Hypertension associated with diabetes ([place]) Systolic blood pressure 384 to 160  mmHg.  At home patient on losartan.  Continue with amlodipine and carvedilol.   Type 2 diabetes mellitus with hyperlipidemia (HCC) Glucose has been stable, and insulin therapy has been discontinued.  His fasting glucose this am is 148 mg/dl.  Continue with statin therapy.   Cigarette nicotine dependence without complication Smoking cessation counseling.         Subjective: Patient with no chest pain or dyspnea,  no edema, nausea or vomiting, no significant cough   Physical Exam: Vitals:   12/10/21 1012 12/10/21 1017 12/10/21 1021 12/10/21 1055  BP: 139/68 (!) 140/72  (!) 146/54  Pulse: 78 79 81 71  Resp: 19 17  18   Temp:    (!) 97.5 F (36.4 C)  TempSrc:    Oral  SpO2: 93% 95% 93%   Weight:      Height:       Neurology awake and alert ENT with mild pallor Cardiovascular with S1 and S2 present and rhythmic with no gallops, rubs or murmurs Respiratory with mild rales at the right base with no rhonchi or wheezing Abdomen with no distention  No lower extremity edema.  Data Reviewed:    Family Communication: I spoke with patient's son at the bedside, we talked in detail about patient's condition, plan of care and prognosis and all questions were addressed.   Disposition: Status is: Inpatient Remains inpatient appropriate because: acute coronary syndrome   Planned Discharge Destination: Home    Author: Tawni Millers, MD 12/10/2021 1:50 PM  For on call review [email or web address].

## 2021-12-10 NOTE — H&P (View-Only) (Signed)
Rounding Note    Patient Name: Austin Weber Date of Encounter: 12/10/2021  [place] Cardiologist: None   Subjective   No chest pain overnight.  No dyspnea.  Inpatient Medications    Scheduled Meds:  amLODipine  10 mg Oral Daily   aspirin  81 mg Oral Daily   atorvastatin  80 mg Oral Daily   azithromycin  500 mg Oral Daily   carvedilol  25 mg Oral BID WC   clopidogrel  600 mg Oral Once   Followed by   [START ON 12/11/2021] clopidogrel  75 mg Oral Daily   pantoprazole  40 mg Oral Daily   sodium chloride flush  3 mL Intravenous Q12H   sodium chloride flush  3 mL Intravenous Q12H   sodium chloride flush  3 mL Intravenous Q12H   Continuous Infusions:  sodium chloride     sodium chloride     cefTRIAXone (ROCEPHIN)  IV 2 g (12/09/21 0838)   heparin 1,400 Units/hr (12/10/21 0505)   sodium bicarbonate 150 mEq in dextrose 5 % 1,150 mL infusion 50 mL/hr at 12/10/21 0313   PRN Meds: sodium chloride, sodium chloride, HYDROmorphone (DILAUDID) injection, ipratropium-albuterol, oxyCODONE, sodium chloride flush, sodium chloride flush   Vital Signs    Vitals:   12/09/21 1620 12/09/21 2004 12/10/21 0250 12/10/21 0453  BP: (!) 147/74 (!) 166/78 (!) 158/71   Pulse: 84     Resp: 18 17    Temp: 97.8 F (36.6 C) 98.1 F (36.7 C) 98.6 F (37 C)   TempSrc: Oral Oral Oral   SpO2: 97% 95% 98%   Weight:    86 kg  Height:        Intake/Output Summary (Last 24 hours) at 12/10/2021 0752 Last data filed at 12/10/2021 0313 Gross per 24 hour  Intake 1188.45 ml  Output 300 ml  Net 888.45 ml      12/10/2021    4:53 AM 12/09/2021    5:10 AM 12/08/2021    6:14 AM  Last 3 Weights  Weight (lbs) 189 lb 11.2 oz 189 lb 9.5 oz 194 lb 9.6 oz  Weight (kg) 86.047 kg 86 kg 88.27 kg      Telemetry    Normal sinus rhythm, sinus tachycardia - Personally Reviewed   Physical Exam  Alert, oriented, no distress GEN: No acute distress.   Neck: No JVD Cardiac: RRR, no murmurs,  rubs, or gallops.  Respiratory: Clear to auscultation bilaterally. GI: Soft, nontender, non-distended  MS: No edema; No deformity.  Right radial cath site is tender but no hematoma or ecchymosis is present. Neuro:  Nonfocal  Psych: Normal affect   Labs    High Sensitivity Troponin:   Recent Labs  Lab 12/06/21 2145 12/07/21 0040 12/07/21 0201 12/07/21 0851 12/08/21 0846  TROPONINIHS 373* 1,730* 3,199* 4,829* 4,110*     Chemistry Recent Labs  Lab 12/07/21 0040 12/08/21 0326 12/09/21 0122 12/10/21 0246  NA 140 140 140 138  K 4.5 4.2 4.1 4.1  CL 108 113* 112* 111  CO2 17* 18* 18* 18*  GLUCOSE 289* 117* 110* 148*  BUN 63* 48* 52* 53*  CREATININE 3.72* 3.09* 3.25* 2.93*  CALCIUM 9.0 9.2 9.0 8.5*  MG 2.0  --   --   --   PROT 5.5*  --   --   --   ALBUMIN 2.8*  --   --   --   AST 23  --   --   --   ALT  20  --   --   --   ALKPHOS 57  --   --   --   BILITOT 0.2*  --   --   --   GFRNONAA 16* 21* 19* 22*  ANIONGAP 15 9 10 9     Lipids  Recent Labs  Lab 12/07/21 0851  CHOL 134  TRIG 52  HDL 43  LDLCALC 81  CHOLHDL 3.1    Hematology Recent Labs  Lab 12/08/21 0326 12/09/21 0122 12/10/21 0246  WBC 8.3 8.5 8.5  RBC 3.17* 3.24* 3.10*  HGB 10.0* 10.2* 9.7*  HCT 30.1* 30.8* 28.7*  MCV 95.0 95.1 92.6  MCH 31.5 31.5 31.3  MCHC 33.2 33.1 33.8  RDW 12.4 12.2 12.2  PLT 139* 147* 138*   Thyroid  Recent Labs  Lab 12/07/21 0851  TSH 2.371    BNP Recent Labs  Lab 12/06/21 1950  BNP 509.9*    DDimer No results for input(s): "DDIMER" in the last 168 hours.   Radiology    No results found.  Cardiac Studies   Cardiac catheterization films were personally reviewed  2D echocardiogram:  1. Akinesis of the inferolateral wall with moderate LV dysfunction; small  pericardial effusion with mild RV diastolic collapse; however IVC not  dilated and collapses with inspiration.   2. Left ventricular ejection fraction, by estimation, is 35 to 40%. The  left ventricle  has moderately decreased function. The left ventricle  demonstrates regional wall motion abnormalities (see scoring  diagram/findings for description). There is  moderate concentric left ventricular hypertrophy. Left ventricular  diastolic parameters are consistent with Grade III diastolic dysfunction  (restrictive).   3. Right ventricular systolic function is normal. The right ventricular  size is normal.   4. A small pericardial effusion is present.   5. The mitral valve is normal in structure. No evidence of mitral valve  regurgitation. No evidence of mitral stenosis. Moderate mitral annular  calcification.   6. The aortic valve was not well visualized. Aortic valve regurgitation  is not visualized. No aortic stenosis is present.   7. The inferior vena cava is normal in size with greater than 50%  respiratory variability, suggesting right atrial pressure of 3 mmHg.   Comparison(s): No prior Echocardiogram.   Patient Profile     71 y.o. male with coronary artery disease prior MI in 1999 with stenting RCA and staged DES to LAD with ischemic cardiomyopathy prior EF 30 to 35% in 2012 poorly controlled hypertension hyperlipidemia CKD stage IV, Dr. Theador Hawthorne, with heavy proteinuria previously declining biopsy, diabetes, tobacco use, new left bundle branch block elevated troponin with chest pain this admission.   Assessment & Plan    1.  Non-STEMI: Patient with recurrent chest pain in the hospital.  Clinical notes, lab data, and cardiac catheterization films are personally reviewed.  The patient has severe ostial and proximal stenosis of the large, dominant RCA.  I agree that his other coronary artery disease including total occlusion of an OM branch with late filling by collaterals should be managed medically.  However, the RCA is appropriate for PCI.  I think the patient is at high risk of recurrent MI if managed medically.  Reviewed this with him today.  We discussed this in the context of his  chronic kidney disease and risk for acute kidney injury/renal failure.  During this hospitalization his creatinine has trended down from 4.13-2.93 today.  He has been treated with IV fluids overnight including sodium bicarbonate.  We will  load him with clopidogrel 600 mg now.  He understands the risks of kidney failure requiring dialysis are increased significantly compared with the normal population.  He would be willing to at least have short-term hemodialysis if needed.  He is adamantly against long-term hemodialysis but understands the risks involved.  After shared decision-making conversation, he provides consent for PCI of the right coronary artery. I have reviewed the risks, indications, and alternatives to cardiac catheterization, possible angioplasty, and stenting with the patient. Risks include but are not limited to bleeding, infection, vascular injury, stroke, myocardial infection, arrhythmia, kidney injury, radiation-related injury in the case of prolonged fluoroscopy use, emergency cardiac surgery, and death. The patient understands the risks of serious complication is 1-2 in 1916 with diagnostic cardiac cath and 1-2% or less with angioplasty/stenting.    2.  Heart failure with reduced ejection fraction: LVEF 35 to 40% on carvedilol.  Not a candidate for ACE/ARB/Entresto/Spyro due to advanced kidney disease.  Appears euvolemic on exam today.  Consider addition of Imdur/hydralazine.  Will wait until tomorrow since he is undergoing PCI today.  Other problems appear stable and managed by primary team.  We will follow-up post PCI.       For questions or updates, please contact [place] Please consult [email or web address] for contact info under        Signed, Sherren Mocha, MD  12/10/2021, 7:52 AM

## 2021-12-11 LAB — BASIC METABOLIC PANEL WITH GFR
Anion gap: 8 (ref 5–15)
BUN: 48 mg/dL — ABNORMAL HIGH (ref 8–23)
CO2: 19 mmol/L — ABNORMAL LOW (ref 22–32)
Calcium: 8.6 mg/dL — ABNORMAL LOW (ref 8.9–10.3)
Chloride: 110 mmol/L (ref 98–111)
Creatinine, Ser: 3.18 mg/dL — ABNORMAL HIGH (ref 0.61–1.24)
GFR, Estimated: 20 mL/min — ABNORMAL LOW
Glucose, Bld: 171 mg/dL — ABNORMAL HIGH (ref 70–99)
Potassium: 4.1 mmol/L (ref 3.5–5.1)
Sodium: 137 mmol/L (ref 135–145)

## 2021-12-11 LAB — CBC
HCT: 27.3 % — ABNORMAL LOW (ref 39.0–52.0)
Hemoglobin: 9.5 g/dL — ABNORMAL LOW (ref 13.0–17.0)
MCH: 32.3 pg (ref 26.0–34.0)
MCHC: 34.8 g/dL (ref 30.0–36.0)
MCV: 92.9 fL (ref 80.0–100.0)
Platelets: 147 10*3/uL — ABNORMAL LOW (ref 150–400)
RBC: 2.94 MIL/uL — ABNORMAL LOW (ref 4.22–5.81)
RDW: 12.1 % (ref 11.5–15.5)
WBC: 7.5 10*3/uL (ref 4.0–10.5)
nRBC: 0 % (ref 0.0–0.2)

## 2021-12-11 MED ORDER — HYDRALAZINE HCL 25 MG PO TABS
25.0000 mg | ORAL_TABLET | Freq: Two times a day (BID) | ORAL | Status: DC
  Administered 2021-12-11 – 2021-12-12 (×3): 25 mg via ORAL
  Filled 2021-12-11 (×3): qty 1

## 2021-12-11 NOTE — Progress Notes (Addendum)
Progress Note   Patient: Austin Weber ZOX:[id number] DOB: 17-Dec-1950 DOA: 12/06/2021     5 DOS: the patient was seen and examined on 12/11/2021   Brief hospital course: Mr. Gentles was admitted to the hospital with the working diagnosis of community acquired pneumonia, complicated with respiratory failure.   71 yo male with the past medical history of CKD stage 3b, T2DM, heart failure and hypertension who presented with dyspnea. Patient at home developed severe dyspnea, EMS was called and he was found in respiratory distress with 02 saturation in the 70's. Patient was transported the ED and was placed on non invasive mechanical ventilation. His blood pressure in the ED 148/77. HR 104, RR 22 and 02 saturation 98% on bipap. Lungs with rales but not wheezing. Heart with S1 and S2 present and tachycardic with no gallops, abdomen with no distention, no lower extremity edema.   ABG 7,24/ 45/ 207/ 21/ 100% Na 138, K 4,6 Cl 110, bicarbonate 18, glucose 353 BUN 62, CR 4.13 BNP 509  High sensitive troponin 36, 373, 1,730, 3,199, 4,829  Wbc 14,5 hgb 12.0 plt 198  Sars covid 19 negative   Chest radiograph with cardiomegaly with dense right lower lobe patchy infiltrate with positive air bronchogram.   EKG 131 bpm, normal axis, left bundle branch block, SVT with no significant ST segment or T wave changes   Successfully liberated from Bipap and transitioned to nasal cannula.   12/01 Patient placed on broad spectrum antibiotic therapy and heparin drip. Cardiac catheterization with diffuse coronary artery disease, possible PCI to ostial RCA during this hospitalization at a later time when renal function more stable.  12/03 no chest pain and responding well to antibiotic therapy, plan for coronary angiography and PCI tomorrow.  12/04 PCI successful to proximal RCA. Transition to oral antibiotic therapy.  12/05 hemodynamically stable, follow up renal function in am, possible dc in 24 hrs.   Assessment  and Plan: * CAP (community acquired pneumonia) Patient with dense right lower lobe infiltrate (present on admission). Acute hypoxemic and hypercapnic respiratory failure.  Severe sepsis present on admission.   Patient required Bipap on admission, now he has been transitioned to supplemental 02 per [place]. 02 saturation is 95 % on room air.   Transition to oral cefdinir (total 8 days) and continue with azithromycin (total 5 days)  As needed bronchodilator therapy Airway clearing techniques with flutter valve and incentive spirometer.  Out of bed to chair tid with meals, PT and OT.   Non-ST elevation (NSTEMI) myocardial infarction [facility] EKG with new left bundle branch block, echocardiogram with positive well motion abnormalities.   Successful PCI to ostial proximal RCA.   Continue with dual antiplatelet therapy with clopidogrel and aspirin Continue with statin therapy  B blockade with carvedilol 25 mg bid.  Pain control with hydromorphone and oxycodone. (No further chest pain).   Acute systolic heart failure (HCC) Echocardiogram with reduced LV systolic function 35 to 81%, moderate LVH, RV systolic function preserved. Small pericardial effusion with mild RV diastolic collapse. No significant valvular disease. Akinesis of the inferolateral wall with moderate LV dysfunction.  No clinical signs of volume overload Left ventricle end diastolic pressure 30 mmHg.  Continue with carvedilol, further RAS inhibition when renal function more stable. Hold on loop diuretic therapy for now.   Acute kidney injury superimposed on chronic kidney disease ([place]) CKD stage 3 b (base cr at 80 old records personally reviewed).  Patient received gently IV fluids (bicarb drip for contrast induced nephropathy  prophylaxis).   Follow up renal function with serum cr at 3.18 with K at 4,1 and serum bicarbonate at 19. Plan to continue close follow up renal function and electrolytes.  Avoid hypotension or nephrotoxic  medications.  Holding diuretic therapy for now.   Essential hypertension Systolic blood pressure 785 to 170 mmHg.  At home patient on losartan.  Continue with amlodipine and carvedilol. Added hydralazine 25 mg bid.   Type 2 diabetes mellitus with hyperlipidemia (HCC) Glucose has been stable, and insulin therapy has been discontinued.  His fasting glucose this am is 171 mg/dl.  Continue with statin therapy.   Cigarette nicotine dependence without complication Smoking cessation counseling.         Subjective: Patient with no chest pain or dyspnea, no cough   Physical Exam: Vitals:   12/11/21 0509 12/11/21 0650 12/11/21 0844 12/11/21 1017  BP: (!) 163/72  (!) 172/68   Pulse: 88  93   Resp:   16   Temp: (!) 97.4 F (36.3 C)     TempSrc: Oral     SpO2: 91%  94% 95%  Weight:  85.9 kg    Height:       Neurology awake and alert ENT with mild pallor Cardiovascular with S1 and S2 present and rhythmic with no gallops, positive murmur at the apex No JVD No lower extremity edema Respiratory with mild rales at bases with no wheezing or rhonchi Abdomen with no distention  Data Reviewed:    Family Communication: no family at the bedside   Disposition: Status is: Inpatient Remains inpatient appropriate because: acute coronary syndrome, follow up renal function in am.   Planned Discharge Destination: Home     Author: Tawni Millers, MD 12/11/2021 3:20 PM  For on call review [email or web address].

## 2021-12-11 NOTE — Progress Notes (Addendum)
Rounding Note    Patient Name: Austin Weber Date of Encounter: 12/11/2021  [place] Cardiologist: new -seen by Dr. Gwenlyn Found, may need to be set up with   Subjective   Denies any CP or SOB.   Inpatient Medications    Scheduled Meds:  amLODipine  10 mg Oral Daily   aspirin  81 mg Oral Daily   atorvastatin  80 mg Oral Daily   carvedilol  25 mg Oral BID WC   cefdinir  300 mg Oral Daily   clopidogrel  600 mg Oral Once   Followed by   clopidogrel  75 mg Oral Daily   heparin  5,000 Units Subcutaneous Q8H   pantoprazole  40 mg Oral Daily   sodium chloride flush  3 mL Intravenous Q12H   sodium chloride flush  3 mL Intravenous Q12H   Continuous Infusions:  sodium chloride     PRN Meds: sodium chloride, HYDROmorphone (DILAUDID) injection, ipratropium-albuterol, oxyCODONE, sodium chloride flush   Vital Signs    Vitals:   12/10/21 1750 12/10/21 2148 12/11/21 0509 12/11/21 0650  BP: (!) 147/66  (!) 163/72   Pulse: 91 81 88   Resp:  18    Temp:  97.7 F (36.5 C) (!) 97.4 F (36.3 C)   TempSrc:  Oral Oral   SpO2:   91%   Weight:    85.9 kg  Height:        Intake/Output Summary (Last 24 hours) at 12/11/2021 0818 Last data filed at 12/10/2021 1915 Gross per 24 hour  Intake --  Output 500 ml  Net -500 ml      12/11/2021    6:50 AM 12/10/2021    4:53 AM 12/09/2021    5:10 AM  Last 3 Weights  Weight (lbs) 189 lb 6 oz 189 lb 11.2 oz 189 lb 9.5 oz  Weight (kg) 85.9 kg 86.047 kg 86 kg      Telemetry    NSR without significant ventricular ectopy - Personally Reviewed  ECG    NSR with LBBB - Personally Reviewed  Physical Exam   GEN: No acute distress.   Neck: No JVD Cardiac: RRR, no murmurs, rubs, or gallops.  Respiratory: Clear to auscultation bilaterally. Mild bibasilar crackles GI: Soft, nontender, non-distended  MS: No edema; No deformity. Neuro:  Nonfocal  Psych: Normal affect   Labs    High Sensitivity Troponin:   Recent Labs   Lab 12/06/21 2145 12/07/21 0040 12/07/21 0201 12/07/21 0851 12/08/21 0846  TROPONINIHS 373* 1,730* 3,199* 4,829* 4,110*     Chemistry Recent Labs  Lab 12/07/21 0040 12/08/21 0326 12/09/21 0122 12/10/21 0246 12/11/21 0328  NA 140   < > 140 138 137  K 4.5   < > 4.1 4.1 4.1  CL 108   < > 112* 111 110  CO2 17*   < > 18* 18* 19*  GLUCOSE 289*   < > 110* 148* 171*  BUN 63*   < > 52* 53* 48*  CREATININE 3.72*   < > 3.25* 2.93* 3.18*  CALCIUM 9.0   < > 9.0 8.5* 8.6*  MG 2.0  --   --   --   --   PROT 5.5*  --   --   --   --   ALBUMIN 2.8*  --   --   --   --   AST 23  --   --   --   --   ALT 20  --   --   --   --  ALKPHOS 57  --   --   --   --   BILITOT 0.2*  --   --   --   --   GFRNONAA 16*   < > 19* 22* 20*  ANIONGAP 15   < > 10 9 8    < > = values in this interval not displayed.    Lipids  Recent Labs  Lab 12/07/21 0851  CHOL 134  TRIG 52  HDL 43  LDLCALC 81  CHOLHDL 3.1    Hematology Recent Labs  Lab 12/09/21 0122 12/10/21 0246 12/11/21 0328  WBC 8.5 8.5 7.5  RBC 3.24* 3.10* 2.94*  HGB 10.2* 9.7* 9.5*  HCT 30.8* 28.7* 27.3*  MCV 95.1 92.6 92.9  MCH 31.5 31.3 32.3  MCHC 33.1 33.8 34.8  RDW 12.2 12.2 12.1  PLT 147* 138* 147*   Thyroid  Recent Labs  Lab 12/07/21 0851  TSH 2.371    BNP Recent Labs  Lab 12/06/21 1950  BNP 509.9*    DDimer No results for input(s): "DDIMER" in the last 168 hours.   Radiology    CARDIAC CATHETERIZATION  Result Date: 12/10/2021 Conclusions: Diffuse ostial/proximal RCA disease with up to 90% stenosis, as detailed on last week's diagnostic catheterization report. Moderately elevated left ventricular filling pressure (LVEDP 30 mmHg). Successful IVUS-guided PCI using Synergy 3.5 x 32 mm drug-eluting stent (postdilated to 4.1 mm) with 0% residual stenosis and TIMI-3 flow. Recommendations: Continue dual antiplatelet therapy with aspirin and clopidogrel for at least 12 months. Aggressive secondary prevention. Avoid net even  fluid balance today; consider gentle diuresis beginning tomorrow (sooner if signs/symptoms of heart failure develop) if renal function allows. Medical therapy of left coronary artery disease noted on last week's diagnostic catheterization. Nelva Bush, MD [facility]   Cardiac Studies   Echo 12/07/2021 1. Akinesis of the inferolateral wall with moderate LV dysfunction; small  pericardial effusion with mild RV diastolic collapse; however IVC not  dilated and collapses with inspiration.   2. Left ventricular ejection fraction, by estimation, is 35 to 40%. The  left ventricle has moderately decreased function. The left ventricle  demonstrates regional wall motion abnormalities (see scoring  diagram/findings for description). There is  moderate concentric left ventricular hypertrophy. Left ventricular  diastolic parameters are consistent with Grade III diastolic dysfunction  (restrictive).   3. Right ventricular systolic function is normal. The right ventricular  size is normal.   4. A small pericardial effusion is present.   5. The mitral valve is normal in structure. No evidence of mitral valve  regurgitation. No evidence of mitral stenosis. Moderate mitral annular  calcification.   6. The aortic valve was not well visualized. Aortic valve regurgitation  is not visualized. No aortic stenosis is present.   7. The inferior vena cava is normal in size with greater than 50%  respiratory variability, suggesting right atrial pressure of 3 mmHg.   Comparison(s): No prior Echocardiogram.    Cath 12/07/2021   Mid RCA lesion is 20% stenosed.   Prox RCA lesion is 30% stenosed.   Ost RCA to Prox RCA lesion is 90% stenosed.   3rd RPL lesion is 60% stenosed.   Prox Cx lesion is 40% stenosed.   1st Mrg lesion is 99% stenosed.   Prox Cx to Mid Cx lesion is 70% stenosed.   Mid LAD lesion is 10% stenosed.   The LAD is a large caliber vessel that courses to the apex. The mid LAD stented  segment is patent  with minimal restenosis.  2.   The Circumflex is a moderate caliber vessel with two small obtuse marginal branches. The proximal Circumflex has mild to moderate stenosis. The mid Circumflex has moderate stenosis just beyond the obtuse marginal branch. The first obtuse marginal branch has sub-total occlusion but fills from left to left collaterals.  3.  The RCA is a large dominant artery with severe ostial stenosis. The mid vessel stented segment is patent. Moderate disease in the left posterolateral artery.  4.  LVEDP 21 mmHg 5.  45 cc contrast dye used    Recommendations: We could consider staged PCI of the ostial RCA. There is at least mild calcification of this lesion but PCI is an option. I would treat the disease in his Circumflex medically. Given his advanced kidney disease and his refusal to consider dialysis if it were necessary, we may opt to treat his CAD medically for now. Resume IV heparin 2 hours post radial sheath removal.    Cath 12/10/2021 Conclusions: Diffuse ostial/proximal RCA disease with up to 90% stenosis, as detailed on last week's diagnostic catheterization report. Moderately elevated left ventricular filling pressure (LVEDP 30 mmHg). Successful IVUS-guided PCI using Synergy 3.5 x 32 mm drug-eluting stent (postdilated to 4.1 mm) with 0% residual stenosis and TIMI-3 flow.   Recommendations: Continue dual antiplatelet therapy with aspirin and clopidogrel for at least 12 months. Aggressive secondary prevention. Avoid net even fluid balance today; consider gentle diuresis beginning tomorrow (sooner if signs/symptoms of heart failure develop) if renal function allows. Medical therapy of left coronary artery disease noted on last week's diagnostic catheterization.  LV end diastolic pressure is moderately elevated. LVEDP 30 mmHg.    Patient Profile     71 y.o. male with PMH of CAD (MI 1999 unknown detail, inferior MI s/p stent RCA and LAD), ICM/HFrEF, HTN,  HLD, CKD stage IIIb-IV, proteinuria, DM and tobacco abuse who presented on 12/07/2021 with chest pain, elevated troponin and new LBBB.   Assessment & Plan    NSTEMI - 36->373->1730->3199 -> 4829 -cath 12/07/2021 30% prox RCA, 20% mid RCA, 90% ost RCA recommend staged PCI, 99% OM1 with L to L collaterals, 70% prox to mid LCx recommend medical therapy -cath 12/10/2021 90% ost to prox RCA lesion treated with IVUS guided PCI using Synergy 3.5x32 mm DES. LVEDP 30 mmHg - doing well post cath. Continue ASA and Plaivx. his LVEDP was 30 mmHg yesterday during cath, ideally I would like to dose him with 20mg  IV lasix, however hesistant to use lasix while Cr is still trending up, will discuss with MD.   CAP: managed by primary team  CAD: On ASA, lipitor, coreg and plavix  ICM  - Echo 12/07/2021 EF 35-40%, akinesis of inferolateral wall with moderate LV dysfunction, small pericardial effusion, moderate LVH, grade 3 DD  HTN: unable to take ARB/ACEI, entresto or spironolactone due to renal function. Takes Cialis on a daily basis, hesitant to give imdur. Has evidence of LVH and grade 3 DD on echo despite he says his BP is normally controlled at home, will add 25mg  BID hydralazine for now. Continue amlodipine and coreg  HLD: on lipitor 80mg  daily  DM   CKD stage IIIb-IV: patient is adamently against long term hemodialysis - Cr 2.93-->3.18. Keep another day? Per patient, he is due to blood work by his nephrologist in [place] this Friday. Cannot recall the name of the nephrologist in [place].       For questions or updates, please contact [place]  Please consult [email or web address] for contact info under        Signed, Almyra Deforest, [place]  12/11/2021, 8:18 AM    Patient seen, examined. Available data reviewed. Agree with findings, assessment, and plan as outlined by Almyra Deforest, PA.  The patient is independently interviewed and examined.  He is alert, oriented, no distress.  HEENT is normal, neck  with normal JVP, lungs are clear bilaterally, heart is regular rate rhythm no murmur gallop, abdomen soft and nontender, right radial cath site is clear, there is no pretibial edema.  The patient's creatinine has trended up to a modest degree to 3.18 mg/dL today.  I agree that with his stage IV CKD, we should repeat his labs tomorrow to make sure he does not have significant AKI from contrast nephropathy.  I would hold on IV diuretic therapy at present.  Will need to be discharged on a loop diuretic but would like to see that his kidney function is stable tomorrow before starting this.  Agree with medical therapy as outlined above.  Sherren Mocha, M.D. 12/11/2021 12:03 PM

## 2021-12-11 NOTE — Evaluation (Signed)
Occupational Therapy Evaluation Patient Details Name: Austin Weber MRN: [id number] DOB: 1950-08-27 Today's Date: 12/11/2021   History of Present Illness Pt is a 71 year old male who presented on 12/07/2021 with chest pain, elevated troponin and new LBBB, also admitted with working diagnosis of community acquired pneumonia, complicated with respiratory failure; Cath on 12/08/19, 12/4 with successful IVUS-guided PCI ; PMH significant for CAD (MI 1999 unknown detail, inferior MI s/p stent RCA and LAD), ICM/HFrEF, HTN, HLD, CKD stage IIIb-IV, proteinuria, DM and tobacco abuse   Clinical Impression   Chart reviewed, RN cleared pt for participation in OT evaluation. Pt is alert and oriented x4, requires encouragements for participation in therapeutic tasks however is agreeable after encouragement. PTA Pt is indep in ADL/IADL, amb no AD, lives alone. Pt presents with deficits in strength, endurance, activity tolerance, balance affecting optimal ADL completion. OT will follow acutely to address functional deficits, no OT follow up recommended following discharge.      Recommendations for follow up therapy are one component of a multi-disciplinary discharge planning process, led by the attending physician.  Recommendations may be updated based on patient status, additional functional criteria and insurance authorization.   Follow Up Recommendations  No OT follow up     Assistance Recommended at Discharge Intermittent Supervision/Assistance  Patient can return home with the following Assistance with cooking/housework    Functional Status Assessment  Patient has had a recent decline in their functional status and demonstrates the ability to make significant improvements in function in a reasonable and predictable amount of time.  Equipment Recommendations  Tub/shower seat    Recommendations for Other Services       Precautions / Restrictions Precautions Precautions: Fall Restrictions Weight  Bearing Restrictions: No      Mobility Bed Mobility Overal bed mobility: Modified Independent                  Transfers Overall transfer level: Needs assistance Equipment used: None Transfers: Sit to/from Stand Sit to Stand: Supervision                  Balance Overall balance assessment: Needs assistance Sitting-balance support: Feet supported Sitting balance-Leahy Scale: Good     Standing balance support: No upper extremity supported Standing balance-Leahy Scale: Good                             ADL either performed or assessed with clinical judgement   ADL Overall ADL's : Needs assistance/impaired Eating/Feeding: Set up;Sitting   Grooming: Wash/dry hands;Wash/dry face;Oral care;Standing;Supervision/safety Grooming Details (indicate cue type and reason): sink level             Lower Body Dressing: Set up Lower Body Dressing Details (indicate cue type and reason): socks at edge of bed Toilet Transfer: Supervision/safety Toilet Transfer Details (indicate cue type and reason): simulated, no AD, intermittent vcs for pacing Toileting- Clothing Manipulation and Hygiene: Supervision/safety Toileting - Clothing Manipulation Details (indicate cue type and reason): anticipated     Functional mobility during ADLs: Supervision/safety;Cueing for safety;Min guard (approx 15' in room, no AD, intermittent CGA)       Vision Patient Visual Report: No change from baseline       Perception     Praxis      Pertinent Vitals/Pain Pain Assessment Pain Assessment: No/denies pain     Hand Dominance     Extremity/Trunk Assessment Upper Extremity Assessment Upper Extremity Assessment: Overall WFL for  tasks assessed (tested within cath precuations through functional tasks, appear [facility])   Lower Extremity Assessment Lower Extremity Assessment: Generalized weakness   Cervical / Trunk Assessment Cervical / Trunk Assessment: Normal   Communication  Communication Communication: No difficulties   Cognition Arousal/Alertness: Awake/alert Behavior During Therapy: WFL for tasks assessed/performed Overall Cognitive Status: Within Functional Limits for tasks assessed                                 General Comments: vcs for participation however pt in agreement after encouragement     General Comments  vital signs monitored, appear stable throughout    Exercises Other Exercises Other Exercises: edu re: role of OT, role of rehab, discharge recommendations, home safety, falls prevention, activity pacing, precautions   Shoulder Instructions      Home Living Family/patient expects to be discharged to:: Private residence Living Arrangements: Alone Available Help at Discharge: Family;Friend(s) (unsure) Type of Home: House Home Access: Level entry     Home Layout: One level     Bathroom Shower/Tub: Teacher, early years/pre: Standard     Home Equipment: None          Prior Functioning/Environment Prior Level of Function : Independent/Modified Independent             Mobility Comments: amb no AD; no reported falls ADLs Comments: indep ADL/IADL, reports he may have friends that could help him if needed        OT Problem List: Decreased activity tolerance;Decreased strength;Impaired balance (sitting and/or standing);Decreased knowledge of use of DME or AE      OT Treatment/Interventions: Self-care/ADL training;Patient/family education;Therapeutic exercise;Balance training;Energy conservation;Therapeutic activities;DME and/or AE instruction    OT Goals(Current goals can be found in the care plan section) Acute Rehab OT Goals Patient Stated Goal: go home as soon as possible OT Goal Formulation: With patient Time For Goal Achievement: 12/25/21 Potential to Achieve Goals: Good ADL Goals Pt Will Perform Grooming: with modified independence Pt Will Perform Lower Body Dressing: with modified  independence Pt Will Transfer to Toilet: with modified independence;ambulating Pt Will Perform Toileting - Clothing Manipulation and hygiene: with modified independence  OT Frequency: Min 2X/week    Co-evaluation              AM-PAC OT "6 Clicks" Daily Activity     Outcome Measure Help from another person eating meals?: None Help from another person taking care of personal grooming?: None Help from another person toileting, which includes using toliet, bedpan, or urinal?: None Help from another person bathing (including washing, rinsing, drying)?: A Little Help from another person to put on and taking off regular upper body clothing?: None Help from another person to put on and taking off regular lower body clothing?: None 6 Click Score: 23   End of Session Nurse Communication: Mobility status  Activity Tolerance: Patient tolerated treatment well Patient left: in bed;with call bell/phone within reach;Other (comment) (Ship broker and RN of need for recliner in room)  OT Visit Diagnosis: Unsteadiness on feet (R26.81)                Time: [id number] OT Time Calculation (min): 14 min Charges:  OT General Charges $OT Visit: 1 Visit OT Evaluation $OT Eval Low Complexity: [place], OTD OTR/L  12/11/21, 10:28 AM

## 2021-12-11 NOTE — Progress Notes (Addendum)
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  O2 was 94% pregait and post gait 99%-100%.  Pt is not symptomatic with mobility, not demonstrating any issues with LOB. Follow for completion of stay tomorrow to practice steps and another walk with monitor of symptoms and tolerances with vitals.  POC with goals of acute PT is below.       Recommendations for follow up therapy are one component of a multi-disciplinary discharge planning process, led by the attending physician.  Recommendations may be updated based on patient status, additional functional criteria and insurance authorization.  Follow Up Recommendations No PT follow up      Assistance Recommended at Discharge PRN  Patient can return home with the following  Assist for transportation;Assistance with cooking/housework    Equipment Recommendations None recommended by PT  Recommendations for Other Services       Functional Status Assessment Patient has had a recent decline in their functional status and demonstrates the ability to make significant improvements in function in a reasonable and predictable amount of time.     Precautions / Restrictions Precautions Precautions: Fall Precaution Comments: monitor O2 and HR Restrictions Weight Bearing  Restrictions: No      Mobility  Bed Mobility Overal bed mobility: Modified Independent                  Transfers Overall transfer level: Needs assistance Equipment used: None Transfers: Sit to/from Stand Sit to Stand: Supervision           General transfer comment: supervised due to lines but is moving alone    Ambulation/Gait Ambulation/Gait assistance: Supervision Gait Distance (Feet): 150 Feet Assistive device: None Gait Pattern/deviations: Step-through pattern, Decreased stride length, Narrow base of support Gait velocity: reduced due to hallway clutter Gait velocity interpretation: <1.31 ft/sec, indicative of household ambulator Pre-gait activities: standing balance and sat ck General Gait Details: pt is self managing balance and has no observed LOB  Stairs            Wheelchair Mobility    Modified Rankin (Stroke Patients Only)       Balance Overall balance assessment: Modified Independent Sitting-balance support: Feet supported Sitting balance-Leahy Scale: Good     Standing balance support: No upper extremity supported Standing balance-Leahy Scale: Good                               Pertinent Vitals/Pain Pain Assessment Pain Assessment: No/denies pain    Home Living Family/patient expects to be discharged to:: Private residence Living Arrangements: Alone Available Help at Discharge: Family;Friend(s) Type of Home: House Home Access: Level entry       Home Layout: One level Home Equipment: None Additional Comments: drives and managed home alone    Prior Function Prior Level of  Function : Independent/Modified Independent             Mobility Comments: amb no AD; no reported falls ADLs Comments: indep ADL/IADL, reports he may have friends that could help him if needed     Hand Dominance   Dominant Hand: Right    Extremity/Trunk Assessment   Upper Extremity Assessment Upper Extremity Assessment: Defer to  OT evaluation    Lower Extremity Assessment Lower Extremity Assessment: Overall WFL for tasks assessed    Cervical / Trunk Assessment Cervical / Trunk Assessment: Normal  Communication   Communication: No difficulties  Cognition Arousal/Alertness: Awake/alert Behavior During Therapy: WFL for tasks assessed/performed Overall Cognitive Status: Within Functional Limits for tasks assessed                                          General Comments General comments (skin integrity, edema, etc.): pt is observed walking wiht no AD, was able to maintain sats f5rom 94% pre gait to 100% post gait with no SOB or light headed feelings    Exercises     Assessment/Plan    PT Assessment Patient needs continued PT services  PT Problem List Decreased activity tolerance;Cardiopulmonary status limiting activity;Decreased mobility       PT Treatment Interventions DME instruction;Gait training;Functional mobility training;Therapeutic activities;Therapeutic exercise;Balance training;Neuromuscular re-education;Patient/family education;Stair training    PT Goals (Current goals can be found in the Care Plan section)  Acute Rehab PT Goals Patient Stated Goal: to get home today if possible PT Goal Formulation: With patient Time For Goal Achievement: 12/25/21 Potential to Achieve Goals: Good    Frequency Min 3X/week     Co-evaluation               AM-PAC PT "6 Clicks" Mobility  Outcome Measure Help needed turning from your back to your side while in a flat bed without using bedrails?: None Help needed moving from lying on your back to sitting on the side of a flat bed without using bedrails?: A Little Help needed moving to and from a bed to a chair (including a wheelchair)?: A Little Help needed standing up from a chair using your arms (e.g., wheelchair or bedside chair)?: A Little Help needed to walk in hospital room?: A Little Help needed climbing 3-5 steps with a  railing? : A Lot 6 Click Score: 18    End of Session Equipment Utilized During Treatment: Gait belt Activity Tolerance: Patient tolerated treatment well Patient left: in bed;with call bell/phone within reach;with bed alarm set Nurse Communication: Mobility status PT Visit Diagnosis: Difficulty in walking, not elsewhere classified (R26.2)    Time: 5573-2202 PT Time Calculation (min) (ACUTE ONLY): 23 min   Charges:   PT Evaluation $PT Eval Moderate Complexity: 1 Mod PT Treatments $Gait Training: 8-22 mins       Ramond Dial 12/11/2021, 12:51 PM  Mee Hives, PT PhD Acute Rehab Dept. Number: [place] [facility] [place]

## 2021-12-11 NOTE — Care Management Important Message (Signed)
Important Message  Patient Details  Name: Austin Weber MRN: [id number] Date of Birth: 10/20/1950   Medicare Important Message Given:  Yes     [name] 12/11/2021, 9:42 AM

## 2021-12-11 NOTE — Progress Notes (Signed)
CARDIAC REHAB PHASE I   PRE:  Rate/Rhythm: 80 SR  BP:  Sitting: 172/68      SaO2: 94 RA  MODE:  Ambulation: 150 ft   POST:  Rate/Rhythm: 93 SR  BP:  Sitting: 146/63      SaO2: 97 RA    Pt ambulated in hall independently, tolerating well with no cp or sob. Returned to room to bed with call bell and bedside table in reach. Post MI/stent education including site care, restrictions, risk factors, heart healthy diet, MI booklet, antiplatelet therapy importance, smoking cessation, exercise guidelines and CRP2 reviewed. All questions and concerns addressed. Will refer to AP for CRP2. Will continue to follow.    [id number]  Vanessa Barbara, RN BSN 12/11/2021 11:53 AM

## 2021-12-11 NOTE — Progress Notes (Signed)
   Heart Failure Stewardship Pharmacist Progress Note   PCP: Dettinger, Fransisca Kaufmann, MD PCP-Cardiologist: None    HPI:  71 yo M with PMH of CKD IIIb, T2DM, anemia, CHF, and HTN.   He presented to the ED on 11/30 with shortness of breath. CXR with R bibasilar PNA. ECHO 12/1 with LVEF 35-40%, akinesis of the inferolateral wall, regional wall motion abnormalities, G3DD, RV normal, small pericardial effusion. Taken for [facility] on 12/1 and found to have multivessel CAD with patent prior stents, but new lesion in RCA. Post cath chest discomfort. Staged PCI to RCA done on 12/4, LVEDP 30.  Current HF Medications: Beta Blocker: carvedilol 25 mg BID Other: hydralazine 25 mg BID  Prior to admission HF Medications: Beta blocker: carvedilol 25 mg BID  Pertinent Lab Values: Serum creatinine 3.18, BUN 48, Potassium 4.1, Sodium 137, BNP 509.9, Magnesium 2.0, A1c 6.8  Vital Signs: Weight: 189 lbs (admission weight: 194 lbs) Blood pressure: 160/70s  Heart rate: 70s  I/O: -0.5L yesterday; net -3.4L  Medication Assistance / Insurance Benefits Check: Does the patient have prescription insurance?  Yes Type of insurance plan: [facility] Medicare  Outpatient Pharmacy:  Prior to admission outpatient pharmacy: [facility] Is the patient willing to use [facility] at discharge? Yes Is the patient willing to transition their outpatient pharmacy to utilize a [facility] outpatient pharmacy?   Pending    Assessment: 1. 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 NYHA class II symptoms. - Not volume overloaded on exam, LVEDP elevated on cath. Will likely need diuretic on discharge. - Continue carvedilol 25 mg BID - No ACE/ARB/ARNI, MRA, or SGLT2i with advanced CKD. eGFR 22. - He takes tadalafil regularly at home. Caution nitrates. - Continue hydralazine 25 mg BID,  may need to increase to TID   Plan: 1) Medication changes recommended at this time: - Increase hydralazine to TID tomorrow if BP still  elevated   2) Patient assistance: - None pending  3)  Education  - Patient has been educated on current HF medications and potential additions to HF medication regimen - Patient verbalizes understanding that over the next few months, these medication doses may change and more medications may be added to optimize HF regimen - Patient has been educated on basic disease state pathophysiology and goals of therapy   Kerby Nora, PharmD, BCPS Heart Failure Stewardship Pharmacist Phone [phone]

## 2021-12-11 NOTE — Progress Notes (Signed)
PROGRESS NOTE    Austin Weber  ZOX:[id number] DOB: 11/27/1950 DOA: 12/06/2021 PCP: Dettinger, Fransisca Kaufmann, MD  71 y.o. male with PMH of CAD ( inferior MI s/p stent RCA and LAD), ICM/HFrEF, HTN, HLD, CKD stage IIIb-IV, proteinuria, DM and tobacco abuse who presented on 12/07/2021 with chest pain, elevated troponin and new LBBB.   -underwent Staged PCI of Ostial to Prox RCA lesion -Rx w/ DES 12/4, LVEDP 30  Subjective: Feels well, denies any complaints, anxious to go home  Assessment and Plan:  NSTEMI CAD -EKG with new left bundle branch block, echocardiogram with positive well motion abnormalities.  -Cards following -cath 12/07/2021 30% prox RCA, 20% mid RCA, 90% ost RCA recommend staged PCI, 99% OM1 with L to L collaterals, 70% prox to mid LCx recommend medical therapy -Repeat cath 12/10/2021 90% ost to prox RCA lesion treated with DES. LVEDP 30 mmHg -Continue ASA/Plavix/Coreg/statin  Acute systolic heart failure (HCC) Echo w/ EF 35 to 40%, moderate LVH, RV systolic function preserved. Akinesis of the inferolateral wall with moderate LV dysfunction. -Left ventricle end diastolic pressure 30 mmHg.  -GDMT limited by CKD, continue coreg -lasix on hold w/ AKI, will need to be resumed at discharge  AKi on CKD4 -baseline creat 2.2, peaked at 4.2 -given IVF pre-cath, ARB on hold Avoid hypotension, monitor for contrast nephropathy -Mild uptrend in creatinine, hopefully plateauing, would monitor for 24 hours  Essential hypertension -Continue amlodipine and carvedilol. Added hydralazine 25 mg bid.   Type 2 diabetes mellitus with hyperlipidemia (HCC) -stable, SSI  Cigarette nicotine dependence without complication Smoking cessation counseling.    DVT prophylaxis: Hep SQ Code Status: Full Code Family Communication: Disposition Plan:   Consultants: Cards   Procedures: Cath 12/10/2021 Conclusions: Diffuse ostial/proximal RCA disease with up to 90% stenosis, as detailed on last  week's diagnostic catheterization report. Moderately elevated left ventricular filling pressure (LVEDP 30 mmHg). Successful IVUS-guided PCI using Synergy 3.5 x 32 mm drug-eluting stent (postdilated to 4.1 mm) with 0% residual stenosis and TIMI-3 flow.   Recommendations: Continue dual antiplatelet therapy with aspirin and clopidogrel for at least 12 months. Aggressive secondary prevention. Avoid net even fluid balance today; consider gentle diuresis beginning tomorrow (sooner if signs/symptoms of heart failure develop) if renal function allows. Medical therapy of left coronary artery disease noted on last week's diagnostic catheterization.  Antimicrobials:    Objective: Vitals:   12/11/21 1017 12/11/21 1644 12/11/21 2100 12/12/21 0514  BP:  (!) 157/70 (!) 151/62 (!) 146/65  Pulse:  80 72 84  Resp:  18 18 16   Temp:  98 F (36.7 C) 97.9 F (36.6 C) 98 F (36.7 C)  TempSrc:  Oral Oral Oral  SpO2: 95% 100% 100% 98%  Weight:      Height:        Intake/Output Summary (Last 24 hours) at 12/12/2021 8119 Last data filed at 12/12/2021 0515 Gross per 24 hour  Intake --  Output 300 ml  Net -300 ml   Filed Weights   12/09/21 0510 12/10/21 0453 12/11/21 0650  Weight: 86 kg 86 kg 85.9 kg    Examination:  Gen: Awake, Alert, Oriented X 3,  HEENT: no JVD Lungs: Good air movement bilaterally, CTAB CVS: S1S2/RRR Abd: soft, Non tender, non distended, BS present Extremities: No edema Skin: no new rashes on exposed skin     Data Reviewed:   CBC: Recent Labs  Lab 12/07/21 0040 12/07/21 0851 12/08/21 0326 12/09/21 0122 12/10/21 0246 12/11/21 0328 12/12/21 0210  WBC 9.4   < >  8.3 8.5 8.5 7.5 7.8  NEUTROABS 7.8*  --   --   --   --   --   --   HGB 10.7*   < > 10.0* 10.2* 9.7* 9.5* 9.4*  HCT 33.5*   < > 30.1* 30.8* 28.7* 27.3* 28.7*  MCV 97.1   < > 95.0 95.1 92.6 92.9 95.0  PLT 130*   < > 139* 147* 138* 147* 143*   < > = values in this interval not displayed.   Basic  Metabolic Panel: Recent Labs  Lab 12/07/21 0040 12/08/21 0326 12/09/21 0122 12/10/21 0246 12/11/21 0328 12/12/21 0210  NA 140 140 140 138 137 139  K 4.5 4.2 4.1 4.1 4.1 4.2  CL 108 113* 112* 111 110 107  CO2 17* 18* 18* 18* 19* 19*  GLUCOSE 289* 117* 110* 148* 171* 173*  BUN 63* 48* 52* 53* 48* 52*  CREATININE 3.72* 3.09* 3.25* 2.93* 3.18* 3.29*  CALCIUM 9.0 9.2 9.0 8.5* 8.6* 9.2  MG 2.0  --   --   --   --   --   PHOS 5.3*  --   --   --   --   --    GFR: Estimated Creatinine Clearance: 22.6 mL/min (A) (by C-G formula based on SCr of 3.29 mg/dL (H)). Liver Function Tests: Recent Labs  Lab 12/07/21 0040  AST 23  ALT 20  ALKPHOS 57  BILITOT 0.2*  PROT 5.5*  ALBUMIN 2.8*   No results for input(s): "LIPASE", "AMYLASE" in the last 168 hours. No results for input(s): "AMMONIA" in the last 168 hours. Coagulation Profile: Recent Labs  Lab 12/06/21 1950  INR 1.1   Cardiac Enzymes: No results for input(s): "CKTOTAL", "CKMB", "CKMBINDEX", "TROPONINI" in the last 168 hours. BNP (last 3 results) No results for input(s): "PROBNP" in the last 8760 hours. HbA1C: No results for input(s): "HGBA1C" in the last 72 hours. CBG: Recent Labs  Lab 12/08/21 1611 12/08/21 2049 12/09/21 0512 12/09/21 0800 12/09/21 1123  GLUCAP 151* 137* 120* 127* 153*   Lipid Profile: No results for input(s): "CHOL", "HDL", "LDLCALC", "TRIG", "CHOLHDL", "LDLDIRECT" in the last 72 hours. Thyroid Function Tests: No results for input(s): "TSH", "T4TOTAL", "FREET4", "T3FREE", "THYROIDAB" in the last 72 hours. Anemia Panel: No results for input(s): "VITAMINB12", "FOLATE", "FERRITIN", "TIBC", "IRON", "RETICCTPCT" in the last 72 hours. Urine analysis:    Component Value Date/Time   APPEARANCEUR Clear 03/31/2020 1038   GLUCOSEU 1+ (A) 03/31/2020 1038   BILIRUBINUR Negative 03/31/2020 1038   PROTEINUR 3+ (A) 03/31/2020 1038   NITRITE Negative 03/31/2020 1038   LEUKOCYTESUR Negative 03/31/2020 1038    Sepsis Labs: @LABRCNTIP (procalcitonin:4,lacticidven:4)  ) Recent Results (from the past 240 hour(s))  Resp Panel by RT-PCR (Flu A&B, Covid) Anterior Nasal Swab     Status: None   Collection Time: 12/06/21  8:47 PM   Specimen: Anterior Nasal Swab  Result Value Ref Range Status   SARS Coronavirus 2 by RT PCR NEGATIVE NEGATIVE Final    Comment: (NOTE) SARS-CoV-2 target nucleic acids are NOT DETECTED.  The SARS-CoV-2 RNA is generally detectable in upper respiratory specimens during the acute phase of infection. The lowest concentration of SARS-CoV-2 viral copies this assay can detect is 138 copies/mL. A negative result does not preclude SARS-Cov-2 infection and should not be used as the sole basis for treatment or other patient management decisions. A negative result may occur with  improper specimen collection/handling, submission of specimen other than nasopharyngeal swab, presence of viral  mutation(s) within the areas targeted by this assay, and inadequate number of viral copies(<138 copies/mL). A negative result must be combined with clinical observations, patient history, and epidemiological information. The expected result is Negative.  Fact Sheet for Patients:  EntrepreneurPulse.com.au  Fact Sheet for Healthcare Providers:  IncredibleEmployment.be  This test is no t yet approved or cleared by the [place] FDA and  has been authorized for detection and/or diagnosis of SARS-CoV-2 by FDA under an Emergency Use Authorization (EUA). This EUA will remain  in effect (meaning this test can be used) for the duration of the COVID-19 declaration under Section 564(b)(1) of the Act, 21 U.S.C.section 360bbb-3(b)(1), unless the authorization is terminated  or revoked sooner.       Influenza A by PCR NEGATIVE NEGATIVE Final   Influenza B by PCR NEGATIVE NEGATIVE Final    Comment: (NOTE) The Xpert Xpress SARS-CoV-2/FLU/RSV plus assay is  intended as an aid in the diagnosis of influenza from Nasopharyngeal swab specimens and should not be used as a sole basis for treatment. Nasal washings and aspirates are unacceptable for Xpert Xpress SARS-CoV-2/FLU/RSV testing.  Fact Sheet for Patients: EntrepreneurPulse.com.au  Fact Sheet for Healthcare Providers: IncredibleEmployment.be  This test is not yet approved or cleared by the [place] FDA and has been authorized for detection and/or diagnosis of SARS-CoV-2 by FDA under an Emergency Use Authorization (EUA). This EUA will remain in effect (meaning this test can be used) for the duration of the COVID-19 declaration under Section 564(b)(1) of the Act, 21 U.S.C. section 360bbb-3(b)(1), unless the authorization is terminated or revoked.  Performed at [facility], [place] [address] [place], [place] [address]      Radiology Studies: CARDIAC CATHETERIZATION  Result Date: 12/10/2021 Conclusions: Diffuse ostial/proximal RCA disease with up to 90% stenosis, as detailed on last week's diagnostic catheterization report. Moderately elevated left ventricular filling pressure (LVEDP 30 mmHg). Successful IVUS-guided PCI using Synergy 3.5 x 32 mm drug-eluting stent (postdilated to 4.1 mm) with 0% residual stenosis and TIMI-3 flow. Recommendations: Continue dual antiplatelet therapy with aspirin and clopidogrel for at least 12 months. Aggressive secondary prevention. Avoid net even fluid balance today; consider gentle diuresis beginning tomorrow (sooner if signs/symptoms of heart failure develop) if renal function allows. Medical therapy of left coronary artery disease noted on last week's diagnostic catheterization. Nelva Bush, MD [facility]    Scheduled Meds:  amLODipine  10 mg Oral Daily   aspirin  81 mg Oral Daily   atorvastatin  80 mg Oral Daily   carvedilol  25 mg Oral BID WC   cefdinir  300 mg Oral Daily   clopidogrel  600 mg  Oral Once   Followed by   clopidogrel  75 mg Oral Daily   heparin  5,000 Units Subcutaneous Q8H   hydrALAZINE  25 mg Oral BID   pantoprazole  40 mg Oral Daily   sodium chloride flush  3 mL Intravenous Q12H   sodium chloride flush  3 mL Intravenous Q12H   Continuous Infusions:  sodium chloride       LOS: 6 days    Time spent: 77min    Domenic Polite, MD [facility]   12/12/2021, 6:08 AM

## 2021-12-12 LAB — BASIC METABOLIC PANEL
Anion gap: 13 (ref 5–15)
BUN: 52 mg/dL — ABNORMAL HIGH (ref 8–23)
CO2: 19 mmol/L — ABNORMAL LOW (ref 22–32)
Calcium: 9.2 mg/dL (ref 8.9–10.3)
Chloride: 107 mmol/L (ref 98–111)
Creatinine, Ser: 3.29 mg/dL — ABNORMAL HIGH (ref 0.61–1.24)
GFR, Estimated: 19 mL/min — ABNORMAL LOW (ref >60)
Glucose, Bld: 173 mg/dL — ABNORMAL HIGH (ref 70–99)
Potassium: 4.2 mmol/L (ref 3.5–5.1)
Sodium: 139 mmol/L (ref 135–145)

## 2021-12-12 LAB — CBC
HCT: 28.7 % — ABNORMAL LOW (ref 39.0–52.0)
Hemoglobin: 9.4 g/dL — ABNORMAL LOW (ref 13.0–17.0)
MCH: 31.1 pg (ref 26.0–34.0)
MCHC: 32.8 g/dL (ref 30.0–36.0)
MCV: 95 fL (ref 80.0–100.0)
Platelets: 143 10*3/uL — ABNORMAL LOW (ref 150–400)
RBC: 3.02 MIL/uL — ABNORMAL LOW (ref 4.22–5.81)
RDW: 12.2 % (ref 11.5–15.5)
WBC: 7.8 10*3/uL (ref 4.0–10.5)
nRBC: 0 % (ref 0.0–0.2)

## 2021-12-12 LAB — LIPOPROTEIN A (LPA): Lipoprotein (a): 54.2 nmol/L — ABNORMAL HIGH (ref <75.0)

## 2021-12-12 MED ORDER — HYDRALAZINE HCL 50 MG PO TABS
50.0000 mg | ORAL_TABLET | Freq: Two times a day (BID) | ORAL | Status: DC
  Administered 2021-12-12: 50 mg via ORAL
  Filled 2021-12-12: qty 1

## 2021-12-12 MED ORDER — HYDRALAZINE HCL 25 MG PO TABS
25.0000 mg | ORAL_TABLET | Freq: Once | ORAL | Status: AC
  Administered 2021-12-12: 25 mg via ORAL
  Filled 2021-12-12: qty 1

## 2021-12-12 MED ORDER — CLOPIDOGREL BISULFATE 75 MG PO TABS
75.0000 mg | ORAL_TABLET | Freq: Every day | ORAL | Status: DC
  Administered 2021-12-12 – 2021-12-13 (×2): 75 mg via ORAL
  Filled 2021-12-12 (×2): qty 1

## 2021-12-12 MED FILL — Famotidine in NaCl 0.9% IV Soln 20 MG/50ML: INTRAVENOUS | Qty: 50 | Status: AC

## 2021-12-12 NOTE — Progress Notes (Signed)
CARDIAC REHAB PHASE I     Pt resting in bed. Ambulated in hall and in room today. Tolerating well with no cp or sob per pt. Reviewed education provided yesterday. No questions or concerns. Pt is anxious to go home. Will continue to follow.    [id number] Vanessa Barbara, RN BSN 12/12/2021 11:43 AM

## 2021-12-12 NOTE — Progress Notes (Signed)
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 Pt able to perform seated warm up and ambulating 400 ft with no assistive device independently. SpO2 96% on RA, HR 74-101 bpm. Pt educated on activity recommendations and progression including a generalized walking program. Pt with no further acute PT needs. Thank you for this consult.    Recommendations for follow up therapy are one component of a multi-disciplinary discharge planning process, led by the attending physician.  Recommendations may be updated based on patient status, additional functional criteria and insurance authorization.  Follow Up Recommendations  No PT follow up     Assistance Recommended at Discharge PRN  Patient can return home with the following Assist for transportation;Assistance with cooking/housework   Equipment Recommendations  None recommended by PT    Recommendations for Other Services       Precautions / Restrictions Precautions Precautions: None Restrictions Weight Bearing Restrictions: No     Mobility  Bed Mobility Overal bed mobility: Modified Independent                  Transfers Overall transfer level: Independent Equipment used: None                    Ambulation/Gait Ambulation/Gait assistance: Independent Gait Distance (Feet): 400 Feet Assistive device:  None Gait Pattern/deviations: WFL(Within Functional Limits)           Stairs             Wheelchair Mobility    Modified Rankin (Stroke Patients Only)       Balance Overall balance assessment: Independent Sitting-balance support: Feet supported Sitting balance-Leahy Scale: Normal     Standing balance support: No upper extremity supported Standing balance-Leahy Scale: Good                              Cognition Arousal/Alertness: Awake/alert Behavior During Therapy: WFL for tasks assessed/performed Overall Cognitive Status: Within Functional Limits for tasks assessed                                          Exercises General Exercises - Lower Extremity Long Arc Quad: Both, 10 reps, Seated Hip Flexion/Marching: Both, 10 reps, Seated Heel Raises: Both, 10 reps, Seated    General Comments        Pertinent Vitals/Pain      Home Living                          Prior Function            PT Goals (current goals can now be found in the care plan section) Acute Rehab PT Goals Patient Stated Goal: to get home today if possible PT Goal Formulation:  With patient Time For Goal Achievement: 12/25/21 Potential to Achieve Goals: Good Progress towards PT goals: Goals met/education completed, patient discharged from PT    Frequency    Min 3X/week      PT Plan      Co-evaluation              AM-PAC PT "6 Clicks" Mobility   Outcome Measure  Help needed turning from your back to your side while in a flat bed without using bedrails?: None Help needed moving from lying on your back to sitting on the side of a flat bed without using bedrails?: None Help needed moving to and from a bed to a chair (including a wheelchair)?: None Help needed standing up from a chair using your arms (e.g., wheelchair or bedside chair)?: None Help needed to walk in hospital room?: None Help needed climbing 3-5 steps with a railing?  : A Little 6 Click Score: 23    End of Session   Activity Tolerance: Patient tolerated treatment well Patient left: in bed;with call bell/phone within reach;with bed alarm set Nurse Communication: Mobility status PT Visit Diagnosis: Difficulty in walking, not elsewhere classified (R26.2)     Time: 7737-3668 PT Time Calculation (min) (ACUTE ONLY): 11 min  Charges:  $Therapeutic Activity: 8-22 mins                     Wyona Almas, PT, DPT Acute Rehabilitation Services Office [phone]    Deno Etienne 12/12/2021, 8:55 AM

## 2021-12-12 NOTE — Progress Notes (Addendum)
Rounding Note    Patient Name: Austin Weber Date of Encounter: 12/12/2021  [place] Cardiologist: None   Subjective   No chest pain this morning. Has been ambulating in the hallway.   Inpatient Medications    Scheduled Meds:  amLODipine  10 mg Oral Daily   aspirin  81 mg Oral Daily   atorvastatin  80 mg Oral Daily   carvedilol  25 mg Oral BID WC   cefdinir  300 mg Oral Daily   clopidogrel  75 mg Oral Daily   heparin  5,000 Units Subcutaneous Q8H   hydrALAZINE  25 mg Oral BID   pantoprazole  40 mg Oral Daily   sodium chloride flush  3 mL Intravenous Q12H   sodium chloride flush  3 mL Intravenous Q12H   Continuous Infusions:  sodium chloride     PRN Meds: sodium chloride, HYDROmorphone (DILAUDID) injection, ipratropium-albuterol, oxyCODONE, sodium chloride flush   Vital Signs    Vitals:   12/11/21 1644 12/11/21 2100 12/12/21 0514 12/12/21 0751  BP: (!) 157/70 (!) 151/62 (!) 146/65 (!) 146/88  Pulse: 80 72 84 91  Resp: 18 18 16 16   Temp: 98 F (36.7 C) 97.9 F (36.6 C) 98 F (36.7 C) (!) 97.4 F (36.3 C)  TempSrc: Oral Oral Oral Oral  SpO2: 100% 100% 98% 98%  Weight:      Height:        Intake/Output Summary (Last 24 hours) at 12/12/2021 0853 Last data filed at 12/12/2021 0515 Gross per 24 hour  Intake --  Output 300 ml  Net -300 ml      12/11/2021    6:50 AM 12/10/2021    4:53 AM 12/09/2021    5:10 AM  Last 3 Weights  Weight (lbs) 189 lb 6 oz 189 lb 11.2 oz 189 lb 9.5 oz  Weight (kg) 85.9 kg 86.047 kg 86 kg      Telemetry    Sinus Rhythm, LBBB - Personally Reviewed  ECG    No new tracings  Physical Exam   GEN: No acute distress.   Neck: No JVD Cardiac: RRR, no murmurs, rubs, or gallops.  Respiratory: Clear to auscultation bilaterally. Mild crackles in bases GI: Soft, nontender, non-distended  MS: No edema; No deformity. Neuro:  Nonfocal  Psych: Normal affect   Labs    High Sensitivity Troponin:   Recent Labs  Lab  12/06/21 2145 12/07/21 0040 12/07/21 0201 12/07/21 0851 12/08/21 0846  TROPONINIHS 373* 1,730* 3,199* 4,829* 4,110*     Chemistry Recent Labs  Lab 12/07/21 0040 12/08/21 0326 12/10/21 0246 12/11/21 0328 12/12/21 0210  NA 140   < > 138 137 139  K 4.5   < > 4.1 4.1 4.2  CL 108   < > 111 110 107  CO2 17*   < > 18* 19* 19*  GLUCOSE 289*   < > 148* 171* 173*  BUN 63*   < > 53* 48* 52*  CREATININE 3.72*   < > 2.93* 3.18* 3.29*  CALCIUM 9.0   < > 8.5* 8.6* 9.2  MG 2.0  --   --   --   --   PROT 5.5*  --   --   --   --   ALBUMIN 2.8*  --   --   --   --   AST 23  --   --   --   --   ALT 20  --   --   --   --  ALKPHOS 57  --   --   --   --   BILITOT 0.2*  --   --   --   --   GFRNONAA 16*   < > 22* 20* 19*  ANIONGAP 15   < > 9 8 13    < > = values in this interval not displayed.    Lipids  Recent Labs  Lab 12/07/21 0851  CHOL 134  TRIG 52  HDL 43  LDLCALC 81  CHOLHDL 3.1    Hematology Recent Labs  Lab 12/10/21 0246 12/11/21 0328 12/12/21 0210  WBC 8.5 7.5 7.8  RBC 3.10* 2.94* 3.02*  HGB 9.7* 9.5* 9.4*  HCT 28.7* 27.3* 28.7*  MCV 92.6 92.9 95.0  MCH 31.3 32.3 31.1  MCHC 33.8 34.8 32.8  RDW 12.2 12.1 12.2  PLT 138* 147* 143*   Thyroid  Recent Labs  Lab 12/07/21 0851  TSH 2.371    BNP Recent Labs  Lab 12/06/21 1950  BNP 509.9*    DDimer No results for input(s): "DDIMER" in the last 168 hours.   Radiology    CARDIAC CATHETERIZATION  Result Date: 12/10/2021 Conclusions: Diffuse ostial/proximal RCA disease with up to 90% stenosis, as detailed on last week's diagnostic catheterization report. Moderately elevated left ventricular filling pressure (LVEDP 30 mmHg). Successful IVUS-guided PCI using Synergy 3.5 x 32 mm drug-eluting stent (postdilated to 4.1 mm) with 0% residual stenosis and TIMI-3 flow. Recommendations: Continue dual antiplatelet therapy with aspirin and clopidogrel for at least 12 months. Aggressive secondary prevention. Avoid net even fluid  balance today; consider gentle diuresis beginning tomorrow (sooner if signs/symptoms of heart failure develop) if renal function allows. Medical therapy of left coronary artery disease noted on last week's diagnostic catheterization. Nelva Bush, MD [facility]   Cardiac Studies   Echo 12/07/2021  1. Akinesis of the inferolateral wall with moderate LV dysfunction; small  pericardial effusion with mild RV diastolic collapse; however IVC not  dilated and collapses with inspiration.   2. Left ventricular ejection fraction, by estimation, is 35 to 40%. The  left ventricle has moderately decreased function. The left ventricle  demonstrates regional wall motion abnormalities (see scoring  diagram/findings for description). There is  moderate concentric left ventricular hypertrophy. Left ventricular  diastolic parameters are consistent with Grade III diastolic dysfunction  (restrictive).   3. Right ventricular systolic function is normal. The right ventricular  size is normal.   4. A small pericardial effusion is present.   5. The mitral valve is normal in structure. No evidence of mitral valve  regurgitation. No evidence of mitral stenosis. Moderate mitral annular  calcification.   6. The aortic valve was not well visualized. Aortic valve regurgitation  is not visualized. No aortic stenosis is present.   7. The inferior vena cava is normal in size with greater than 50%  respiratory variability, suggesting right atrial pressure of 3 mmHg.   Comparison(s): No prior Echocardiogram.      Cath 12/07/2021    Mid RCA lesion is 20% stenosed.   Prox RCA lesion is 30% stenosed.   Ost RCA to Prox RCA lesion is 90% stenosed.   3rd RPL lesion is 60% stenosed.   Prox Cx lesion is 40% stenosed.   1st Mrg lesion is 99% stenosed.   Prox Cx to Mid Cx lesion is 70% stenosed.   Mid LAD lesion is 10% stenosed.   The LAD is a large caliber vessel that courses to the apex. The mid LAD stented  segment is patent with minimal restenosis.  2.   The Circumflex is a moderate caliber vessel with two small obtuse marginal branches. The proximal Circumflex has mild to moderate stenosis. The mid Circumflex has moderate stenosis just beyond the obtuse marginal branch. The first obtuse marginal branch has sub-total occlusion but fills from left to left collaterals.  3.  The RCA is a large dominant artery with severe ostial stenosis. The mid vessel stented segment is patent. Moderate disease in the left posterolateral artery.  4.  LVEDP 21 mmHg 5.  45 cc contrast dye used    Recommendations: We could consider staged PCI of the ostial RCA. There is at least mild calcification of this lesion but PCI is an option. I would treat the disease in his Circumflex medically. Given his advanced kidney disease and his refusal to consider dialysis if it were necessary, we may opt to treat his CAD medically for now. Resume IV heparin 2 hours post radial sheath removal.   Diagnostic Dominance: Right      Cath 12/10/2021  Conclusions: Diffuse ostial/proximal RCA disease with up to 90% stenosis, as detailed on last week's diagnostic catheterization report. Moderately elevated left ventricular filling pressure (LVEDP 30 mmHg). Successful IVUS-guided PCI using Synergy 3.5 x 32 mm drug-eluting stent (postdilated to 4.1 mm) with 0% residual stenosis and TIMI-3 flow.   Recommendations: Continue dual antiplatelet therapy with aspirin and clopidogrel for at least 12 months. Aggressive secondary prevention. Avoid net even fluid balance today; consider gentle diuresis beginning tomorrow (sooner if signs/symptoms of heart failure develop) if renal function allows. Medical therapy of left coronary artery disease noted on last week's diagnostic catheterization.   LV end diastolic pressure is moderately elevated. LVEDP 30 mmHg.    Diagnostic Dominance: Right  Intervention    Patient Profile     71 y.o. 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   Assessment & Plan    NSTEMI -- High-sensitivity troponin peaked at 4829.  Underwent cardiac catheterization 12/1 with 30% prox RCA, 20% mid RCA, 90% ost RCA recommend staged PCI, 99% OM1 with L to L collaterals, 70% prox to mid LCx.  It was recommended to consider staged PCI of the RCA with medical therapy of the circumflex due to his renal function.  -- Underwent catheterization 12/4 with IVUS guided PCI of ostial/proximal RCA lesion with DES x1.  Recommendations for DAPT with aspirin/Plavix for at least 1 year  CAP -- Per primary  ICM HFrEF -- Echocardiogram 12/1 with LVEF of 35 to 40%, akinesis of inferior lateral wall with moderate LV dysfunction, small pericardial effusion, moderate LVH and grade 3 diastolic dysfunction -- LVEDP was 30 mmHg on cath 12/4, but diuresis has been held for rising creatinine -- GDMT limited with renal function: Continue Coreg, suspect he will need daily diuretic at discharge, but will rising Cr will hold an additional day  HTN -- borderline controlled -- continue coreg 25mg  BID, increase hydralazine 50 mg BID  DM -- per primary -- no room for SGLT2 with GFR 19  CKD stage IIIb/IV -- Cr 2.93>>3.18>>3.29 hopefully beginning to plateau at this point. Suspect he will need an additional day of monitoring prior to initiation of diuretic  -- follows with nephrologist  -- BMET in am   For questions or updates, please  contact [place] Please consult [email or web address] for contact info under        Signed, Reino Bellis, NP  12/12/2021, 8:53 AM    Patient seen, examined. Available data reviewed. Agree with findings, assessment, and plan as outlined by Reino Bellis, NP.  The patient is independently interviewed and  examined.  He is alert, oriented, no distress.  Lungs are clear bilaterally, heart is regular rate and rhythm no murmur gallop, abdomen soft nontender, extremities have no edema, skin is warm dry with no rash.  The patient feels well and is eager for hospital discharge.  I think he is medically stable.  His creatinine has trended up slightly but essentially has stabilized.  I think he could be discharged today with follow-up labs performed within the next week to closely monitor him.  I suspect he will need a daily loop diuretic but would wait on starting this until he has his follow-up labs done.  Otherwise as above, continue DAPT for at least 1 year using aspirin and clopidogrel.  Will arrange cardiology follow-up.  Sherren Mocha, M.D. 12/12/2021 11:56 AM

## 2021-12-12 NOTE — Progress Notes (Signed)
   Heart Failure Stewardship Pharmacist Progress Note   PCP: Dettinger, Fransisca Kaufmann, MD PCP-Cardiologist: None    HPI:  71 yo M with PMH of CKD IIIb, T2DM, anemia, CHF, and HTN.   He presented to the ED on 11/30 with shortness of breath. CXR with R bibasilar PNA. ECHO 12/1 with LVEF 35-40%, akinesis of the inferolateral wall, regional wall motion abnormalities, G3DD, RV normal, small pericardial effusion. Taken for [facility] on 12/1 and found to have multivessel CAD with patent prior stents, but new lesion in RCA. Post cath chest discomfort. Staged PCI to RCA done on 12/4, LVEDP 30.  Current HF Medications: Beta Blocker: carvedilol 25 mg BID Other: hydralazine 25 mg BID  Prior to admission HF Medications: Beta blocker: carvedilol 25 mg BID  Pertinent Lab Values: Serum creatinine 3.29, BUN 52, Potassium 4.2, Sodium 139, BNP 509.9, Magnesium 2.0, A1c 6.8  Vital Signs: Weight: 189 lbs (admission weight: 194 lbs) Blood pressure: 140/70s  Heart rate: 70-80s  I/O: not documented yesterday; net -3.7L  Medication Assistance / Insurance Benefits Check: Does the patient have prescription insurance?  Yes Type of insurance plan: [facility] Medicare  Outpatient Pharmacy:  Prior to admission outpatient pharmacy: [facility] Is the patient willing to use [facility] at discharge? Yes Is the patient willing to transition their outpatient pharmacy to utilize a [facility] outpatient pharmacy?   Pending    Assessment: 1. Chronic systolic CHF (LVEF 40-10%), due to ICM. NYHA class II symptoms. - Not volume overloaded on exam, LVEDP elevated on cath. Will likely need diuretic on discharge. - Continue carvedilol 25 mg BID - No ACE/ARB/ARNI, MRA, or SGLT2i with advanced CKD. eGFR 22. - He takes tadalafil regularly at home. Caution nitrates. - Increasing to hydralazine 50 mg BID,  may need to increase to TID   Plan: 1) Medication changes recommended at this time: - Increase hydralazine to TID  tomorrow if BP remains elevated  2) Patient assistance: - None pending  3)  Education  - Patient has been educated on current HF medications and potential additions to HF medication regimen - Patient verbalizes understanding that over the next few months, these medication doses may change and more medications may be added to optimize HF regimen - Patient has been educated on basic disease state pathophysiology and goals of therapy   Kerby Nora, PharmD, BCPS Heart Failure Stewardship Pharmacist Phone [phone]

## 2021-12-13 ENCOUNTER — Other Ambulatory Visit (HOSPITAL_COMMUNITY): Payer: Self-pay

## 2021-12-13 LAB — BASIC METABOLIC PANEL
Anion gap: 10 (ref 5–15)
BUN: 58 mg/dL — ABNORMAL HIGH (ref 8–23)
CO2: 19 mmol/L — ABNORMAL LOW (ref 22–32)
Calcium: 8.6 mg/dL — ABNORMAL LOW (ref 8.9–10.3)
Chloride: 108 mmol/L (ref 98–111)
Creatinine, Ser: 3.32 mg/dL — ABNORMAL HIGH (ref 0.61–1.24)
GFR, Estimated: 19 mL/min — ABNORMAL LOW (ref >60)
Glucose, Bld: 203 mg/dL — ABNORMAL HIGH (ref 70–99)
Potassium: 4.3 mmol/L (ref 3.5–5.1)
Sodium: 137 mmol/L (ref 135–145)

## 2021-12-13 LAB — CBC
HCT: 28.2 % — ABNORMAL LOW (ref 39.0–52.0)
Hemoglobin: 9.1 g/dL — ABNORMAL LOW (ref 13.0–17.0)
MCH: 30.3 pg (ref 26.0–34.0)
MCHC: 32.3 g/dL (ref 30.0–36.0)
MCV: 94 fL (ref 80.0–100.0)
Platelets: 154 10*3/uL (ref 150–400)
RBC: 3 MIL/uL — ABNORMAL LOW (ref 4.22–5.81)
RDW: 12 % (ref 11.5–15.5)
WBC: 9.5 10*3/uL (ref 4.0–10.5)
nRBC: 0 % (ref 0.0–0.2)

## 2021-12-13 MED ORDER — CLOPIDOGREL BISULFATE 75 MG PO TABS
75.0000 mg | ORAL_TABLET | Freq: Every day | ORAL | 3 refills | Status: AC
  Filled 2021-12-13: qty 30, 30d supply, fill #0

## 2021-12-13 MED ORDER — HYDRALAZINE HCL 50 MG PO TABS
50.0000 mg | ORAL_TABLET | Freq: Three times a day (TID) | ORAL | 0 refills | Status: AC
  Filled 2021-12-13: qty 90, 30d supply, fill #0

## 2021-12-13 MED ORDER — AMLODIPINE BESYLATE 10 MG PO TABS
10.0000 mg | ORAL_TABLET | Freq: Every day | ORAL | 0 refills | Status: AC
  Filled 2021-12-13: qty 30, 30d supply, fill #0

## 2021-12-13 MED ORDER — GLIPIZIDE ER 10 MG PO TB24: 10.0000 mg | ORAL_TABLET | Freq: Every day | ORAL | Status: AC

## 2021-12-13 MED ORDER — HYDRALAZINE HCL 50 MG PO TABS
50.0000 mg | ORAL_TABLET | Freq: Three times a day (TID) | ORAL | Status: DC
  Administered 2021-12-13: 50 mg via ORAL
  Filled 2021-12-13: qty 1

## 2021-12-13 MED ORDER — FUROSEMIDE 40 MG PO TABS
40.0000 mg | ORAL_TABLET | Freq: Every day | ORAL | 0 refills | Status: AC
  Filled 2021-12-13: qty 30, 30d supply, fill #0

## 2021-12-13 NOTE — Discharge Summary (Addendum)
Physician Discharge Summary  Austin Weber CWC:[id number] DOB: 10-25-1950 DOA: 12/06/2021  PCP: Dettinger, Fransisca Kaufmann, MD  Admit date: 12/06/2021 Discharge date: 12/13/2021  Time spent: 35 minutes  Recommendations for Outpatient Follow-up:  BMP on Monday 12/11, please titrate meds/diuretics as needed Nephrology Dr. Theador Hawthorne in 2 weeks Cardiology Dr. Gwenlyn Found or APP in 1 to 2 weeks   Discharge Diagnoses:  Principal Problem:   Non-ST elevation (NSTEMI) myocardial infarction [facility])   Acute on chronic systolic heart failure ([place])   Acute kidney injury superimposed on chronic kidney disease 4 ([place])   Essential hypertension   Type 2 diabetes mellitus with hyperlipidemia (HCC)   Cigarette nicotine dependence without complication   Discharge Condition: improved  Diet recommendation: DM heart healthy  Filed Weights   12/09/21 0510 12/10/21 0453 12/11/21 0650  Weight: 86 kg 86 kg 85.9 kg    History of present illness:  71 y.o. male with PMH of CAD ( inferior MI s/p stent RCA and LAD), ICM/HFrEF, HTN, HLD, CKD stage IIIb-IV, proteinuria, DM and tobacco abuse who presented on 12/07/2021 with chest pain, elevated troponin and new LBBB.   -underwent Staged PCI of Ostial to Prox RCA lesion -Rx w/ DES 12/4, LVEDP 30  Hospital Course:  NSTEMI CAD -EKG with new left bundle branch block, echocardiogram with positive well motion abnormalities.  -Cards following -cath 12/07/2021 30% prox RCA, 20% mid RCA, 90% ost RCA recommend staged PCI, 99% OM1 with L to L collaterals, 70% prox to mid LCx recommend medical therapy -Repeat cath 12/10/2021 90% ost to prox RCA lesion treated with DES. LVEDP 30 mmHg -Continue ASA/Plavix/Coreg/statin   Acute systolic heart failure (HCC) Echo w/ EF 35 to 40%, moderate LVH, RV systolic function preserved. Akinesis of the inferolateral wall with moderate LV dysfunction. -Left ventricle end diastolic pressure 30 mmHg.  -GDMT limited by CKD, continue coreg -lasix on hold  w/ AKI, will need to be resumed at discharge   AKi on CKD4 -baseline creat 2.2, peaked at 4.2 -given IVF pre-cath, ARB stopped Now creatinine has plateaued around 3.2-3.3, follows with Dr. Theador Hawthorne in [place], Lasix to be resumed in 2 days,, repeat labs on Monday 12/11, advised to call Dr.Bhutani's office today or tomorrow for a quick follow-up   Essential hypertension -Continue amlodipine and carvedilol. -Stable   Type 2 diabetes mellitus with hyperlipidemia (HCC) -stable, glipizide dose decreased to daily   Cigarette nicotine dependence without complication Smoking cessation counseled   Consultants: Cards     Procedures: Cath 12/10/2021 Conclusions: Diffuse ostial/proximal RCA disease with up to 90% stenosis, as detailed on last week's diagnostic catheterization report. Moderately elevated left ventricular filling pressure (LVEDP 30 mmHg). Successful IVUS-guided PCI using Synergy 3.5 x 32 mm drug-eluting stent (postdilated to 4.1 mm) with 0% residual stenosis and TIMI-3 flow.   Recommendations: Continue dual antiplatelet therapy with aspirin and clopidogrel for at least 12 months. Aggressive secondary prevention. Avoid net even fluid balance today; consider gentle diuresis beginning tomorrow (sooner if signs/symptoms of heart failure develop) if renal function allows. Medical therapy of left coronary artery disease noted on last week's diagnostic catheterization.    Discharge Exam: Vitals:   12/13/21 0501 12/13/21 0926  BP: (!) 141/58 (!) 151/58  Pulse: 83 80  Resp: 16 17  Temp: 98.5 F (36.9 C) 98 F (36.7 C)  SpO2:  99%    Gen: Awake, Alert, Oriented X 3,  HEENT: no JVD Lungs: Scant basilar Rales CVS: S1S2/RRR Abd: soft, Non tender, non distended, BS present Extremities:  No edema Skin: no new rashes on exposed skin   Discharge Instructions   Discharge Instructions     Amb Referral to Cardiac Rehabilitation   Complete by: As directed    Diagnosis:   Coronary Stents NSTEMI     After initial evaluation and assessments completed: Virtual Based Care may be provided alone or in conjunction with Phase 2 Cardiac Rehab based on patient barriers.: Yes   Intensive Cardiac Rehabilitation (ICR) [place] location only OR Traditional Cardiac Rehabilitation (TCR) *If criteria for ICR are not met will enroll in TCR [facility] only): Yes   Diet - low sodium heart healthy   Complete by: As directed    Increase activity slowly   Complete by: As directed       Allergies as of 12/13/2021       Reactions   Nitroglycerin    NOT an allergy - just FYI patient takes tadalafil regularly so please review last dose        Medication List     STOP taking these medications    losartan 100 MG tablet Commonly known as: COZAAR   terazosin 2 MG capsule Commonly known as: HYTRIN       TAKE these medications    amLODipine 10 MG tablet Commonly known as: NORVASC Take 1 tablet (10 mg total) by mouth daily.   aspirin EC 81 MG tablet Take 81 mg by mouth 2 (two) times daily.   atorvastatin 20 MG tablet Commonly known as: LIPITOR Take 1 tablet (20 mg total) by mouth daily.   carvedilol 25 MG tablet Commonly known as: COREG Take 25 mg by mouth 2 (two) times daily.   clopidogrel 75 MG tablet Commonly known as: PLAVIX Take 1 tablet (75 mg total) by mouth daily.   fluticasone 50 MCG/ACT nasal spray Commonly known as: FLONASE Place 1 spray into both nostrils 2 (two) times daily as needed for allergies or rhinitis.   furosemide 40 MG tablet Commonly known as: Lasix Take 1 tablet (40 mg total) by mouth daily. Starting on Saturday 12/9 Start taking on: December 15, 2021   glipiZIDE 10 MG 24 hr tablet Commonly known as: GLUCOTROL XL Take 1 tablet (10 mg total) by mouth daily with breakfast. What changed: when to take this   hydrALAZINE 50 MG tablet Commonly known as: APRESOLINE Take 1 tablet (50 mg total) by mouth 3 (three) times daily.   OneTouch  Delica Lancets 36U Misc TWICE DAILY   OneTouch Verio Flex System w/Device Kit USE AS DIRECTED   OneTouch Verio test strip Generic drug: glucose blood TWICE DAILY   repaglinide 2 MG tablet Commonly known as: PRANDIN Take 2 mg by mouth 3 (three) times daily.   tadalafil 5 MG tablet Commonly known as: CIALIS TAKE ONE (1) TABLET EACH DAY What changed: See the new instructions.       Allergies  Allergen Reactions   Nitroglycerin     NOT an allergy - just FYI patient takes tadalafil regularly so please review last dose    Follow-up Information     [place]. Go in 11 day(s).   Specialty: Cardiology Why: Hospital follow up PLEASE bring a current medication list  FREE valet parking Entrance C, off [employer] information: [address] [id number] [place]        Arrien, Jimmy Picket, MD .   Specialty: Internal Medicine Contact information: [place]  [address] [phone]                  The results of significant diagnostics from this hospitalization (including imaging, microbiology, ancillary and laboratory) are listed below for reference.    Significant Diagnostic Studies: CARDIAC CATHETERIZATION  Result Date: 12/10/2021 Conclusions: Diffuse ostial/proximal RCA disease with up to 90% stenosis, as detailed on last week's diagnostic catheterization report. Moderately elevated left ventricular filling pressure (LVEDP 30 mmHg). Successful IVUS-guided PCI using Synergy 3.5 x 32 mm drug-eluting stent (postdilated to 4.1 mm) with 0% residual stenosis and TIMI-3 flow. Recommendations: Continue dual antiplatelet therapy with aspirin and clopidogrel for at least 12 months. Aggressive secondary prevention. Avoid net even fluid balance today; consider gentle diuresis beginning tomorrow (sooner if signs/symptoms of heart failure develop)  if renal function allows. Medical therapy of left coronary artery disease noted on last week's diagnostic catheterization. Nelva Bush, MD [facility]  CARDIAC CATHETERIZATION  Result Date: 12/07/2021   Mid RCA lesion is 20% stenosed.   Prox RCA lesion is 30% stenosed.   Ost RCA to Prox RCA lesion is 90% stenosed.   3rd RPL lesion is 60% stenosed.   Prox Cx lesion is 40% stenosed.   1st Mrg lesion is 99% stenosed.   Prox Cx to Mid Cx lesion is 70% stenosed.   Mid LAD lesion is 10% stenosed. The LAD is a large caliber vessel that courses to the apex. The mid LAD stented segment is patent with minimal restenosis. 2.   The Circumflex is a moderate caliber vessel with two small obtuse marginal branches. The proximal Circumflex has mild to moderate stenosis. The mid Circumflex has moderate stenosis just beyond the obtuse marginal branch. The first obtuse marginal branch has sub-total occlusion but fills from left to left collaterals. 3.  The RCA is a large dominant artery with severe ostial stenosis. The mid vessel stented segment is patent. Moderate disease in the left posterolateral artery. 4.  LVEDP 21 mmHg 5.  45 cc contrast dye used Recommendations: We could consider staged PCI of the ostial RCA. There is at least mild calcification of this lesion but PCI is an option. I would treat the disease in his Circumflex medically. Given his advanced kidney disease and his refusal to consider dialysis if it were necessary, we may opt to treat his CAD medically for now. Resume IV heparin 2 hours post radial sheath removal.   ECHOCARDIOGRAM COMPLETE  Result Date: 12/07/2021    ECHOCARDIOGRAM REPORT   Patient Name:   Austin Weber Date of Exam: 12/07/2021 Medical Rec #:  [id number]      Height:       73.0 in Accession #:    [id number]     Weight:       204.0 lb Date of Birth:  Nov 06, 1950      BSA:          2.170 m Patient Age:    71 years       BP:           167/79 mmHg Patient Gender: M              HR:            99 bpm. Exam Location:  Inpatient Procedure: 2D Echo, Cardiac Doppler and Color Doppler Indications:    NSTEMI  History:        Patient has no prior history of Echocardiogram examinations.  Risk Factors:Diabetes, Hypertension, Dyslipidemia and Current                 Smoker. CKD.  Sonographer:    Clayton Lefort RDCS (AE) Referring Phys: [place]  1. Akinesis of the inferolateral wall with moderate LV dysfunction; small pericardial effusion with mild RV diastolic collapse; however IVC not dilated and collapses with inspiration.  2. Left ventricular ejection fraction, by estimation, is 35 to 40%. The left ventricle has moderately decreased function. The left ventricle demonstrates regional wall motion abnormalities (see scoring diagram/findings for description). There is moderate concentric left ventricular hypertrophy. Left ventricular diastolic parameters are consistent with Grade III diastolic dysfunction (restrictive).  3. Right ventricular systolic function is normal. The right ventricular size is normal.  4. A small pericardial effusion is present.  5. The mitral valve is normal in structure. No evidence of mitral valve regurgitation. No evidence of mitral stenosis. Moderate mitral annular calcification.  6. The aortic valve was not well visualized. Aortic valve regurgitation is not visualized. No aortic stenosis is present.  7. The inferior vena cava is normal in size with greater than 50% respiratory variability, suggesting right atrial pressure of 3 mmHg. Comparison(s): No prior Echocardiogram. FINDINGS  Left Ventricle: Left ventricular ejection fraction, by estimation, is 35 to 40%. The left ventricle has moderately decreased function. The left ventricle demonstrates regional wall motion abnormalities. The left ventricular internal cavity size was normal in size. There is moderate concentric left ventricular hypertrophy. Left ventricular diastolic parameters are consistent  with Grade III diastolic dysfunction (restrictive). Right Ventricle: The right ventricular size is normal. Right ventricular systolic function is normal. Left Atrium: Left atrial size was normal in size. Right Atrium: Right atrial size was normal in size. Pericardium: A small pericardial effusion is present. Mitral Valve: The mitral valve is normal in structure. Moderate mitral annular calcification. No evidence of mitral valve regurgitation. No evidence of mitral valve stenosis. MV peak gradient, 11.2 mmHg. The mean mitral valve gradient is 5.0 mmHg. Tricuspid Valve: The tricuspid valve is normal in structure. Tricuspid valve regurgitation is trivial. No evidence of tricuspid stenosis. Aortic Valve: The aortic valve was not well visualized. Aortic valve regurgitation is not visualized. No aortic stenosis is present. Aortic valve mean gradient measures 4.0 mmHg. Aortic valve peak gradient measures 7.1 mmHg. Aortic valve area, by VTI measures 2.47 cm. Pulmonic Valve: The pulmonic valve was not well visualized. Pulmonic valve regurgitation is not visualized. No evidence of pulmonic stenosis. Aorta: The aortic root is normal in size and structure and the ascending aorta was not well visualized. Venous: The inferior vena cava is normal in size with greater than 50% respiratory variability, suggesting right atrial pressure of 3 mmHg. IAS/Shunts: The interatrial septum was not well visualized. Additional Comments: Akinesis of the inferolateral wall with moderate LV dysfunction; small pericardial effusion with mild RV diastolic collapse; however IVC not dilated and collapses with inspiration.  LEFT VENTRICLE PLAX 2D LVIDd:         4.80 cm LVIDs:         3.30 cm LV PW:         1.50 cm LV IVS:        1.20 cm LVOT diam:     2.10 cm LV SV:         67 LV SV Index:   31 LVOT Area:     3.46 cm  LV Volumes (MOD) LV vol d, MOD A2C: 127.0 ml LV vol d,  MOD A4C: 95.7 ml LV vol s, MOD A2C: 79.0 ml LV vol s, MOD A4C: 70.1 ml LV SV MOD  A2C:     48.0 ml LV SV MOD A4C:     95.7 ml LV SV MOD BP:      37.8 ml RIGHT VENTRICLE RV Basal diam:  3.00 cm RV S prime:     16.40 cm/s TAPSE (M-mode): 2.9 cm LEFT ATRIUM             Index        RIGHT ATRIUM           Index LA diam:        4.00 cm 1.84 cm/m   RA Area:     13.40 cm LA Vol (A2C):   75.8 ml 34.93 ml/m  RA Volume:   31.60 ml  14.56 ml/m LA Vol (A4C):   57.6 ml 26.55 ml/m LA Biplane Vol: 70.9 ml 32.68 ml/m  AORTIC VALVE AV Area (Vmax):    2.92 cm AV Area (Vmean):   2.93 cm AV Area (VTI):     2.47 cm AV Vmax:           133.00 cm/s AV Vmean:          92.700 cm/s AV VTI:            0.269 m AV Peak Grad:      7.1 mmHg AV Mean Grad:      4.0 mmHg LVOT Vmax:         112.00 cm/s LVOT Vmean:        78.300 cm/s LVOT VTI:          0.192 m LVOT/AV VTI ratio: 0.71  AORTA Ao Root diam: 3.10 cm MITRAL VALVE MV Area VTI:  2.62 cm    SHUNTS MV Peak grad: 11.2 mmHg   Systemic VTI:  0.19 m MV Mean grad: 5.0 mmHg    Systemic Diam: 2.10 cm MV Vmax:      1.67 m/s MV Vmean:     101.0 cm/s Kirk Ruths MD Electronically signed by Kirk Ruths MD Signature Date/Time: 12/07/2021/8:50:48 AM    Final    DG Chest Portable 1 View  Result Date: 12/06/2021 CLINICAL DATA:  Chest pain and shortness of breath EXAM: PORTABLE CHEST 1 VIEW COMPARISON:  None FINDINGS: Cardiac shadow is at the upper limits of normal in size. Diffuse right basilar infiltrate is noted consistent with pneumonia. No sizable effusion is seen. No bony abnormality is noted. IMPRESSION: Right basilar pneumonia Electronically Signed   By: Inez Catalina M.D.   On: 12/06/2021 20:29    Microbiology: Recent Results (from the past 240 hour(s))  Resp Panel by RT-PCR (Flu A&B, Covid) Anterior Nasal Swab     Status: None   Collection Time: 12/06/21  8:47 PM   Specimen: Anterior Nasal Swab  Result Value Ref Range Status   SARS Coronavirus 2 by RT PCR NEGATIVE NEGATIVE Final    Comment: (NOTE) SARS-CoV-2 target nucleic acids are NOT  DETECTED.  The SARS-CoV-2 RNA is generally detectable in upper respiratory specimens during the acute phase of infection. The lowest concentration of SARS-CoV-2 viral copies this assay can detect is 138 copies/mL. A negative result does not preclude SARS-Cov-2 infection and should not be used as the sole basis for treatment or other patient management decisions. A negative result may occur with  improper specimen collection/handling, submission of specimen other than nasopharyngeal swab, presence of viral mutation(s) within the areas targeted  by this assay, and inadequate number of viral copies(<138 copies/mL). A negative result must be combined with clinical observations, patient history, and epidemiological information. The expected result is Negative.  Fact Sheet for Patients:  EntrepreneurPulse.com.au  Fact Sheet for Healthcare Providers:  IncredibleEmployment.be  This test is no t yet approved or cleared by the [place] FDA and  has been authorized for detection and/or diagnosis of SARS-CoV-2 by FDA under an Emergency Use Authorization (EUA). This EUA will remain  in effect (meaning this test can be used) for the duration of the COVID-19 declaration under Section 564(b)(1) of the Act, 21 U.S.C.section 360bbb-3(b)(1), unless the authorization is terminated  or revoked sooner.       Influenza A by PCR NEGATIVE NEGATIVE Final   Influenza B by PCR NEGATIVE NEGATIVE Final    Comment: (NOTE) The Xpert Xpress SARS-CoV-2/FLU/RSV plus assay is intended as an aid in the diagnosis of influenza from Nasopharyngeal swab specimens and should not be used as a sole basis for treatment. Nasal washings and aspirates are unacceptable for Xpert Xpress SARS-CoV-2/FLU/RSV testing.  Fact Sheet for Patients: EntrepreneurPulse.com.au  Fact Sheet for Healthcare Providers: IncredibleEmployment.be  This test is not yet  approved or cleared by the [place] FDA and has been authorized for detection and/or diagnosis of SARS-CoV-2 by FDA under an Emergency Use Authorization (EUA). This EUA will remain in effect (meaning this test can be used) for the duration of the COVID-19 declaration under Section 564(b)(1) of the Act, 21 U.S.C. section 360bbb-3(b)(1), unless the authorization is terminated or revoked.  Performed at [facility], [address], [place], [place] [address]      Labs: Basic Metabolic Panel: Recent Labs  Lab 12/07/21 0040 12/08/21 0326 12/09/21 0122 12/10/21 0246 12/11/21 0328 12/12/21 0210 12/13/21 0244  NA 140   < > 140 138 137 139 137  K 4.5   < > 4.1 4.1 4.1 4.2 4.3  CL 108   < > 112* 111 110 107 108  CO2 17*   < > 18* 18* 19* 19* 19*  GLUCOSE 289*   < > 110* 148* 171* 173* 203*  BUN 63*   < > 52* 53* 48* 52* 58*  CREATININE 3.72*   < > 3.25* 2.93* 3.18* 3.29* 3.32*  CALCIUM 9.0   < > 9.0 8.5* 8.6* 9.2 8.6*  MG 2.0  --   --   --   --   --   --   PHOS 5.3*  --   --   --   --   --   --    < > = values in this interval not displayed.   Liver Function Tests: Recent Labs  Lab 12/07/21 0040  AST 23  ALT 20  ALKPHOS 57  BILITOT 0.2*  PROT 5.5*  ALBUMIN 2.8*   No results for input(s): "LIPASE", "AMYLASE" in the last 168 hours. No results for input(s): "AMMONIA" in the last 168 hours. CBC: Recent Labs  Lab 12/07/21 0040 12/07/21 0851 12/09/21 0122 12/10/21 0246 12/11/21 0328 12/12/21 0210 12/13/21 0244  WBC 9.4   < > 8.5 8.5 7.5 7.8 9.5  NEUTROABS 7.8*  --   --   --   --   --   --   HGB 10.7*   < > 10.2* 9.7* 9.5* 9.4* 9.1*  HCT 33.5*   < > 30.8* 28.7* 27.3* 28.7* 28.2*  MCV 97.1   < > 95.1 92.6 92.9 95.0 94.0  PLT 130*   < >  147* 138* 147* 143* 154   < > = values in this interval not displayed.   Cardiac Enzymes: No results for input(s): "CKTOTAL", "CKMB", "CKMBINDEX", "TROPONINI" in the last 168 hours. BNP: BNP (last 3 results) Recent Labs     12/06/21 1950  BNP 509.9*    ProBNP (last 3 results) No results for input(s): "PROBNP" in the last 8760 hours.  CBG: Recent Labs  Lab 12/08/21 1611 12/08/21 2049 12/09/21 0512 12/09/21 0800 12/09/21 1123  GLUCAP 151* 137* 120* 127* 153*       Signed:  Domenic Polite MD.  [facility] 12/13/2021, 12:41 PM

## 2021-12-13 NOTE — Progress Notes (Addendum)
Rounding Note    Patient Name: Austin Weber Date of Encounter: 12/13/2021  [place] Cardiologist: Quay Burow, MD  (wants to follow in [place] office)  Subjective   No complaints, wants to go home.   Inpatient Medications    Scheduled Meds:  amLODipine  10 mg Oral Daily   aspirin  81 mg Oral Daily   atorvastatin  80 mg Oral Daily   carvedilol  25 mg Oral BID WC   cefdinir  300 mg Oral Daily   clopidogrel  75 mg Oral Daily   heparin  5,000 Units Subcutaneous Q8H   hydrALAZINE  50 mg Oral BID   pantoprazole  40 mg Oral Daily   sodium chloride flush  3 mL Intravenous Q12H   sodium chloride flush  3 mL Intravenous Q12H   Continuous Infusions:  sodium chloride     PRN Meds: sodium chloride, HYDROmorphone (DILAUDID) injection, ipratropium-albuterol, oxyCODONE, sodium chloride flush   Vital Signs    Vitals:   12/12/21 1622 12/12/21 1819 12/12/21 2029 12/13/21 0501  BP: (!) 152/68 (!) 143/56 (!) 143/58 (!) 141/58  Pulse: 74 82 74 83  Resp: 16  16 16   Temp: 98.1 F (36.7 C)  98 F (36.7 C) 98.5 F (36.9 C)  TempSrc: Oral  Oral Oral  SpO2: 98%     Weight:      Height:       No intake or output data in the 24 hours ending 12/13/21 0816    12/11/2021    6:50 AM 12/10/2021    4:53 AM 12/09/2021    5:10 AM  Last 3 Weights  Weight (lbs) 189 lb 6 oz 189 lb 11.2 oz 189 lb 9.5 oz  Weight (kg) 85.9 kg 86.047 kg 86 kg      Telemetry    Sinus Rhythm - Personally Reviewed  ECG    No new tracing this morning  Physical Exam   GEN: No acute distress.   Neck: No JVD Cardiac: RRR, no murmurs, rubs, or gallops.  Respiratory: Clear to auscultation bilaterally. GI: Soft, nontender, non-distended  MS: No edema; No deformity. Neuro:  Nonfocal  Psych: Normal affect   Labs    High Sensitivity Troponin:   Recent Labs  Lab 12/06/21 2145 12/07/21 0040 12/07/21 0201 12/07/21 0851 12/08/21 0846  TROPONINIHS 373* 1,730* 3,199* 4,829* 4,110*      Chemistry Recent Labs  Lab 12/07/21 0040 12/08/21 0326 12/11/21 0328 12/12/21 0210 12/13/21 0244  NA 140   < > 137 139 137  K 4.5   < > 4.1 4.2 4.3  CL 108   < > 110 107 108  CO2 17*   < > 19* 19* 19*  GLUCOSE 289*   < > 171* 173* 203*  BUN 63*   < > 48* 52* 58*  CREATININE 3.72*   < > 3.18* 3.29* 3.32*  CALCIUM 9.0   < > 8.6* 9.2 8.6*  MG 2.0  --   --   --   --   PROT 5.5*  --   --   --   --   ALBUMIN 2.8*  --   --   --   --   AST 23  --   --   --   --   ALT 20  --   --   --   --   ALKPHOS 57  --   --   --   --   BILITOT 0.2*  --   --   --   --  GFRNONAA 16*   < > 20* 19* 19*  ANIONGAP 15   < > 8 13 10    < > = values in this interval not displayed.    Lipids  Recent Labs  Lab 12/07/21 0851  CHOL 134  TRIG 52  HDL 43  LDLCALC 81  CHOLHDL 3.1    Hematology Recent Labs  Lab 12/11/21 0328 12/12/21 0210 12/13/21 0244  WBC 7.5 7.8 9.5  RBC 2.94* 3.02* 3.00*  HGB 9.5* 9.4* 9.1*  HCT 27.3* 28.7* 28.2*  MCV 92.9 95.0 94.0  MCH 32.3 31.1 30.3  MCHC 34.8 32.8 32.3  RDW 12.1 12.2 12.0  PLT 147* 143* 154   Thyroid  Recent Labs  Lab 12/07/21 0851  TSH 2.371    BNP Recent Labs  Lab 12/06/21 1950  BNP 509.9*    DDimer No results for input(s): "DDIMER" in the last 168 hours.   Radiology    No results found.  Cardiac Studies   Echo 12/07/2021   1. Akinesis of the inferolateral wall with moderate LV dysfunction; small  pericardial effusion with mild RV diastolic collapse; however IVC not  dilated and collapses with inspiration.   2. Left ventricular ejection fraction, by estimation, is 35 to 40%. The  left ventricle has moderately decreased function. The left ventricle  demonstrates regional wall motion abnormalities (see scoring  diagram/findings for description). There is  moderate concentric left ventricular hypertrophy. Left ventricular  diastolic parameters are consistent with Grade III diastolic dysfunction  (restrictive).   3. Right  ventricular systolic function is normal. The right ventricular  size is normal.   4. A small pericardial effusion is present.   5. The mitral valve is normal in structure. No evidence of mitral valve  regurgitation. No evidence of mitral stenosis. Moderate mitral annular  calcification.   6. The aortic valve was not well visualized. Aortic valve regurgitation  is not visualized. No aortic stenosis is present.   7. The inferior vena cava is normal in size with greater than 50%  respiratory variability, suggesting right atrial pressure of 3 mmHg.   Comparison(s): No prior Echocardiogram.      Cath 12/07/2021     Mid RCA lesion is 20% stenosed.   Prox RCA lesion is 30% stenosed.   Ost RCA to Prox RCA lesion is 90% stenosed.   3rd RPL lesion is 60% stenosed.   Prox Cx lesion is 40% stenosed.   1st Mrg lesion is 99% stenosed.   Prox Cx to Mid Cx lesion is 70% stenosed.   Mid LAD lesion is 10% stenosed.   The LAD is a large caliber vessel that courses to the apex. The mid LAD stented segment is patent with minimal restenosis.  2.   The Circumflex is a moderate caliber vessel with two small obtuse marginal branches. The proximal Circumflex has mild to moderate stenosis. The mid Circumflex has moderate stenosis just beyond the obtuse marginal branch. The first obtuse marginal branch has sub-total occlusion but fills from left to left collaterals.  3.  The RCA is a large dominant artery with severe ostial stenosis. The mid vessel stented segment is patent. Moderate disease in the left posterolateral artery.  4.  LVEDP 21 mmHg 5.  45 cc contrast dye used    Recommendations: We could consider staged PCI of the ostial RCA. There is at least mild calcification of this lesion but PCI is an option. I would treat the disease in his Circumflex medically. Given his advanced  kidney disease and his refusal to consider dialysis if it were necessary, we may opt to treat his CAD medically for now. Resume IV  heparin 2 hours post radial sheath removal.    Diagnostic Dominance: Right      Cath 12/10/2021   Conclusions: Diffuse ostial/proximal RCA disease with up to 90% stenosis, as detailed on last week's diagnostic catheterization report. Moderately elevated left ventricular filling pressure (LVEDP 30 mmHg). Successful IVUS-guided PCI using Synergy 3.5 x 32 mm drug-eluting stent (postdilated to 4.1 mm) with 0% residual stenosis and TIMI-3 flow.   Recommendations: Continue dual antiplatelet therapy with aspirin and clopidogrel for at least 12 months. Aggressive secondary prevention. Avoid net even fluid balance today; consider gentle diuresis beginning tomorrow (sooner if signs/symptoms of heart failure develop) if renal function allows. Medical therapy of left coronary artery disease noted on last week's diagnostic catheterization.   LV end diastolic pressure is moderately elevated. LVEDP 30 mmHg.    Diagnostic Dominance: Right  Intervention     Patient Profile     71 y.o. 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   Assessment & Plan    NSTEMI -- High-sensitivity troponin peaked at 4829.  Underwent cardiac catheterization 12/1 with 30% prox RCA, 20% mid RCA, 90% ost RCA recommend staged PCI, 99% OM1 with L to L collaterals, 70% prox to mid LCx.  It was recommended to consider staged PCI of the RCA with medical therapy of the circumflex due to his renal function.  -- Underwent catheterization 12/4 with IVUS guided PCI of ostial/proximal RCA lesion with DES x1.  Recommendations for DAPT with aspirin/Plavix for at least 1 year -- CR following   CAP -- Per primary   ICM HFrEF -- Echocardiogram 12/1 with LVEF of 35 to 40%, akinesis of inferior lateral wall with moderate LV dysfunction, small  pericardial effusion, moderate LVH and grade 3 diastolic dysfunction -- LVEDP was 30 mmHg on cath 12/4, but diuresis has been held for rising creatinine -- GDMT limited with renal function: Continue Coreg, suspect he will need daily diuretic but would hold on starting until the weekend (lasix 40mg  daily) and plan for follow up labs on 12/11 in the office.    HTN -- borderline controlled -- continue coreg 25mg  BID, increase hydralazine 50 mg to TID   DM -- per primary -- no room for SGLT2i with GFR 19   CKD stage IIIb/IV -- Cr 2.93>>3.18>>3.29>>3.32 hopefully beginning to plateau at this point. Suspect he will need an additional day of monitoring prior to initiation of diuretic  -- follows with nephrologist in [place], asked that he arrange follow up in the next 2 weeks -- BMET in the office next week   Follow up arranged in the [place] office on 12/11  For questions or updates, please contact [place] Please consult [email or web address] for contact info under   Signed, Reino Bellis, NP  12/13/2021, 8:16 AM    Patient seen, examined. Available data reviewed. Agree with findings, assessment, and plan as outlined by Reino Bellis, NP.  The patient is independently interviewed and examined.  He is alert, oriented, no distress.  Lungs are clear, heart regular rate and rhythm no murmur gallop, abdomen soft nontender, extremities with no edema.  Agree with plans as outlined above.  Patient on appropriate  medical therapy.  I stressed the importance of continuing dual antiplatelet therapy with aspirin and clopidogrel for at least 1 year without interruption.  CKD4 appears stable.  Follow-up arranged as above.  Patient medically stable for discharge.  Appreciate hospitalist/TRH care of this patient.  Sherren Mocha, M.D. 12/13/2021 10:50 AM

## 2021-12-13 NOTE — Progress Notes (Signed)
CARDIAC REHAB PHASE I     Pt feeling well this am. Eager to get home. No questions or concerns this morning. CRP2 referral sent to [facility]. Plan for discharge home today.   [id number] Vanessa Barbara, RN BSN 12/13/2021 9:54 AM

## 2021-12-13 NOTE — Progress Notes (Signed)
Heart Failure Nurse Navigator Progress Note  PCP: Dettinger, Fransisca Kaufmann, MD PCP-Cardiologist: Gwenlyn Found Admission Diagnosis: None Admitted from: Home  Presentation:   [name] presented with shortness of breath, PNA, had LHC 12/1 found to have multivessel CAD with prior stents. Staged PCI to RCA on 12/10/21.   Patient was willing to listen to education on the sign and symptoms of heart failure, daily weights, Diet/ fluid restrictions, taking all medications as prescribed, attending all medical appointments. Patient stated he had no questions, a hospital Hf TOC appointment was scheduled for 12/24/21 @ 10 am.   ECHO/ LVEF: 35-40% G3DD  Clinical Course:  Past Medical History:  Diagnosis Date   CAD (coronary artery disease)    Chronic kidney disease    Diabetes mellitus (HCC)    Hyperkalemia    Hyperlipidemia    Hypertension    Ischemic cardiomyopathy    EF 30-35% in 2012   Proteinuria      Social History   Socioeconomic History   Marital status: Legally Separated    Spouse name: Brylen Wagar   Number of children: 1   Years of education: 12   Highest education level: 12th grade  Occupational History   Occupation: Retired  Tobacco Use   Smoking status: Every Day    Packs/day: 0.50    Years: 40.00    Total pack years: 20.00    Types: Cigarettes    Passive exposure: Current   Smokeless tobacco: Never   Tobacco comments:    down from 2 ppd  Vaping Use   Vaping Use: Never used  Substance and Sexual Activity   Alcohol use: Never   Drug use: Never   Sexual activity: Not Currently    Partners: Female  Other Topics Concern   Not on file  Social History Narrative   Lives alone. Lots of family nearby   Social Determinants of Health   Financial Resource Strain: Low Risk  (08/08/2021)   Overall Financial Resource Strain (CARDIA)    Difficulty of Paying Living Expenses: Not hard at all  Food Insecurity: No Food Insecurity (08/08/2021)   Hunger Vital Sign    Worried  About Running Out of Food in the Last Year: Never true    Ran Out of Food in the Last Year: Never true  Transportation Needs: No Transportation Needs (08/08/2021)   PRAPARE - Hydrologist (Medical): No    Lack of Transportation (Non-Medical): No  Physical Activity: Sufficiently Active (08/08/2021)   Exercise Vital Sign    Days of Exercise per Week: 5 days    Minutes of Exercise per Session: 60 min  Stress: No Stress Concern Present (08/08/2021)   [place]    Feeling of Stress : Only a little  Social Connections: Moderately Isolated (08/08/2021)   Social Connection and Isolation Panel [NHANES]    Frequency of Communication with Friends and Family: More than three times a week    Frequency of Social Gatherings with Friends and Family: More than three times a week    Attends Religious Services: Never    Marine scientist or Organizations: No    Attends Music therapist: Never    Marital Status: Married   Teacher, early years/pre and Provision:  Detailed education and instructions provided on heart failure disease management including the following:  Signs and symptoms of Heart Failure When to call the physician Importance of daily weights Low sodium diet Fluid restriction  Medication management Anticipated future follow-up appointments  Patient education given on each of the above topics.  Patient acknowledges understanding via teach back method and acceptance of all instructions.  Education Materials:  "Living Better With Heart Failure" Booklet, HF zone tool, & Daily Weight Tracker Tool.  Patient has scale at home: yes Patient has pill box at home: yes    High Risk Criteria for Readmission and/or Poor Patient Outcomes: Heart failure hospital admissions (last 6 months): 0  No Show rate: 6 % Difficult social situation: No Demonstrates medication adherence: yes Primary  Language: English Literacy level: Reading, writing, and comprehension.   Barriers of Care:   Continued education on HF Diet/ fluid restrictions  Considerations/Referrals:   Referral made to Heart Failure Pharmacist Stewardship: yes Referral made to Heart Failure CSW/NCM TOC: No Referral made to Heart & Vascular TOC clinic: Yes, 12/24/21 @ 10 am  Items for Follow-up on DC/TOC: Continued HF education Diet/ fluid restrictions   Earnestine Leys, BSN, RN Heart Failure Transport planner Only

## 2021-12-13 NOTE — Progress Notes (Signed)
   Heart Failure Stewardship Pharmacist Progress Note   PCP: Dettinger, Fransisca Kaufmann, MD PCP-Cardiologist: Quay Burow, MD    HPI:  71 yo M with PMH of CKD IIIb, T2DM, anemia, CHF, and HTN.   He presented to the ED on 11/30 with shortness of breath. CXR with R bibasilar PNA. ECHO 12/1 with LVEF 35-40%, akinesis of the inferolateral wall, regional wall motion abnormalities, G3DD, RV normal, small pericardial effusion. Taken for [facility] on 12/1 and found to have multivessel CAD with patent prior stents, but new lesion in RCA. Post cath chest discomfort. Staged PCI to RCA done on 12/4, LVEDP 30.  Discharge HF Medications: Diuretic: furosemide 40 mg daily Beta Blocker: carvedilol 25 mg BID Other: hydralazine 50 mg TID  Prior to admission HF Medications: Beta blocker: carvedilol 25 mg BID  Pertinent Lab Values: Serum creatinine 3.32, BUN 58, Potassium 4.3, Sodium 137, BNP 509.9, Magnesium 2.0, A1c 6.8  Vital Signs: Weight: 189 lbs (admission weight: 194 lbs) Blood pressure: 140/60s  Heart rate: 70-80s  I/O: not well documented yesterday; net -3.7L  Medication Assistance / Insurance Benefits Check: Does the patient have prescription insurance?  Yes Type of insurance plan: [facility] Medicare  Outpatient Pharmacy:  Prior to admission outpatient pharmacy: [facility] Is the patient willing to use [facility] at discharge? Yes Is the patient willing to transition their outpatient pharmacy to utilize a [facility] outpatient pharmacy?   Pending    Assessment: 1. Chronic systolic CHF (LVEF 49-44%), due to ICM. NYHA class II symptoms. - Not volume overloaded on exam, LVEDP elevated on cath. Agree with furosemide 40 mg daily (starting 12/9) on discharge. - Continue carvedilol 25 mg BID - No ACE/ARB/ARNI, MRA, or SGLT2i with advanced CKD. eGFR <20. - He takes tadalafil regularly at home. Caution nitrates. - Continue hydralazine 50 mg TID   Plan: 1) Medication changes recommended at this  time: - Discharge today  2) Patient assistance: - None pending  3)  Education  - Patient has been educated on current HF medications and potential additions to HF medication regimen - Patient verbalizes understanding that over the next few months, these medication doses may change and more medications may be added to optimize HF regimen - Patient has been educated on basic disease state pathophysiology and goals of therapy   Kerby Nora, PharmD, BCPS Heart Failure Stewardship Pharmacist Phone [phone]

## 2021-12-17 ENCOUNTER — Ambulatory Visit: Payer: Medicare Other | Admitting: Nurse Practitioner

## 2021-12-17 DIAGNOSIS — Z743 Need for continuous supervision: Secondary | ICD-10-CM | POA: Diagnosis not present

## 2021-12-17 DIAGNOSIS — I469 Cardiac arrest, cause unspecified: Secondary | ICD-10-CM | POA: Diagnosis not present

## 2021-12-24 ENCOUNTER — Encounter (HOSPITAL_COMMUNITY): Payer: Medicare Other

## 2022-01-07 NOTE — Progress Notes (Deleted)
Cardiology Office Note:    Date:  12/15/2021   ID:  Austin Weber, DOB [birth year], MRN [id number]  PCP:  Dettinger, Fransisca Kaufmann, MD   [facility] Providers Cardiologist:  Quay Burow, MD { Click to update primary MD,subspecialty MD or APP then REFRESH:1}    Referring MD: Dettinger, Fransisca Kaufmann, MD   No chief complaint on file. ***  History of Present Illness:    Austin Weber is a 72 y.o. male with a hx of ***  CAD, s/p NSTEMI (11-12/2021) HLD T2DM HTN CKD stage 3b-4 (follows Nephrology) Tobacco abuse  Pt is a 73 y.o. male with PMH as mentioned above. Presented to [facility] on 12/06/21 with acute respiratory distress, O2 Sats 78%.  Also presented with substernal chest pain, radiating to left arm.  Received 2 SL NG, resolved chest pain.  Was put on BiPAP and started to improve.  Previous cardiovascular history included MI in 1999, inferior MI's with PTCA and stenting to RCA with staged DES to LAD, previous HFrEF/ICM with EF 30 to 35% in 2012. EKG in ED revealed new LBBB w/ nonspecific STT changes, troponin was elevated, cardiology was consulted. BNP 509. Covid/flu negative.  Chest x-ray showed right basilar pneumonia. Received ABX for CAP. Dr. Alvester Chou who was consulted and saw patient CAD appears to be alternating between narrow complex rhythm and alternating axes, with possible PVC's vs intermittent RBBB.   At [facility] repeat echocardiogram revealed EF 30 to 40%, akinesis of inferolateral wall with moderate LV dysfunction, small pericardial effusion with mild RV diastolic collapse however IVC not dilated and collapses with inspiration, moderate LVH with grade 3 DD, moderate annular calcification, no other significant findings.  Underwent left heart cath on December 07, 2021 that revealed patent mid LAD stent, mild to moderate stenosis of proximal circumflex, mid circumflex had moderate stenosis beyond the obtuse marginal branch RCA had severe ostial stenosis with the mid vessel had a  patent stent, recommendation included considering staged PCI of ostial RCA and stated would treat circumflex disease medically.  3 days later he received successful drug-eluting stent to RCA.  DAPT recommended for least 12 months with aspirin and Plavix with aggressive secondary prevention recommended.  Medical therapy of left coronary artery disease recommended. Was recommended to obtain BMET at follow up visit. Was d/c in stable condition on 12/13/21.   Today he presents for follow-up visit after cardiac catheterization. He states ***  Past Medical History:  Diagnosis Date   CAD (coronary artery disease)    Chronic kidney disease    Diabetes mellitus ([place])    Hyperkalemia    Hyperlipidemia    Hypertension    Ischemic cardiomyopathy    EF 30-35% in 2012   Proteinuria     Past Surgical History:  Procedure Laterality Date   CORONARY STENT INTERVENTION N/A 12/10/2021   Procedure: CORONARY STENT INTERVENTION;  Surgeon: Nelva Bush, MD;  Location: [place] CV [facility];  Service: Cardiovascular;  Laterality: N/A;   INTRAVASCULAR ULTRASOUND/IVUS N/A 12/10/2021   Procedure: Intravascular Ultrasound/IVUS;  Surgeon: Nelva Bush, MD;  Location: [place] [facility];  Service: Cardiovascular;  Laterality: N/A;   LEFT HEART CATH AND CORONARY ANGIOGRAPHY N/A 12/07/2021   Procedure: LEFT HEART CATH AND CORONARY ANGIOGRAPHY;  Surgeon: Burnell Blanks, MD;  Location: [place] [facility];  Service: Cardiovascular;  Laterality: N/A;   LEFT HEART CATH AND CORONARY ANGIOGRAPHY N/A 12/10/2021   Procedure: LEFT HEART CATH AND CORONARY ANGIOGRAPHY;  Surgeon: Nelva Bush, MD;  Location: [place]  INVASIVE CV LAB;  Service: Cardiovascular;  Laterality: N/A;   SHOULDER SURGERY Left     Current Medications: No outpatient medications have been marked as taking for the Dec 30, 2021 encounter (Appointment) with Finis Bud, NP.     Allergies:   Nitroglycerin   Social History   Socioeconomic History    Marital status: Legally Separated    Spouse name: Jonpaul Lumm   Number of children: 1   Years of education: 12   Highest education level: 12th grade  Occupational History   Occupation: Retired  Tobacco Use   Smoking status: Every Day    Packs/day: 0.50    Years: 40.00    Total pack years: 20.00    Types: Cigarettes    Passive exposure: Current   Smokeless tobacco: Never   Tobacco comments:    down from 2 ppd  Vaping Use   Vaping Use: Never used  Substance and Sexual Activity   Alcohol use: Never   Drug use: Never   Sexual activity: Not Currently    Partners: Female  Other Topics Concern   Not on file  Social History Narrative   Lives alone. Lots of family nearby   Social Determinants of Health   Financial Resource Strain: Low Risk  (12/13/2021)   Overall Financial Resource Strain (CARDIA)    Difficulty of Paying Living Expenses: Not very hard  Food Insecurity: No Food Insecurity (12/13/2021)   Hunger Vital Sign    Worried About Running Out of Food in the Last Year: Never true    Ran Out of Food in the Last Year: Never true  Transportation Needs: No Transportation Needs (12/13/2021)   PRAPARE - Hydrologist (Medical): No    Lack of Transportation (Non-Medical): No  Physical Activity: Sufficiently Active (08/08/2021)   Exercise Vital Sign    Days of Exercise per Week: 5 days    Minutes of Exercise per Session: 60 min  Stress: No Stress Concern Present (08/08/2021)   [place]    Feeling of Stress : Only a little  Social Connections: Moderately Isolated (08/08/2021)   Social Connection and Isolation Panel [NHANES]    Frequency of Communication with Friends and Family: More than three times a week    Frequency of Social Gatherings with Friends and Family: More than three times a week    Attends Religious Services: Never    Marine scientist or Organizations: No    Attends  Archivist Meetings: Never    Marital Status: Married     Family History: The patient's ***family history includes Heart disease in his father.  ROS:   Please see the history of present illness.    *** All other systems reviewed and are negative.  EKGs/Labs/Other Studies Reviewed:    The following studies were reviewed today: ***  EKG:  EKG is *** ordered today.  The ekg ordered today demonstrates ***  Recent Labs: 12/06/2021: B Natriuretic Peptide 509.9 12/07/2021: ALT 20; Magnesium 2.0; TSH 2.371 12/13/2021: BUN 58; Creatinine, Ser 3.32; Hemoglobin 9.1; Platelets 154; Potassium 4.3; Sodium 137  Recent Lipid Panel    Component Value Date/Time   CHOL 134 12/07/2021 0851   CHOL 128 10/17/2021 0837   TRIG 52 12/07/2021 0851   HDL 43 12/07/2021 0851   HDL 39 (L) 10/17/2021 0837   CHOLHDL 3.1 12/07/2021 0851   VLDL 10 12/07/2021 0851   LDLCALC 81 12/07/2021 0851   LDLCALC  73 10/17/2021 0837     Risk Assessment/Calculations:   {Does this patient have ATRIAL FIBRILLATION?:[phone]}  No BP recorded.  {Refresh Note OR Click here to enter BP  :1}***         Physical Exam:    VS:  There were no vitals taken for this visit.    Wt Readings from Last 3 Encounters:  12/11/21 189 lb 6 oz (85.9 kg)  10/17/21 204 lb (92.5 kg)  06/11/21 201 lb (91.2 kg)     GEN: *** Well nourished, well developed in no acute distress HEENT: Normal NECK: No JVD; No carotid bruits LYMPHATICS: No lymphadenopathy CARDIAC: ***RRR, no murmurs, rubs, gallops RESPIRATORY:  Clear to auscultation without rales, wheezing or rhonchi  ABDOMEN: Soft, non-tender, non-distended MUSCULOSKELETAL:  No edema; No deformity  SKIN: Warm and dry NEUROLOGIC:  Alert and oriented x 3 PSYCHIATRIC:  Normal affect   ASSESSMENT:    No diagnosis found. PLAN:    In order of problems listed above:  ***  {The patient has an active order for outpatient cardiac rehabilitation.   Please indicate if the  patient is ready to start. Do NOT delete this.  It will auto delete.  Refresh note, then sign.              Click here to document readiness and see contraindications.  :1}  Cardiac Rehabilitation Eligibility Assessment       {Are you ordering a CV Procedure (e.g. stress test, cath, DCCV, TEE, etc)?   Press F2        :[id number]}    Medication Adjustments/Labs and Tests Ordered: Current medicines are reviewed at length with the patient today.  Concerns regarding medicines are outlined above.  No orders of the defined types were placed in this encounter.  No orders of the defined types were placed in this encounter.   There are no Patient Instructions on file for this visit.   SignedFinis Bud, NP  12/15/2021 12:08 PM    [place]

## 2022-01-07 DEATH — deceased

## 2022-01-09 ENCOUNTER — Ambulatory Visit: Payer: Medicare Other | Admitting: Nurse Practitioner

## 2022-02-15 ENCOUNTER — Ambulatory Visit: Payer: Medicare Other | Admitting: Family Medicine

## 2022-08-17 IMAGING — MR MR ABDOMEN WO/W CM
20 series · 48 of 48 positions shown · IV contrast (10 ml Gadavist)
Comparison: Abdominal ultrasound, 02/11/2020

CLINICAL DATA: Characterize right renal lesion identified by
ultrasound

EXAM:
MRI ABDOMEN WITHOUT AND WITH CONTRAST
TECHNIQUE: Multiplanar multisequence MR imaging of the abdomen was performed
both before and after the administration of intravenous contrast.
CONTRAST:  10mL GADAVIST GADOBUTROL 1 MMOL/ML IV SOLN

[Series 3: cor haste · coronal · 6.0mm · 1.25mm/px · 2 of 32 slices shown]
[im 1/32]
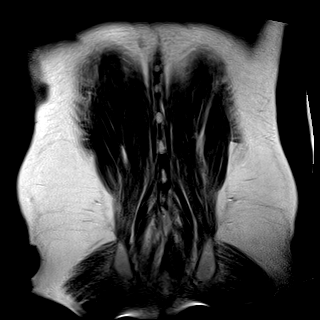
[im 32/32]
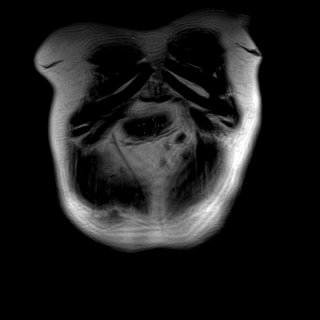

[Series 6: T2 fat-sat · axial · 6.0mm · 1.25mm/px · z∈[-153,+20]mm · 2 of 25 slices shown]
[im 1/25]
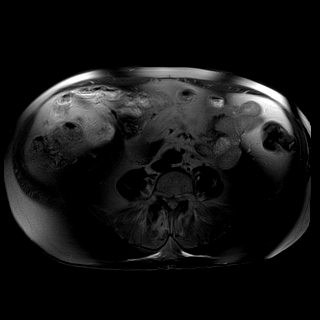
[im 25/25]
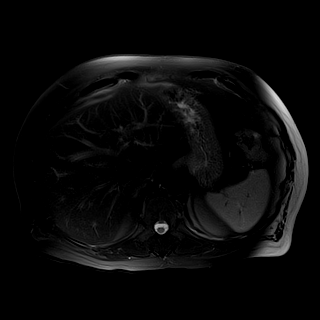

[Series 7: DWI · axial · 6.0mm · 1.42mm/px · z∈[-171,+38]mm · 2 of 30 slices shown (1 of 4)]
[im 1/30]
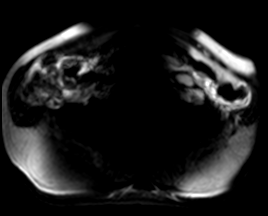
[im 30/30]
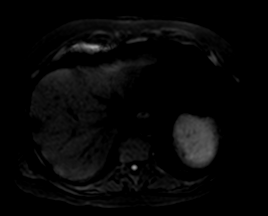

[Series 7: DWI · axial · 6.0mm · 1.42mm/px · 1 of 30 slices shown (2 of 4)]
[im 1/30]
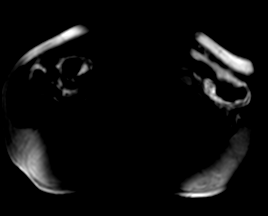

[Series 7: DWI · axial · 6.0mm · 1.42mm/px · 1 of 30 slices shown (3 of 4)]
[im 1/30]
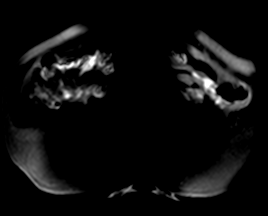

[Series 8: DWI · axial · 6.0mm · 1.42mm/px · 1 of 30 slices shown (4 of 4)]
[im 1/30]
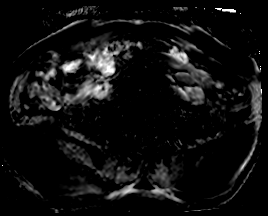

[Series 9: bSSFP · axial · 6.0mm · 0.74mm/px · z∈[-207,+75]mm · 2 of 48 slices shown]
[im 1/48]
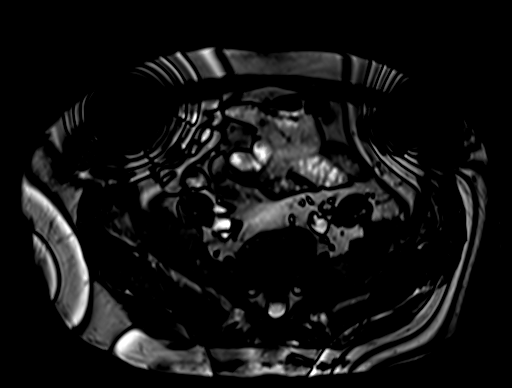
[im 48/48]
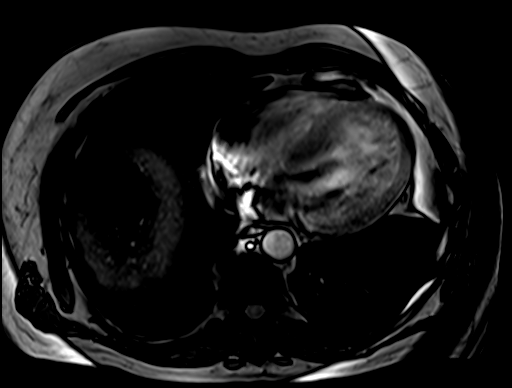

[Series 10: ax in and · axial · 3.0mm · 1.25mm/px · z∈[-159,+30]mm · 3 of 64 slices shown (1 of 2)]
[im 1/64]
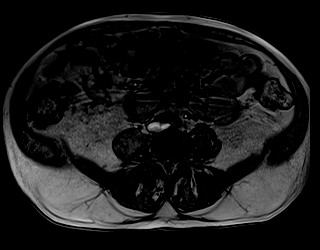
[im 32/64]
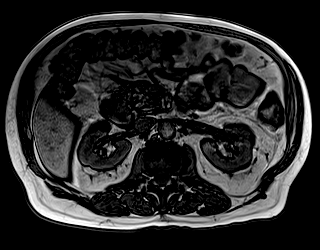
[im 64/64]
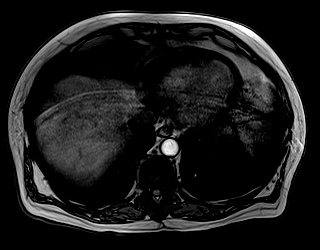

[Series 11: ax in and · axial · 3.0mm · 1.25mm/px · z∈[-159,+30]mm · 3 of 64 slices shown (2 of 2)]
[im 1/64]
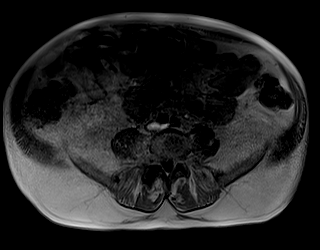
[im 32/64]
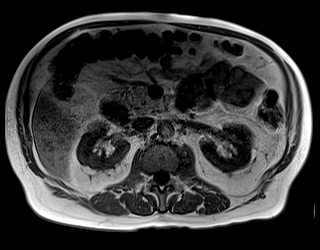
[im 64/64]
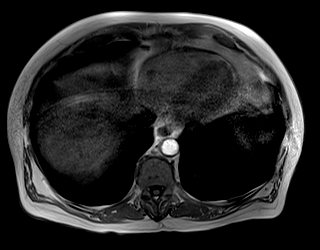

[Series 12: ax haste bh · axial · 6.0mm · 1.19mm/px · 1 of 30 slices shown]
[im 1/30]
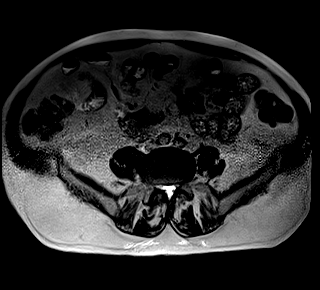

[Series 13: T1 dynamic · axial · 3.0mm · 1.25mm/px · z∈[-159,+30]mm · 3 of 64 slices shown (1 of 9)]
[im 1/64]
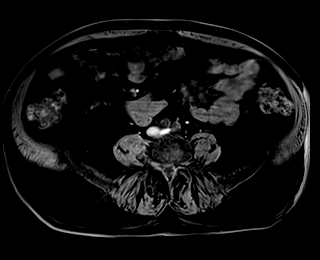
[im 32/64]
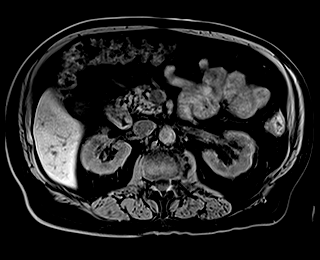
[im 64/64]
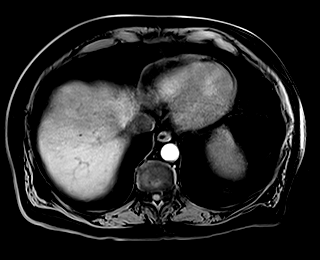

[Series 15: T1 dynamic · axial · 3.0mm · 1.25mm/px · z∈[-159,+30]mm · 3 of 64 slices shown (2 of 9)]
[im 1/64]
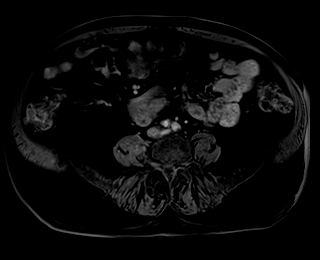
[im 32/64]
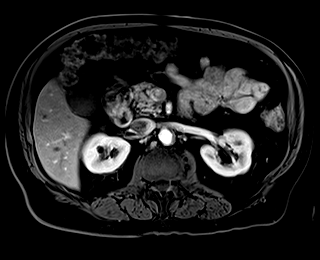
[im 64/64]
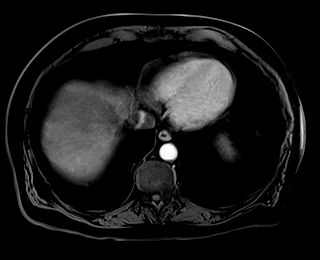

[Series 16: T1 dynamic · axial · 3.0mm · 1.25mm/px · z∈[-159,+30]mm · 3 of 64 slices shown (3 of 9)]
[im 1/64]
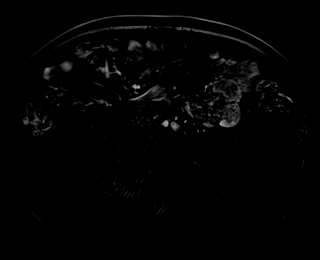
[im 32/64]
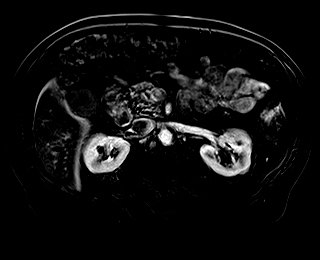
[im 64/64]
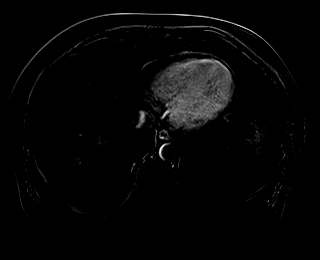

[Series 17: T1 dynamic · axial · 3.0mm · 1.25mm/px · z∈[-159,+30]mm · 3 of 64 slices shown (4 of 9)]
[im 1/64]
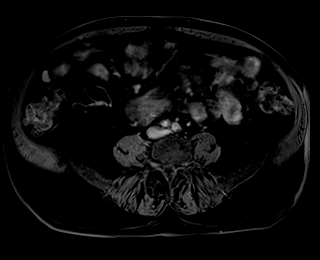
[im 32/64]
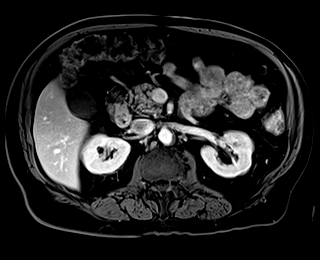
[im 64/64]
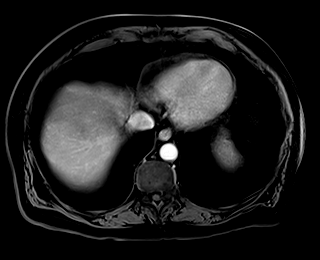

[Series 18: T1 dynamic · axial · 3.0mm · 1.25mm/px · z∈[-159,+30]mm · 3 of 64 slices shown (5 of 9)]
[im 1/64]
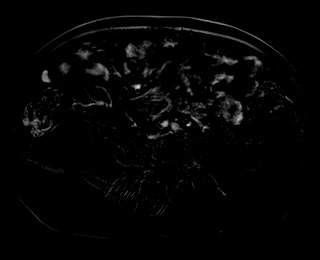
[im 32/64]
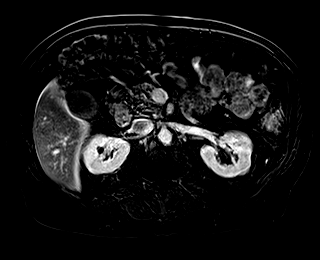
[im 64/64]
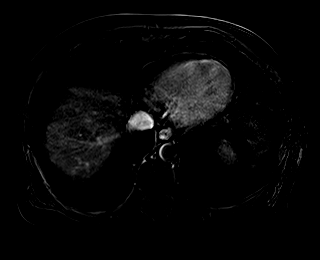

[Series 19: T1 dynamic · axial · 3.0mm · 1.25mm/px · z∈[-159,+30]mm · 3 of 64 slices shown (6 of 9)]
[im 1/64]
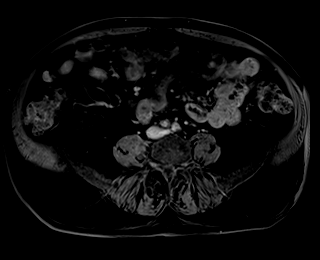
[im 32/64]
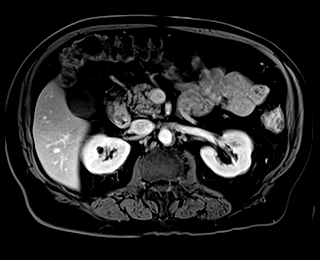
[im 64/64]
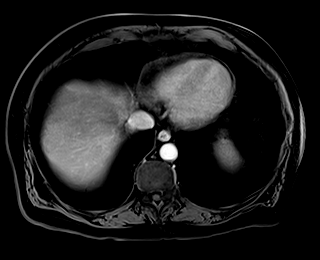

[Series 20: T1 dynamic · axial · 3.0mm · 1.25mm/px · z∈[-159,+30]mm · 3 of 64 slices shown (7 of 9)]
[im 1/64]
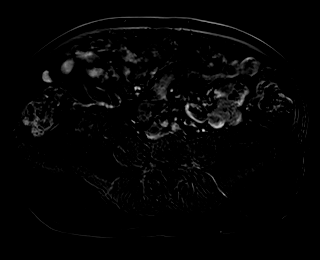
[im 32/64]
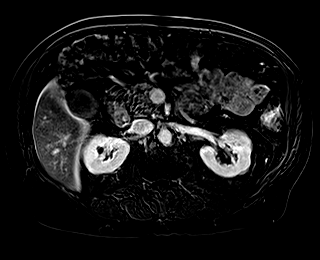
[im 64/64]
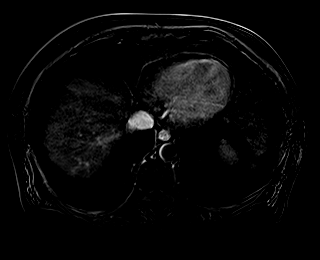

[Series 21: T1 dynamic · axial · 3.0mm · 1.25mm/px · z∈[-159,+30]mm · 3 of 64 slices shown (8 of 9)]
[im 1/64]
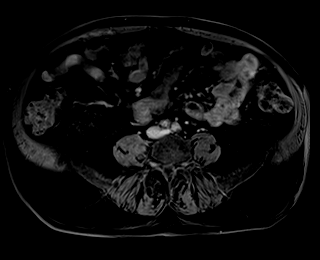
[im 32/64]
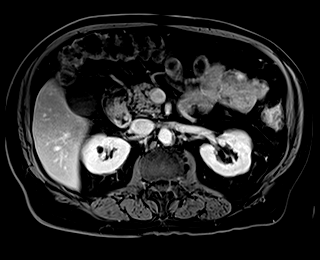
[im 64/64]
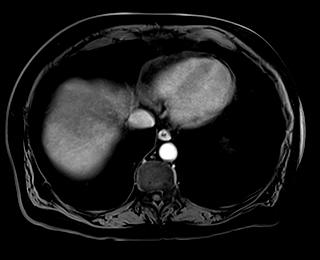

[Series 22: T1 dynamic · axial · 3.0mm · 1.25mm/px · z∈[-159,+30]mm · 3 of 64 slices shown (9 of 9)]
[im 1/64]
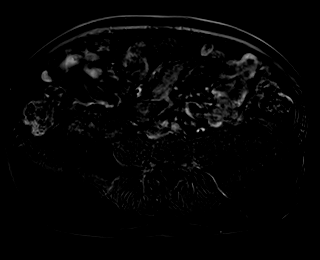
[im 32/64]
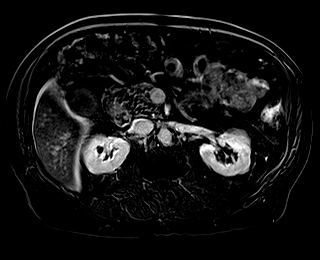
[im 64/64]
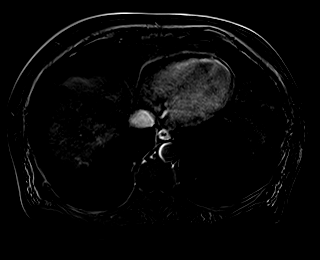

[Series 23: T1 dynamic post-contrast · coronal · 3.0mm · 1.31mm/px · 3 of 72 slices shown]
[im 1/72]
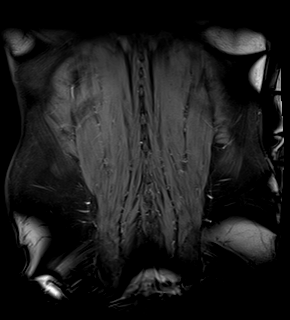
[im 36/72]
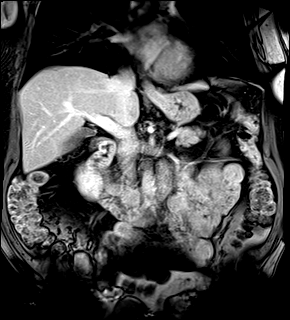
[im 72/72]
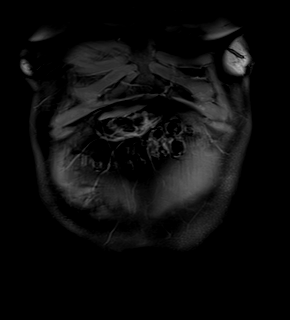

[48 of 48 positions shown; findings below may reference images not displayed]

FINDINGS: Lower chest: No acute findings.

Hepatobiliary: No mass or other parenchymal abnormality identified.

Pancreas: No mass, inflammatory changes, or other parenchymal
abnormality identified.

Spleen:  Within normal limits in size and appearance.

Adrenals/Urinary Tract: No masses identified. There is no signal
abnormality or abnormal contrast enhancement to correspond to a
possible mass of the lateral midportion of the right kidney
identified on prior ultrasound, favored to represent a prominent
cortical lobulation. Incidental, definitively benign simple cyst of
the superior pole of the right kidney. No evidence of
hydronephrosis.

Stomach/Bowel: Visualized portions within the abdomen are
unremarkable.

Vascular/Lymphatic: No pathologically enlarged lymph nodes
identified. Aortic atherosclerosis. No abdominal aortic aneurysm
demonstrated.

Other:  None.

Musculoskeletal: No suspicious bone lesions identified.
IMPRESSION: 1. No signal abnormality or abnormal contrast enhancement to
correspond to a possible mass of the lateral midportion of the right
kidney identified on prior ultrasound, favored to represent a
prominent cortical lobulation.
2. Incidental, definitively benign simple cyst of the superior pole
of the right kidney.

Aortic Atherosclerosis ([id number]).
